# Patient Record
Sex: Male | Born: 1935 | Race: White | Hispanic: No | Marital: Married | Smoking: Former smoker
Health system: Southern US, Community
[De-identification: ages and names within clinical notes are randomized; demographics above are authoritative.]

## PROBLEM LIST (undated history)

## (undated) DIAGNOSIS — G4730 Sleep apnea, unspecified: Secondary | ICD-10-CM

## (undated) DIAGNOSIS — I2510 Atherosclerotic heart disease of native coronary artery without angina pectoris: Secondary | ICD-10-CM

## (undated) DIAGNOSIS — H9190 Unspecified hearing loss, unspecified ear: Secondary | ICD-10-CM

## (undated) DIAGNOSIS — T8859XA Other complications of anesthesia, initial encounter: Secondary | ICD-10-CM

## (undated) DIAGNOSIS — M8448XA Pathological fracture, other site, initial encounter for fracture: Secondary | ICD-10-CM

## (undated) DIAGNOSIS — C9000 Multiple myeloma not having achieved remission: Secondary | ICD-10-CM

## (undated) DIAGNOSIS — I6529 Occlusion and stenosis of unspecified carotid artery: Secondary | ICD-10-CM

## (undated) DIAGNOSIS — T4145XA Adverse effect of unspecified anesthetic, initial encounter: Secondary | ICD-10-CM

## (undated) DIAGNOSIS — F341 Dysthymic disorder: Secondary | ICD-10-CM

## (undated) DIAGNOSIS — I10 Essential (primary) hypertension: Secondary | ICD-10-CM

## (undated) DIAGNOSIS — E119 Type 2 diabetes mellitus without complications: Secondary | ICD-10-CM

## (undated) DIAGNOSIS — J189 Pneumonia, unspecified organism: Secondary | ICD-10-CM

## (undated) DIAGNOSIS — N200 Calculus of kidney: Secondary | ICD-10-CM

## (undated) DIAGNOSIS — L719 Rosacea, unspecified: Secondary | ICD-10-CM

## (undated) DIAGNOSIS — M109 Gout, unspecified: Secondary | ICD-10-CM

## (undated) HISTORY — PX: APPENDECTOMY: SHX54

## (undated) HISTORY — PX: BACK SURGERY: SHX140

## (undated) HISTORY — DX: Multiple myeloma not having achieved remission: C90.00

## (undated) HISTORY — DX: Pathological fracture, other site, initial encounter for fracture: M84.48XA

## (undated) HISTORY — PX: COLONOSCOPY: SHX174

## (undated) HISTORY — PX: CATARACT EXTRACTION: SUR2

## (undated) HISTORY — PX: CARDIAC CATHETERIZATION: SHX172

## (undated) HISTORY — PX: COLONOSCOPY W/ BIOPSIES AND POLYPECTOMY: SHX1376

## (undated) HISTORY — DX: Dysthymic disorder: F34.1

## (undated) HISTORY — DX: Type 2 diabetes mellitus without complications: E11.9

## (undated) HISTORY — PX: TONSILLECTOMY: SUR1361

## (undated) HISTORY — DX: Atherosclerotic heart disease of native coronary artery without angina pectoris: I25.10

## (undated) HISTORY — DX: Occlusion and stenosis of unspecified carotid artery: I65.29

## (undated) HISTORY — DX: Rosacea, unspecified: L71.9

---

## 1961-01-28 HISTORY — PX: HERNIA REPAIR: SHX51

## 1990-01-28 HISTORY — PX: ELBOW SURGERY: SHX618

## 1997-10-13 ENCOUNTER — Encounter: Payer: Self-pay | Admitting: Emergency Medicine

## 1997-10-13 ENCOUNTER — Emergency Department (HOSPITAL_COMMUNITY): Admission: EM | Admit: 1997-10-13 | Discharge: 1997-10-13 | Payer: Self-pay | Admitting: Emergency Medicine

## 1997-10-14 ENCOUNTER — Ambulatory Visit (HOSPITAL_COMMUNITY): Admission: RE | Admit: 1997-10-14 | Discharge: 1997-10-14 | Payer: Self-pay | Admitting: Emergency Medicine

## 2001-01-28 HISTORY — PX: ROTATOR CUFF REPAIR: SHX139

## 2001-07-27 ENCOUNTER — Ambulatory Visit (HOSPITAL_COMMUNITY): Admission: RE | Admit: 2001-07-27 | Discharge: 2001-07-27 | Payer: Self-pay | Admitting: Orthopedic Surgery

## 2001-07-27 ENCOUNTER — Encounter: Payer: Self-pay | Admitting: Orthopedic Surgery

## 2001-09-23 ENCOUNTER — Encounter: Payer: Self-pay | Admitting: Orthopedic Surgery

## 2001-09-30 ENCOUNTER — Observation Stay (HOSPITAL_COMMUNITY): Admission: RE | Admit: 2001-09-30 | Discharge: 2001-10-01 | Payer: Self-pay | Admitting: Orthopedic Surgery

## 2001-12-16 ENCOUNTER — Ambulatory Visit (HOSPITAL_COMMUNITY): Admission: RE | Admit: 2001-12-16 | Discharge: 2001-12-16 | Payer: Self-pay | Admitting: Gastroenterology

## 2001-12-16 ENCOUNTER — Encounter (INDEPENDENT_AMBULATORY_CARE_PROVIDER_SITE_OTHER): Payer: Self-pay | Admitting: Specialist

## 2002-01-28 HISTORY — PX: CAROTID ARTERY - SUBCLAVIAN ARTERY BYPASS GRAFT: SUR178

## 2002-08-10 ENCOUNTER — Encounter: Payer: Self-pay | Admitting: Vascular Surgery

## 2002-08-12 ENCOUNTER — Inpatient Hospital Stay (HOSPITAL_COMMUNITY): Admission: RE | Admit: 2002-08-12 | Discharge: 2002-08-13 | Payer: Self-pay | Admitting: Vascular Surgery

## 2002-08-12 ENCOUNTER — Encounter: Payer: Self-pay | Admitting: Vascular Surgery

## 2002-08-12 ENCOUNTER — Encounter (INDEPENDENT_AMBULATORY_CARE_PROVIDER_SITE_OTHER): Payer: Self-pay | Admitting: *Deleted

## 2002-09-27 ENCOUNTER — Encounter: Payer: Self-pay | Admitting: Physical Medicine and Rehabilitation

## 2002-09-27 ENCOUNTER — Encounter
Admission: RE | Admit: 2002-09-27 | Discharge: 2002-09-27 | Payer: Self-pay | Admitting: Physical Medicine and Rehabilitation

## 2003-07-19 ENCOUNTER — Ambulatory Visit (HOSPITAL_COMMUNITY): Admission: RE | Admit: 2003-07-19 | Discharge: 2003-07-19 | Payer: Self-pay | Admitting: Specialist

## 2004-01-29 HISTORY — PX: JOINT REPLACEMENT: SHX530

## 2005-05-29 ENCOUNTER — Encounter: Payer: Self-pay | Admitting: Emergency Medicine

## 2005-07-03 ENCOUNTER — Inpatient Hospital Stay (HOSPITAL_COMMUNITY): Admission: RE | Admit: 2005-07-03 | Discharge: 2005-07-08 | Payer: Self-pay | Admitting: Orthopedic Surgery

## 2005-07-04 ENCOUNTER — Ambulatory Visit: Payer: Self-pay | Admitting: Physical Medicine & Rehabilitation

## 2006-09-03 ENCOUNTER — Ambulatory Visit: Payer: Self-pay | Admitting: Internal Medicine

## 2007-09-18 ENCOUNTER — Encounter: Admission: RE | Admit: 2007-09-18 | Discharge: 2007-09-18 | Payer: Self-pay | Admitting: Orthopedic Surgery

## 2007-10-29 ENCOUNTER — Inpatient Hospital Stay (HOSPITAL_COMMUNITY): Admission: RE | Admit: 2007-10-29 | Discharge: 2007-10-31 | Payer: Self-pay | Admitting: Orthopedic Surgery

## 2008-10-06 ENCOUNTER — Encounter: Admission: RE | Admit: 2008-10-06 | Discharge: 2008-10-06 | Payer: Self-pay | Admitting: Orthopedic Surgery

## 2009-07-18 ENCOUNTER — Emergency Department (HOSPITAL_COMMUNITY): Admission: EM | Admit: 2009-07-18 | Discharge: 2009-07-18 | Payer: Self-pay | Admitting: Emergency Medicine

## 2009-07-28 ENCOUNTER — Inpatient Hospital Stay (HOSPITAL_COMMUNITY): Admission: AD | Admit: 2009-07-28 | Discharge: 2009-08-03 | Payer: Self-pay | Admitting: Orthopedic Surgery

## 2009-09-09 ENCOUNTER — Emergency Department (HOSPITAL_COMMUNITY)
Admission: EM | Admit: 2009-09-09 | Discharge: 2009-09-10 | Payer: Self-pay | Source: Home / Self Care | Admitting: Emergency Medicine

## 2009-09-11 ENCOUNTER — Encounter: Admission: RE | Admit: 2009-09-11 | Discharge: 2009-09-11 | Payer: Self-pay | Admitting: Orthopedic Surgery

## 2009-09-14 ENCOUNTER — Encounter: Admission: RE | Admit: 2009-09-14 | Discharge: 2009-09-14 | Payer: Self-pay | Admitting: Orthopedic Surgery

## 2009-09-26 ENCOUNTER — Ambulatory Visit: Payer: Self-pay | Admitting: Oncology

## 2009-10-13 LAB — CBC WITH DIFFERENTIAL/PLATELET
LYMPH%: 25.4 % (ref 14.0–49.0)
MCHC: 34.2 g/dL (ref 32.0–36.0)
MCV: 95.1 fL (ref 79.3–98.0)
MONO%: 7.7 % (ref 0.0–14.0)
Platelets: 170 10*3/uL (ref 140–400)
RDW: 15.2 % — ABNORMAL HIGH (ref 11.0–14.6)

## 2009-10-17 LAB — SPEP & IFE WITH QIG
Albumin ELP: 54.3 % — ABNORMAL LOW (ref 55.8–66.1)
Alpha-1-Globulin: 7 % — ABNORMAL HIGH (ref 2.9–4.9)
Alpha-2-Globulin: 14.5 % — ABNORMAL HIGH (ref 7.1–11.8)
Beta 2: 2.8 % — ABNORMAL LOW (ref 3.2–6.5)
Beta Globulin: 5.8 % (ref 4.7–7.2)
Gamma Globulin: 15.6 % (ref 11.1–18.8)
IgA: 47 mg/dL — ABNORMAL LOW (ref 68–378)
IgG (Immunoglobin G), Serum: 1290 mg/dL (ref 694–1618)
IgM, Serum: 29 mg/dL — ABNORMAL LOW (ref 60–263)
M-Spike, %: 0.65 g/dL
Total Protein, Serum Electrophoresis: 6.2 g/dL (ref 6.0–8.3)

## 2009-10-17 LAB — COMPREHENSIVE METABOLIC PANEL
ALT: 13 U/L (ref 0–53)
AST: 15 U/L (ref 0–37)
Albumin: 3.7 g/dL (ref 3.5–5.2)
Alkaline Phosphatase: 80 U/L (ref 39–117)
BUN: 21 mg/dL (ref 6–23)
CO2: 26 mEq/L (ref 19–32)
Calcium: 9.9 mg/dL (ref 8.4–10.5)
Chloride: 104 mEq/L (ref 96–112)
Creatinine, Ser: 0.83 mg/dL (ref 0.40–1.50)
Glucose, Bld: 131 mg/dL — ABNORMAL HIGH (ref 70–99)
Potassium: 3.9 mEq/L (ref 3.5–5.3)
Sodium: 141 mEq/L (ref 135–145)
Total Bilirubin: 0.5 mg/dL (ref 0.3–1.2)
Total Protein: 6.2 g/dL (ref 6.0–8.3)

## 2009-10-17 LAB — KAPPA/LAMBDA LIGHT CHAINS
Kappa free light chain: 0.81 mg/dL (ref 0.33–1.94)
Kappa:Lambda Ratio: 1.21 (ref 0.26–1.65)

## 2009-10-17 LAB — LACTATE DEHYDROGENASE: LDH: 104 U/L (ref 94–250)

## 2009-10-27 ENCOUNTER — Ambulatory Visit: Payer: Self-pay | Admitting: Oncology

## 2009-10-27 ENCOUNTER — Encounter: Payer: Self-pay | Admitting: Internal Medicine

## 2010-01-24 ENCOUNTER — Ambulatory Visit: Payer: Self-pay | Admitting: Oncology

## 2010-01-28 HISTORY — PX: CORONARY ARTERY BYPASS GRAFT: SHX141

## 2010-01-30 ENCOUNTER — Ambulatory Visit: Payer: Self-pay | Admitting: Oncology

## 2010-01-30 ENCOUNTER — Encounter
Admission: RE | Admit: 2010-01-30 | Discharge: 2010-01-30 | Payer: Self-pay | Source: Home / Self Care | Attending: Oncology | Admitting: Oncology

## 2010-01-30 LAB — CBC WITH DIFFERENTIAL/PLATELET
Basophils Absolute: 0.1 10*3/uL (ref 0.0–0.1)
EOS%: 1.9 % (ref 0.0–7.0)
Eosinophils Absolute: 0.1 10*3/uL (ref 0.0–0.5)
HCT: 43.8 % (ref 38.4–49.9)
LYMPH%: 21.4 % (ref 14.0–49.0)
MCH: 32.2 pg (ref 27.2–33.4)
MCV: 93.8 fL (ref 79.3–98.0)
MONO%: 8 % (ref 0.0–14.0)
Platelets: 189 10*3/uL (ref 140–400)
lymph#: 1.6 10*3/uL (ref 0.9–3.3)

## 2010-01-30 LAB — COMPREHENSIVE METABOLIC PANEL
AST: 25 U/L (ref 0–37)
Albumin: 3.5 g/dL (ref 3.5–5.2)
Alkaline Phosphatase: 72 U/L (ref 39–117)
BUN: 14 mg/dL (ref 6–23)
Creatinine, Ser: 0.91 mg/dL (ref 0.40–1.50)
Potassium: 4.1 mEq/L (ref 3.5–5.3)
Total Bilirubin: 0.7 mg/dL (ref 0.3–1.2)
Total Protein: 6.8 g/dL (ref 6.0–8.3)

## 2010-02-01 LAB — SPEP & IFE WITH QIG
Albumin ELP: 54.9 % — ABNORMAL LOW (ref 55.8–66.1)
Alpha-1-Globulin: 3.8 % (ref 2.9–4.9)
Alpha-2-Globulin: 12 % — ABNORMAL HIGH (ref 7.1–11.8)
Beta 2: 3.7 % (ref 3.2–6.5)
Beta Globulin: 5.4 % (ref 4.7–7.2)
Gamma Globulin: 20.2 % — ABNORMAL HIGH (ref 11.1–18.8)
IgA: 55 mg/dL — ABNORMAL LOW (ref 68–378)
IgG (Immunoglobin G), Serum: 1620 mg/dL — ABNORMAL HIGH (ref 694–1618)
IgM, Serum: 28 mg/dL — ABNORMAL LOW (ref 60–263)
M-Spike, %: 0.84 g/dL
Total Protein, Serum Electrophoresis: 7.1 g/dL (ref 6.0–8.3)

## 2010-02-01 LAB — KAPPA/LAMBDA LIGHT CHAINS
Kappa free light chain: 1.16 mg/dL (ref 0.33–1.94)
Lambda Free Lght Chn: <0.47 mg/dL — ABNORMAL LOW (ref 0.57–2.63)

## 2010-02-01 LAB — URIC ACID: Uric Acid, Serum: 5.2 mg/dL (ref 4.0–7.8)

## 2010-02-01 LAB — LACTATE DEHYDROGENASE: LDH: 149 U/L (ref 94–250)

## 2010-02-13 ENCOUNTER — Ambulatory Visit (HOSPITAL_COMMUNITY)
Admission: RE | Admit: 2010-02-13 | Discharge: 2010-02-13 | Payer: Self-pay | Source: Home / Self Care | Attending: Oncology | Admitting: Oncology

## 2010-02-14 LAB — POCT I-STAT, CHEM 8
BUN: 24 mg/dL — ABNORMAL HIGH (ref 6–23)
Calcium, Ion: 1.04 mmol/L — ABNORMAL LOW (ref 1.12–1.32)
Chloride: 104 mEq/L (ref 96–112)
Creatinine, Ser: 0.9 mg/dL (ref 0.4–1.5)
Glucose, Bld: 116 mg/dL — ABNORMAL HIGH (ref 70–99)
HCT: 47 % (ref 39.0–52.0)
Hemoglobin: 16 g/dL (ref 13.0–17.0)
Potassium: 4.3 mEq/L (ref 3.5–5.1)
Sodium: 137 mEq/L (ref 135–145)
TCO2: 30 mmol/L (ref 0–100)

## 2010-02-14 LAB — CBC
HCT: 45.1 % (ref 39.0–52.0)
Hemoglobin: 15.7 g/dL (ref 13.0–17.0)
MCH: 31.7 pg (ref 26.0–34.0)
MCHC: 34.8 g/dL (ref 30.0–36.0)
MCV: 91.1 fL (ref 78.0–100.0)
Platelets: 176 10*3/uL (ref 150–400)
RBC: 4.95 MIL/uL (ref 4.22–5.81)
RDW: 14 % (ref 11.5–15.5)
WBC: 5.7 10*3/uL (ref 4.0–10.5)

## 2010-02-14 LAB — PROTIME-INR
INR: 0.93 (ref 0.00–1.49)
Prothrombin Time: 12.7 seconds (ref 11.6–15.2)

## 2010-02-14 LAB — APTT: aPTT: 25 seconds (ref 24–37)

## 2010-02-22 ENCOUNTER — Other Ambulatory Visit: Payer: Self-pay | Admitting: Oncology

## 2010-02-22 DIAGNOSIS — D472 Monoclonal gammopathy: Secondary | ICD-10-CM

## 2010-02-27 NOTE — Letter (Signed)
Summary: [facility]  [facility]   Imported By: Lennie Odor 11/20/2009 15:39:16  _____________________________________________________________________  External Attachment:    Type:   Image     Comment:   External Document

## 2010-03-16 ENCOUNTER — Ambulatory Visit
Admission: RE | Admit: 2010-03-16 | Discharge: 2010-03-16 | Disposition: A | Discharge status: Home / Self Care | Payer: BC Managed Care – PPO | Source: Ambulatory Visit | Attending: Family Medicine | Admitting: Family Medicine

## 2010-03-16 ENCOUNTER — Other Ambulatory Visit: Payer: Self-pay | Admitting: Family Medicine

## 2010-03-16 DIAGNOSIS — R2230 Localized swelling, mass and lump, unspecified upper limb: Secondary | ICD-10-CM

## 2010-04-05 ENCOUNTER — Inpatient Hospital Stay (HOSPITAL_COMMUNITY)
Admission: RE | Admit: 2010-04-05 | Discharge: 2010-04-13 | DRG: 234 | Disposition: A | Discharge status: Home / Self Care | Payer: Medicare Other | Source: Ambulatory Visit | Attending: Surgery | Admitting: Surgery

## 2010-04-05 DIAGNOSIS — E039 Hypothyroidism, unspecified: Secondary | ICD-10-CM | POA: Diagnosis present

## 2010-04-05 DIAGNOSIS — I200 Unstable angina: Secondary | ICD-10-CM | POA: Diagnosis present

## 2010-04-05 DIAGNOSIS — M1990 Unspecified osteoarthritis, unspecified site: Secondary | ICD-10-CM | POA: Diagnosis present

## 2010-04-05 DIAGNOSIS — F3289 Other specified depressive episodes: Secondary | ICD-10-CM | POA: Diagnosis present

## 2010-04-05 DIAGNOSIS — Z79899 Other long term (current) drug therapy: Secondary | ICD-10-CM

## 2010-04-05 DIAGNOSIS — J9819 Other pulmonary collapse: Secondary | ICD-10-CM | POA: Diagnosis not present

## 2010-04-05 DIAGNOSIS — Z96659 Presence of unspecified artificial knee joint: Secondary | ICD-10-CM

## 2010-04-05 DIAGNOSIS — I2510 Atherosclerotic heart disease of native coronary artery without angina pectoris: Secondary | ICD-10-CM

## 2010-04-05 DIAGNOSIS — Z8249 Family history of ischemic heart disease and other diseases of the circulatory system: Secondary | ICD-10-CM

## 2010-04-05 DIAGNOSIS — I10 Essential (primary) hypertension: Secondary | ICD-10-CM | POA: Diagnosis present

## 2010-04-05 DIAGNOSIS — D62 Acute posthemorrhagic anemia: Secondary | ICD-10-CM | POA: Diagnosis not present

## 2010-04-05 DIAGNOSIS — I708 Atherosclerosis of other arteries: Secondary | ICD-10-CM | POA: Diagnosis present

## 2010-04-05 DIAGNOSIS — E8779 Other fluid overload: Secondary | ICD-10-CM | POA: Diagnosis not present

## 2010-04-05 DIAGNOSIS — F329 Major depressive disorder, single episode, unspecified: Secondary | ICD-10-CM | POA: Diagnosis present

## 2010-04-05 DIAGNOSIS — G4733 Obstructive sleep apnea (adult) (pediatric): Secondary | ICD-10-CM | POA: Diagnosis present

## 2010-04-05 DIAGNOSIS — Z7982 Long term (current) use of aspirin: Secondary | ICD-10-CM

## 2010-04-05 LAB — POCT ACTIVATED CLOTTING TIME: Activated Clotting Time: 199 seconds

## 2010-04-06 ENCOUNTER — Inpatient Hospital Stay (HOSPITAL_COMMUNITY): Payer: Medicare Other

## 2010-04-06 DIAGNOSIS — Z01810 Encounter for preprocedural cardiovascular examination: Secondary | ICD-10-CM

## 2010-04-06 DIAGNOSIS — I2510 Atherosclerotic heart disease of native coronary artery without angina pectoris: Secondary | ICD-10-CM

## 2010-04-06 LAB — BLOOD GAS, ARTERIAL
Bicarbonate: 27.2 mEq/L — ABNORMAL HIGH (ref 20.0–24.0)
FIO2: 0.21 %
Patient temperature: 98.6
pCO2 arterial: 42.6 mmHg (ref 35.0–45.0)
pO2, Arterial: 71.9 mmHg — ABNORMAL LOW (ref 80.0–100.0)

## 2010-04-06 LAB — COMPREHENSIVE METABOLIC PANEL
ALT: 19 U/L (ref 0–53)
AST: 23 U/L (ref 0–37)
Albumin: 3.9 g/dL (ref 3.5–5.2)
Alkaline Phosphatase: 64 U/L (ref 39–117)
BUN: 17 mg/dL (ref 6–23)
CO2: 28 mEq/L (ref 19–32)
Calcium: 9.3 mg/dL (ref 8.4–10.5)
Chloride: 99 mEq/L (ref 96–112)
Creatinine, Ser: 0.89 mg/dL (ref 0.4–1.5)
GFR calc Af Amer: >60 mL/min (ref >60)
GFR calc non Af Amer: >60 mL/min (ref >60)
Glucose, Bld: 130 mg/dL — ABNORMAL HIGH (ref 70–99)
Potassium: 4 mEq/L (ref 3.5–5.1)
Sodium: 134 mEq/L — ABNORMAL LOW (ref 135–145)
Total Bilirubin: 0.8 mg/dL (ref 0.3–1.2)
Total Protein: 7.5 g/dL (ref 6.0–8.3)

## 2010-04-06 LAB — ABO/RH: ABO/RH(D): A POS

## 2010-04-06 LAB — SURGICAL PCR SCREEN
MRSA, PCR: NEGATIVE
Staphylococcus aureus: NEGATIVE

## 2010-04-06 LAB — BASIC METABOLIC PANEL
BUN: 15 mg/dL (ref 6–23)
CO2: 30 mEq/L (ref 19–32)
Calcium: 8.9 mg/dL (ref 8.4–10.5)
Creatinine, Ser: 1 mg/dL (ref 0.4–1.5)
GFR calc Af Amer: >60 mL/min (ref >60)
GFR calc non Af Amer: >60 mL/min (ref >60)
Sodium: 137 mEq/L (ref 135–145)

## 2010-04-06 LAB — URINALYSIS, MICROSCOPIC ONLY
Ketones, ur: NEGATIVE mg/dL
Leukocytes, UA: NEGATIVE
Nitrite: NEGATIVE
Protein, ur: NEGATIVE mg/dL
Urobilinogen, UA: 0.2 mg/dL (ref 0.0–1.0)
pH: 7.5 (ref 5.0–8.0)

## 2010-04-06 LAB — TYPE AND SCREEN: ABO/RH(D): A POS

## 2010-04-06 LAB — CBC: Platelets: 144 10*3/uL — ABNORMAL LOW (ref 150–400)

## 2010-04-06 LAB — PROTIME-INR
INR: 0.97 (ref 0.00–1.49)
Prothrombin Time: 13.1 seconds (ref 11.6–15.2)

## 2010-04-06 LAB — HEMOGLOBIN A1C: Hgb A1c MFr Bld: 6 % — ABNORMAL HIGH (ref <5.7)

## 2010-04-06 LAB — APTT: aPTT: 26 seconds (ref 24–37)

## 2010-04-07 LAB — CBC
Hemoglobin: 15.5 g/dL (ref 13.0–17.0)
MCH: 32 pg (ref 26.0–34.0)
MCHC: 35.3 g/dL (ref 30.0–36.0)

## 2010-04-07 LAB — LIPID PANEL
Cholesterol: 111 mg/dL (ref 0–200)
Total CHOL/HDL Ratio: 3.4 RATIO
VLDL: 31 mg/dL (ref 0–40)

## 2010-04-07 LAB — HEPARIN LEVEL (UNFRACTIONATED)
Heparin Unfractionated: <0.1 IU/mL — ABNORMAL LOW (ref 0.30–0.70)
Heparin Unfractionated: 0.24 IU/mL — ABNORMAL LOW (ref 0.30–0.70)
Heparin Unfractionated: 1.2 IU/mL — ABNORMAL HIGH (ref 0.30–0.70)

## 2010-04-08 LAB — CBC
HCT: 45.6 % (ref 39.0–52.0)
Hemoglobin: 15.7 g/dL (ref 13.0–17.0)
MCH: 30.8 pg (ref 26.0–34.0)
MCHC: 34.4 g/dL (ref 30.0–36.0)
MCV: 89.6 fL (ref 78.0–100.0)
Platelets: 151 10*3/uL (ref 150–400)
RBC: 5.09 MIL/uL (ref 4.22–5.81)
RDW: 14.1 % (ref 11.5–15.5)
WBC: 8.3 10*3/uL (ref 4.0–10.5)

## 2010-04-08 LAB — HEPARIN LEVEL (UNFRACTIONATED)
Heparin Unfractionated: 1.08 IU/mL — ABNORMAL HIGH (ref 0.30–0.70)
Heparin Unfractionated: 1.15 IU/mL — ABNORMAL HIGH (ref 0.30–0.70)

## 2010-04-09 ENCOUNTER — Inpatient Hospital Stay (HOSPITAL_COMMUNITY): Payer: Medicare Other

## 2010-04-09 DIAGNOSIS — I2510 Atherosclerotic heart disease of native coronary artery without angina pectoris: Secondary | ICD-10-CM

## 2010-04-09 LAB — POCT I-STAT GLUCOSE: Operator id: 286331

## 2010-04-09 LAB — POCT I-STAT 4, (NA,K, GLUC, HGB,HCT)
Glucose, Bld: 107 mg/dL — ABNORMAL HIGH (ref 70–99)
Glucose, Bld: 132 mg/dL — ABNORMAL HIGH (ref 70–99)
Glucose, Bld: 133 mg/dL — ABNORMAL HIGH (ref 70–99)
HCT: 42 % (ref 39.0–52.0)
HCT: 31 % — ABNORMAL LOW (ref 39.0–52.0)
HCT: 34 % — ABNORMAL LOW (ref 39.0–52.0)
HCT: 35 % — ABNORMAL LOW (ref 39.0–52.0)
Hemoglobin: 14.3 g/dL (ref 13.0–17.0)
Hemoglobin: 10.5 g/dL — ABNORMAL LOW (ref 13.0–17.0)
Hemoglobin: 11.6 g/dL — ABNORMAL LOW (ref 13.0–17.0)
Hemoglobin: 13.3 g/dL (ref 13.0–17.0)
Potassium: 4.6 mEq/L (ref 3.5–5.1)
Potassium: 4 mEq/L (ref 3.5–5.1)
Potassium: 4.9 mEq/L (ref 3.5–5.1)
Potassium: 4.3 mEq/L (ref 3.5–5.1)
Potassium: 3.9 mEq/L (ref 3.5–5.1)
Sodium: 138 mEq/L (ref 135–145)
Sodium: 138 mEq/L (ref 135–145)
Sodium: 136 mEq/L (ref 135–145)
Sodium: 136 mEq/L (ref 135–145)
Sodium: 136 mEq/L (ref 135–145)
Sodium: 135 mEq/L (ref 135–145)
Sodium: 137 mEq/L (ref 135–145)

## 2010-04-09 LAB — BASIC METABOLIC PANEL
BUN: 20 mg/dL (ref 6–23)
Creatinine, Ser: 1.2 mg/dL (ref 0.4–1.5)
GFR calc non Af Amer: 59 mL/min — ABNORMAL LOW (ref >60)
Glucose, Bld: 119 mg/dL — ABNORMAL HIGH (ref 70–99)

## 2010-04-09 LAB — POCT I-STAT 3, ART BLOOD GAS (G3+)
Acid-base deficit: 1 mmol/L (ref 0.0–2.0)
Acid-base deficit: 1 mmol/L (ref 0.0–2.0)
Acid-base deficit: 3 mmol/L — ABNORMAL HIGH (ref 0.0–2.0)
Bicarbonate: 27.9 mEq/L — ABNORMAL HIGH (ref 20.0–24.0)
Bicarbonate: 23.6 mEq/L (ref 20.0–24.0)
Bicarbonate: 23 mEq/L (ref 20.0–24.0)
O2 Saturation: 100 %
O2 Saturation: 100 %
O2 Saturation: 100 %
Patient temperature: 35.2
TCO2: 23 mmol/L (ref 0–100)
TCO2: 25 mmol/L (ref 0–100)
TCO2: 24 mmol/L (ref 0–100)
pCO2 arterial: 35.8 mmHg (ref 35.0–45.0)
pCO2 arterial: 31.2 mmHg — ABNORMAL LOW (ref 35.0–45.0)
pO2, Arterial: 293 mmHg — ABNORMAL HIGH (ref 80.0–100.0)
pO2, Arterial: 66 mmHg — ABNORMAL LOW (ref 80.0–100.0)
pO2, Arterial: 82 mmHg (ref 80.0–100.0)

## 2010-04-09 LAB — CBC
HCT: 37.8 % — ABNORMAL LOW (ref 39.0–52.0)
Hemoglobin: 13.2 g/dL (ref 13.0–17.0)
MCH: 31.4 pg (ref 26.0–34.0)
MCH: 31.6 pg (ref 26.0–34.0)
MCHC: 35.3 g/dL (ref 30.0–36.0)
Platelets: 115 10*3/uL — ABNORMAL LOW (ref 150–400)
Platelets: 120 10*3/uL — ABNORMAL LOW (ref 150–400)
Platelets: 164 10*3/uL (ref 150–400)
RBC: 4.2 MIL/uL — ABNORMAL LOW (ref 4.22–5.81)
RBC: 4.26 MIL/uL (ref 4.22–5.81)
RDW: 14.1 % (ref 11.5–15.5)
WBC: 12.7 10*3/uL — ABNORMAL HIGH (ref 4.0–10.5)
WBC: 11.2 10*3/uL — ABNORMAL HIGH (ref 4.0–10.5)

## 2010-04-09 LAB — POCT I-STAT, CHEM 8
Calcium, Ion: 1.14 mmol/L (ref 1.12–1.32)
Glucose, Bld: 144 mg/dL — ABNORMAL HIGH (ref 70–99)
HCT: 40 % (ref 39.0–52.0)
Hemoglobin: 13.6 g/dL (ref 13.0–17.0)

## 2010-04-09 LAB — HEPARIN LEVEL (UNFRACTIONATED): Heparin Unfractionated: 0.58 IU/mL (ref 0.30–0.70)

## 2010-04-09 LAB — PROTIME-INR
INR: 1.19 (ref 0.00–1.49)
Prothrombin Time: 15.3 seconds — ABNORMAL HIGH (ref 11.6–15.2)

## 2010-04-09 LAB — CREATININE, SERUM
Creatinine, Ser: 0.87 mg/dL (ref 0.4–1.5)
GFR calc non Af Amer: >60 mL/min (ref >60)

## 2010-04-09 LAB — MAGNESIUM: Magnesium: 3 mg/dL — ABNORMAL HIGH (ref 1.5–2.5)

## 2010-04-09 LAB — GLUCOSE, CAPILLARY: Glucose-Capillary: 144 mg/dL — ABNORMAL HIGH (ref 70–99)

## 2010-04-09 LAB — APTT: aPTT: 30 seconds (ref 24–37)

## 2010-04-09 LAB — PLATELET COUNT: Platelets: 141 10*3/uL — ABNORMAL LOW (ref 150–400)

## 2010-04-09 LAB — SURGICAL PCR SCREEN: Staphylococcus aureus: NEGATIVE

## 2010-04-09 LAB — HEMOGLOBIN AND HEMATOCRIT, BLOOD: Hemoglobin: 11.9 g/dL — ABNORMAL LOW (ref 13.0–17.0)

## 2010-04-10 ENCOUNTER — Inpatient Hospital Stay (HOSPITAL_COMMUNITY): Payer: Medicare Other

## 2010-04-10 LAB — MAGNESIUM: Magnesium: 2.3 mg/dL (ref 1.5–2.5)

## 2010-04-10 LAB — CBC
MCH: 31.4 pg (ref 26.0–34.0)
MCH: 30.4 pg (ref 26.0–34.0)
MCHC: 34.9 g/dL (ref 30.0–36.0)
MCHC: 33.6 g/dL (ref 30.0–36.0)
Platelets: 125 10*3/uL — ABNORMAL LOW (ref 150–400)
Platelets: 110 10*3/uL — ABNORMAL LOW (ref 150–400)
RBC: 4.11 MIL/uL — ABNORMAL LOW (ref 4.22–5.81)
RDW: 14.3 % (ref 11.5–15.5)

## 2010-04-10 LAB — BASIC METABOLIC PANEL
CO2: 24 mEq/L (ref 19–32)
CO2: 26 mEq/L (ref 19–32)
Calcium: 7.5 mg/dL — ABNORMAL LOW (ref 8.4–10.5)
Chloride: 102 mEq/L (ref 96–112)
Creatinine, Ser: 0.87 mg/dL (ref 0.4–1.5)
GFR calc Af Amer: >60 mL/min (ref >60)
GFR calc Af Amer: >60 mL/min (ref >60)
GFR calc non Af Amer: >60 mL/min (ref >60)
Sodium: 135 mEq/L (ref 135–145)

## 2010-04-10 LAB — GLUCOSE, CAPILLARY
Glucose-Capillary: 103 mg/dL — ABNORMAL HIGH (ref 70–99)
Glucose-Capillary: 144 mg/dL — ABNORMAL HIGH (ref 70–99)
Glucose-Capillary: 146 mg/dL — ABNORMAL HIGH (ref 70–99)
Glucose-Capillary: 150 mg/dL — ABNORMAL HIGH (ref 70–99)
Glucose-Capillary: 172 mg/dL — ABNORMAL HIGH (ref 70–99)
Glucose-Capillary: 179 mg/dL — ABNORMAL HIGH (ref 70–99)
Glucose-Capillary: 246 mg/dL — ABNORMAL HIGH (ref 70–99)
Glucose-Capillary: 92 mg/dL (ref 70–99)

## 2010-04-11 ENCOUNTER — Inpatient Hospital Stay (HOSPITAL_COMMUNITY): Payer: Medicare Other

## 2010-04-11 LAB — BASIC METABOLIC PANEL
BUN: 17 mg/dL (ref 6–23)
GFR calc Af Amer: >60 mL/min (ref >60)
GFR calc non Af Amer: >60 mL/min (ref >60)
Potassium: 4.5 mEq/L (ref 3.5–5.1)
Sodium: 137 mEq/L (ref 135–145)

## 2010-04-11 LAB — GLUCOSE, CAPILLARY
Glucose-Capillary: 102 mg/dL — ABNORMAL HIGH (ref 70–99)
Glucose-Capillary: 117 mg/dL — ABNORMAL HIGH (ref 70–99)
Glucose-Capillary: 127 mg/dL — ABNORMAL HIGH (ref 70–99)
Glucose-Capillary: 141 mg/dL — ABNORMAL HIGH (ref 70–99)
Glucose-Capillary: 148 mg/dL — ABNORMAL HIGH (ref 70–99)

## 2010-04-11 LAB — CBC
Platelets: 100 10*3/uL — ABNORMAL LOW (ref 150–400)
RDW: 14.3 % (ref 11.5–15.5)
WBC: 12.1 10*3/uL — ABNORMAL HIGH (ref 4.0–10.5)

## 2010-04-12 ENCOUNTER — Inpatient Hospital Stay (HOSPITAL_COMMUNITY): Payer: Medicare Other

## 2010-04-12 LAB — CBC
Hemoglobin: 11.6 g/dL — ABNORMAL LOW (ref 13.0–17.0)
MCH: 30.4 pg (ref 26.0–34.0)
MCHC: 33.9 g/dL (ref 30.0–36.0)

## 2010-04-12 LAB — GLUCOSE, CAPILLARY
Glucose-Capillary: 127 mg/dL — ABNORMAL HIGH (ref 70–99)
Glucose-Capillary: 149 mg/dL — ABNORMAL HIGH (ref 70–99)
Glucose-Capillary: 169 mg/dL — ABNORMAL HIGH (ref 70–99)

## 2010-04-12 LAB — BASIC METABOLIC PANEL
CO2: 29 mEq/L (ref 19–32)
Calcium: 8.7 mg/dL (ref 8.4–10.5)
Glucose, Bld: 117 mg/dL — ABNORMAL HIGH (ref 70–99)
Sodium: 135 mEq/L (ref 135–145)

## 2010-04-13 LAB — COMPREHENSIVE METABOLIC PANEL
ALT: 42 U/L (ref 0–53)
BUN: 15 mg/dL (ref 6–23)
CO2: 31 mEq/L (ref 19–32)
Calcium: 10.3 mg/dL (ref 8.4–10.5)
Creatinine, Ser: 0.96 mg/dL (ref 0.4–1.5)
GFR calc non Af Amer: >60 mL/min (ref >60)
Glucose, Bld: 134 mg/dL — ABNORMAL HIGH (ref 70–99)
Sodium: 140 mEq/L (ref 135–145)
Total Protein: 7.1 g/dL (ref 6.0–8.3)

## 2010-04-13 LAB — GLUCOSE, CAPILLARY
Glucose-Capillary: 94 mg/dL (ref 70–99)
Glucose-Capillary: 114 mg/dL — ABNORMAL HIGH (ref 70–99)

## 2010-04-13 LAB — CBC
HCT: 49.7 % (ref 39.0–52.0)
Hemoglobin: 17.1 g/dL — ABNORMAL HIGH (ref 13.0–17.0)
MCH: 33 pg (ref 26.0–34.0)
MCHC: 34.5 g/dL (ref 30.0–36.0)
RDW: 15 % (ref 11.5–15.5)

## 2010-04-13 LAB — DIFFERENTIAL
Eosinophils Absolute: 0 10*3/uL (ref 0.0–0.7)
Lymphocytes Relative: 12 % (ref 12–46)
Lymphs Abs: 1.2 10*3/uL (ref 0.7–4.0)
Monocytes Relative: 9 % (ref 3–12)
Neutro Abs: 8.1 10*3/uL — ABNORMAL HIGH (ref 1.7–7.7)
Neutrophils Relative %: 79 % — ABNORMAL HIGH (ref 43–77)

## 2010-04-13 LAB — URINALYSIS, ROUTINE W REFLEX MICROSCOPIC
Bilirubin Urine: NEGATIVE
Glucose, UA: NEGATIVE mg/dL
Ketones, ur: NEGATIVE mg/dL
Protein, ur: NEGATIVE mg/dL

## 2010-04-15 LAB — URINALYSIS, ROUTINE W REFLEX MICROSCOPIC
Bilirubin Urine: NEGATIVE
Nitrite: NEGATIVE
Nitrite: NEGATIVE
Protein, ur: 30 mg/dL — AB
Specific Gravity, Urine: 1.009 (ref 1.005–1.030)
Urobilinogen, UA: 0.2 mg/dL (ref 0.0–1.0)
Urobilinogen, UA: 1 mg/dL (ref 0.0–1.0)
pH: 7.5 (ref 5.0–8.0)

## 2010-04-15 LAB — URINE CULTURE
Colony Count: NO GROWTH
Culture: NO GROWTH

## 2010-04-15 LAB — UIFE/LIGHT CHAINS/TP QN, 24-HR UR
Albumin, U: DETECTED
Beta, Urine: DETECTED — AB
Free Lambda Lt Chains,Ur: 0.1 mg/dL (ref 0.08–1.01)
Total Protein, Urine-Ur/day: 71 mg/d (ref 10–140)
Total Protein, Urine: 7.3 mg/dL

## 2010-04-15 LAB — PROTEIN ELECTROPH W RFLX QUANT IMMUNOGLOBULINS
Beta 2: 3.4 % (ref 3.2–6.5)
Beta Globulin: 6.8 % (ref 4.7–7.2)
Gamma Globulin: 17.8 % (ref 11.1–18.8)
M-Spike, %: 0.84 g/dL

## 2010-04-15 LAB — IMMUNOFIXATION ADD-ON

## 2010-04-15 LAB — COMPREHENSIVE METABOLIC PANEL
ALT: 25 U/L (ref 0–53)
AST: 25 U/L (ref 0–37)
Albumin: 3.9 g/dL (ref 3.5–5.2)
Calcium: 10 mg/dL (ref 8.4–10.5)
Chloride: 98 mEq/L (ref 96–112)
Creatinine, Ser: 0.88 mg/dL (ref 0.4–1.5)
GFR calc Af Amer: >60 mL/min (ref >60)
Sodium: 136 mEq/L (ref 135–145)
Total Bilirubin: 1.5 mg/dL — ABNORMAL HIGH (ref 0.3–1.2)

## 2010-04-15 LAB — CBC
Hemoglobin: 18 g/dL — ABNORMAL HIGH (ref 13.0–17.0)
MCH: 33.3 pg (ref 26.0–34.0)
Platelets: 186 10*3/uL (ref 150–400)
RBC: 5.41 MIL/uL (ref 4.22–5.81)

## 2010-04-15 LAB — IMMUNOFIXATION ELECTROPHORESIS: Total Protein ELP: 7.6 g/dL (ref 6.0–8.3)

## 2010-04-15 LAB — APTT: aPTT: 26 seconds (ref 24–37)

## 2010-04-15 LAB — IGG, IGA, IGM
IgA: 50 mg/dL — ABNORMAL LOW (ref 68–378)
IgM, Serum: 33 mg/dL — ABNORMAL LOW (ref 60–263)

## 2010-04-15 LAB — URINE MICROSCOPIC-ADD ON

## 2010-04-18 NOTE — Op Note (Signed)
NAME:  [name] [name], [name] [name] NO.:  [id number]  MEDICAL RECORD NO.:  [id number]           PATIENT TYPE:  I  LOCATION:  2314                         FACILITY:  [facility]  PHYSICIAN:  Evelene Croon, M.D.     DATE OF BIRTH:  [birth year]  DATE OF PROCEDURE:  04/09/2010 DATE OF DISCHARGE:                              OPERATIVE REPORT   PREOPERATIVE DIAGNOSIS:  Severe three-vessel coronary artery disease with unstable angina.  POSTOPERATIVE DIAGNOSIS:  Severe three-vessel coronary artery disease with unstable angina.  OPERATIVE PROCEDURE:  Median sternotomy, extracorporeal circulation, coronary artery bypass graft surgery x5 using a left internal mammary artery graft to the left anterior descending coronary, with a saphenous vein graft to the diagonal branch of the LAD, a sequential saphenous vein graft to the first and third obtuse marginal branches of the left circumflex coronary artery, and a saphenous vein graft to the posterior descending branch of the right coronary.  Endoscopic vein harvesting from the right leg.  ATTENDING SURGEON:  Evelene Croon, MD  ASSISTANT:  Rowe Clack, PA-C  ANESTHESIA:  General endotracheal.  CLINICAL HISTORY:  This patient is a 77 year old morbidly obese gentleman with history of hypertension, hypothyroidism, obstructive sleep apnea, and DJD as well as family history of heart disease who presents with a 6-week history of substernal chest pain, shortness of breath, and fatigue.  This was occurring initially with exertion, but has progressed and is now occurring at rest.  Catheterization showed severe multivessel coronary artery disease.  There was a ostial and proximal 80-90% LAD stenosis.  There was 80-90% mid left circumflex stenosis.  The large first marginal had 40-50% ostial stenosis.  The right coronary artery appeared to be the culprit vessel with 90% proximal and 99% mid stenosis.  Ejection fraction was 45-50%.  The patient did  have some chest pain while in hospital post catheterization and was started on heparin and nitroglycerin.  After review of the catheterization examination, the patient was felt that coronary artery bypass graft surgery was the best treatment to prevent further ischemia and infarction.  I discussed the operative procedure with the patient and his wife.  We discussed alternatives, benefits, and risks including, but not limited to bleeding, blood transfusion, infection, stroke, myocardial infarction, graft failure, and death.  He understood and agreed to proceed.  OPERATIVE PROCEDURE:  The patient was taken to the operating room and placed on table in the supine position.  After induction of general endotracheal anesthesia, a Foley catheter was placed in bladder using sterile technique.  Then the chest, abdomen and both lower extremities were prepped and draped in usual sterile manner.  Chest was entered through a median sternotomy incision and the pericardium opened midline. Examination of the heart showed good ventricular contractility.  The ascending aorta had no palpable plaques in it.  Then, the left internal mammary artery was harvested from the chest wall as a pedicle graft.  This was a medium caliber vessel with excellent blood flow through it.  At the same time, a segment of greater saphenous vein was harvested from the right leg using endoscopic vein harvest technique.  This vein was of medium size and good quality.  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  With retraction of the left side of the sternum using the sternal retractor, the posterior table of the manubrium began to crack.  I was able to relax attention adequately to prevent further cracking and still harvest the mammary artery.  Then, the patient was heparinized.  When an adequate ACT was obtained, the distal ascending aorta was cannulated using a 20-French aortic cannula for arterial inflow.  Venous outflow  was achieved using a two- stage venous cannula for the right atrial appendage.  An antegrade cardioplegia and vent cannula was inserted in aortic root.  The patient was placed on cardiopulmonary bypass and distal coronaries identified.  The LAD was a large graftable vessel with no significant distal disease in it.  The diagonal branch was a medium size graftable vessel with no distal disease in it.  The left circumflex gave off a large first marginal that was visible proximally before it became intramyocardial.  The second marginal was smaller.  The third marginal was about the same size as a second marginal and they appeared to communicate well on catheterization.  I chose a third marginal for grafting since it was a wall further away from the first marginal and the graft would sit much better.  The right coronary artery was diffusely diseased.  This extended down to the takeoff of the posterior descending branch.  The posterior descending itself appeared free of disease.  There was also a large posterolateral branch that had noticed disease in it.  Then, the aorta was crossclamped and 800 mL of cold blood antegrade cardioplegia was administered in the aortic root with quick arrest of the heart.  Systemic hypothermia to 32 degrees centigrade and topical hypothermic iced saline was used.  A temperature probe was placed in the septum and insulating pad in the pericardium.  The first distal anastomosis was performed to the posterior descending coronary artery.  The internal diameter was 1.75-mm.  The conduit used was the segment of greater saphenous vein and the anastomosis performed in the end-to-side manner using continuous 7-0 Prolene suture.  Flow was noted through the graft and was excellent.  A second distal anastomosis was performed to the first obtuse marginal branch.  The internal diameter was about 2-mm.  The conduit used was second segment of greater saphenous vein.  The  anastomosis performed in the end-to-side manner using continuous 7-0 Prolene suture.  Flow was noted through the graft and was excellent.  The third distal anastomosis was performed to the third marginal branch. The internal diameter of this vessel was about 1.6-mm.  The conduit used was the same segment of greater saphenous vein.  The anastomosis performed in the sequential end-to-side manner using continuous 7-0 Prolene suture.  Flow was noted through the graft and was excellent. Then, another dose of cardioplegia was given down the vein grafts and the aortic root.  The fourth distal anastomosis was performed to the diagonal branch.  The internal diameter was 1.6-mm.  The conduit used was a segment of greater saphenous vein.  The anastomosis performed in the end-to-side manner using continuous 7-0 Prolene suture.  Flow was noted through the graft and was excellent.  The fifth distal anastomosis was then performed to the mid LAD.  The internal diameter was about 2-mm.  The conduit used was the left internal mammary artery graft and this was brought through an opening in the left pericardium anterior to the  phrenic nerve.  It was anastomosed to the LAD in the end-to-side manner using continuous 8-0 Prolene suture.  The pedicle was sutured, the epicardium was 6-0 Prolene sutures.  The patient was then rewarmed with 37 degrees centigrade. With crossclamp in place, the 3 proximal vein graft anastomoses were performed to the mid ascending aorta in a end-to-side manner using continuous 6-0 Prolene suture.  Then, the clamp was removed from the mammary pedicle.  There is rapid warming of the ventricular septum and return of spontaneous ventricular fibrillation.  Crossclamp was removed with time of 100 minutes.  There was spontaneous return of sinus rhythm. The proximal and distal anastomoses appeared hemostatic while the grafts satisfactory.  Graft markers were placed around the  proximal anastomoses.  Two temporary right ventricular and right atrial pacing wires placed above the skin.  When the patient rewarmed to 37 degrees centigrade, he was weaned from cardiopulmonary bypass on no inotropic agents.  Total bypass time was 122 minutes.  Cardiac function appeared excellent with cardiac output of 6 L per minute.  Promit was given and the venous and aortic cannula was removed without difficulty.  Hemostasis was achieved.  Three chest tubes were placed, two in the post pericardium on the left pleural space, one in the anterior mediastinum.  The sternum was then reapproximated with double #6 stainless steel wires.  Fascia was closed with continuous #1 Vicryl suture.  Subcutaneous tissue was closed with continuous 2-0 Vicryl and the skin with a 3-0 Vicryl subcuticular closure.  The lower extremity vein harvest site was closed in layers in a similar manner. The sponge, needle, and instrument counts were correct according to the scrub nurse.  Dry sterile dressing were applied over the incisions around the chest tubes which were Pleur-Evac suction.  The patient remained hemodynamically stable and was transferred to the SICU in guarded, but stable condition.     Evelene Croon, M.D.     BB/MEDQ  D:  04/09/2010  T:  04/10/2010  Job:  [id number]  cc:   Cristy Hilts. Jacinto Halim, MD  Electronically Signed by Evelene Croon M.D. on 04/18/2010 09:57:37 AM

## 2010-04-18 NOTE — Consult Note (Signed)
NAME:  [name], [name] NO.:  [id number]  MEDICAL RECORD NO.:  [id number]           PATIENT TYPE:  I  LOCATION:  3707                         FACILITY:  [facility]  PHYSICIAN:  Evelene Croon, M.D.     DATE OF BIRTH:  1932-11-03  DATE OF CONSULTATION:  04/05/2010 DATE OF DISCHARGE:                                CONSULTATION   REFERRING PHYSICIAN:  Vonna Kotyk R. Jacinto Halim, MD  REASON FOR CONSULTATION:  Severe multivessel coronary disease with unstable angina.  CLINICAL HISTORY:  Assessed by Dr. Jacinto Halim to evaluate [name] [name] for coronary bypass graft surgeries.  He is a 77 year old gentleman with a history of hypertension, sleep apnea, hypothyroidism, and multiple prior orthopedic problems who presented with a 6-week history of substernal chest discomfort usually occurring with exertion.  His symptoms lasted from 5-15 minutes and occurring in the substernal region with radiation to the left side.  His pain was exacerbated by exertional activity and always relieved with rest.  His symptoms became more severe and he developed some chest pain at night which prompted him to seek medical attention.  His saw his primary physician Dr. Aida Puffer and was referred to Dr. Jacinto Halim for workup.  Cardiac catheterization was performed today and this showed about 30-40% ostial left main narrowing.  There was 80% ostial LAD stenosis noted by IVUS.  The LAD had about 80% proximal and 80-90% midvessel stenosis after the diagonal branch.  The diagonal branch was a moderate-sized vessel with no significant disease within it.  The LAD did have some distal narrowing of about 40 and 70% segmental stenosis present.  Left circumflex gave off a large first marginal that had 40-50% ostial stenosis.  Between this first marginal and the second marginal was 80-90% stenosis.  The second and third margins were communicating vessels with the second being slightly larger than the third.  The right coronary artery  was a large dominant vessel with 90% proximal and 99% midvessel stenosis with TIMI 2 flow.  Left ventricular ejection fraction was 45-50% with inferior and inferoseptal hypokinesis.  There was no gradient across the aortic valve and no mitral regurgitation.  REVIEW OF SYSTEMS:  GENERAL:  He denies any fever or chills.  He has had some weight loss since last summer due to a compression fracture in his spine and not eating as much.  His wife says he has gained about 12 pounds back and had lost about 40 pounds.  He does report significant fatigue over the past couple of months.  EYES:  Negative.  ENT: Negative.  ENDOCRINE:  He denies diabetes.  He does have hypothyroidism and is on replacement therapy.  CARDIOVASCULAR:  As above.  He denies PND or orthopnea.  He has had no peripheral edema or palpitations. RESPIRATORY:  Denies cough or sputum production.  He does have a history of sleep apnea and uses CPAP at night.  GI:  He denies nausea or vomiting.  Denies melena and bright red blood per rectum.  GU:  He denies dysuria and hematuria.  MUSCULOSKELETAL:  He does have a history of degenerative joint disease as noted  in his past medical history and was wearing a back brace for several months due to a lower spine compression fracture.  VASCULAR:  He denies claudication and phlebitis. NEUROLOGICAL:  He denies any focal weakness or numbness.  Denies dizziness and syncope.  Never had TIA or stroke.  ALLERGIES:  Cardizem which causes severe rash with blisters all over. He is also allergic to most NARCOTIC PAIN MEDICINES causing nausea and constipation.  PAST MEDICAL HISTORY:  Significant for hypertension and hypothyroidism. He has a history of depression.  He is status post appendectomy in 1951. Status post lower back surgery in 1984.  He is status post a left carotid endarterectomy by Dr. Waverly Ferrari in 2004.  He had an aborted cervical spine surgery in 2005 for herniated disk  with radiculopathy due to inability to intubate him.  He is status post left total knee arthroplasty in 2007 and status post decompressive lumbar laminectomy in 2009 for severe spinal stenosis at L2-3.  He had a right rotator cuff repair in 2003.  FAMILY HISTORY:  His father died at age 48 and had bypass surgery at age 74.  Mother died at age 29 with colon cancer.  SOCIAL HISTORY:  He is retired and lives with his wife of 50+ years.  He has 1 daughter.  He quit smoking over 30 years ago and denies alcohol use.  PHYSICAL EXAMINATION:  VITAL SIGNS:  Blood pressure is 130/82, pulse is 65 and regular, respiratory rate is 18 and unlabored.  He is a morbidly obese white male in no distress. HEENT:  Normocephalic and atraumatic.  Pupils are equal and reactive to light and accommodation.  Extraocular muscles are intact.  Oropharynx is clear. NECK:  There is a large panniculus of his neck.  Carotid pulses are normal bilaterally.  There is a left cervical bruit.  There is an old left carotid endarterectomy scar on the left.  There is no adenopathy or thyromegaly. CARDIAC:  Regular rate and rhythm with normal S1 and S2.  There is no murmur, rub, or gallop. LUNGS:  Clear. ABDOMEN:  Shows active bowel sounds. ABDOMEN:  Soft and obese with panniculus present.  There are no palpable masses or organomegaly. EXTREMITIES:  Shows no peripheral edema.  Pedal pulses are palpable bilaterally. SKIN:  Warm and dry.  There is an incision over the left knee from knee replacement.  IMPRESSION:  [name] [name] has severe multivessel coronary disease with unstable anginal symptoms that occurred at rest at home.  I agree that coronary artery bypass graft surgery is the best treatment for further ischemia and infarction and improve his quality of life.  He is still quite active.  I discussed the operative procedure of coronary bypass surgery with he and his wife including alternatives, benefits, and  risks including but not limited to bleeding, blood transfusion, infection, stroke, myocardial infarction, graft failure, and death.  He understands and would like to proceed with surgery.  Our operative schedule is full tomorrow and therefore I will schedule it for Monday, April 09, 2010. If he develops any chest pain symptoms at rest while in the hospital, then we may need to consider doing it as emergency over the weekend.     Evelene Croon, M.D.     BB/MEDQ  D:  04/05/2010  T:  04/06/2010  Job:  [id number]  cc:   Cristy Hilts. Jacinto Halim, MD Aida Puffer  Electronically Signed by Evelene Croon M.D. on 04/18/2010 09:57:34 AM

## 2010-04-23 NOTE — Procedures (Signed)
NAME:  [name] [name], [name] [name] NO.:  [id number]  MEDICAL RECORD NO.:  [id number]           PATIENT TYPE:  I  LOCATION:  3707                         FACILITY:  [facility]  PHYSICIAN:  Cristy Hilts. Jacinto Halim, MD       DATE OF BIRTH:  May 14, 1932  DATE OF PROCEDURE:  04/05/2010 DATE OF DISCHARGE:                           CARDIAC CATHETERIZATION   PROCEDURE PERFORMED: 1. Left ventriculography. 2. Selected right and left coronary arteriography. 3. Intravascular ultrasound interrogation of the LAD and the left main     coronary artery.  INDICATIONS:  [name] [name] is a pleasant 77 year old gentleman with known peripheral arterial disease and carotid artery disease status post left carotid endarterectomy in the remote past and history of hypertension, hyperlipidemia, morbid obesity, presents with chest discomfort.  He had undergone outpatient stress testing.  He has been using significant amount of nitroglycerin.  He underwent outpatient stress testing which revealed inferior wall scar with moderate-to-severe peri-infarct ischemia.  He is now brought to the cardiac catheterization lab to evaluate his coronary anatomy.  Because of questionable left main stenoses, intravascular ultrasound interrogation was performed.  HEMODYNAMIC DATA:  The left ventricular pressure was 122/10 with end- diastolic pressure of 19 mmHg.  Aortic pressure was 122/60 with a mean of 85 mmHg.  There was no pressure gradient across the aortic valve.  ANGIOGRAPHIC DATA: 1. Left ventricle.  Left ventricular systolic function was in the     lower limit of normal to mildly reduced with ejection fraction of     45% to 50% with inferior and inferoseptal hypokinesis. 2. Right coronary artery.  Right coronary artery was a large-caliber     vessel and dominant vessel.  It has got a mid segment tandem 90%     and a 99% stenoses.  Mid to distal segment of the right coronary     artery shows a 30% to 40% stenosis.  Large  vessel. 3. Left main coronary artery.  Left main coronary artery is a large-     caliber vessel.  Ostium of the left main appeared to have high-     grade stenosis; however, by IVUS it was only 30% to 40% stenosed     with circumferential plaque and mild-to-moderate eccentric     calcification. 4. LAD.  LAD is a large-caliber vessel.  Diffusely diseased.  Ostial     proximal LAD appeared to have 60% stenosis, but by IVUS it is at     least 80% stenosed with a measured lumen area of 2.6 sq. mm in a 4-     mm vessel.  The mid segment of the LAD showed again a tight     angiographically 90% stenosis and tandem 80% to 90% stenosis by     IVUS, this was confirmed.  This measured at the reference lumen 3     mm.  The mid-to-distal LAD has a 40% eccentric stenosis followed by     a distal LAD of 70% stenoses. 5. Circumflex coronary artery.  Circumflex coronary artery is a large-     caliber vessel giving origin to a large-size  obtuse marginal-1.     After the origin of obtuse marginal-1, the mid segment of the     circumflex shows an 80% to 90% diffuse stenosis.  Obtuse marginal-2     is small-to-moderate caliber vessel.  IMPRESSION:  Intracoronary vascular ultrasound of the left anterior descending has ostial disease and also mid tandem stenoses.  He will need at least about a 40 to 45 mm of stent if feasible.  Also, he will need revascularization to his right coronary artery and also circumflex coronary artery, either could be treated medically or by stenting. However, because of diffuse disease in his left anterior descending, he probably would benefit from coronary artery bypass grafting.  I will consult TCTS for the same.  I performed this procedure in the inpatient setting given the fact that he has got accelerated angina pectoris and continued chest pain and has been using sublingual nitroglycerin frequently 2-3 times per day.  TECHNIQUE OF THE PROCEDURE:  Under sterile precautions, I  initially used a 6-French radial access.  Because of inability to traverse through the radial artery, because of significant tortuosity in spite of use of Glidewire, I switched over to right femoral arterial access.  Right femoral arterial access was obtained and a 6-French sheath was introduced in a Seldinger technique.  The 6-French TIG 4 catheter was advanced into the ascending aorta.  Left main coronary was selectively engaged and angiography was performed.  Intracoronary nitroglycerin was also administered and angiography was performed.  Catheter then exchanged to a Judkins right four diagnostic catheter which was advanced into the left ventricle.  Left ventriculography was performed both in LAO and RAO projection.  Catheter pulled into the ascending aorta. Right coronary was selectively engaged and angiography was performed and then catheter was pulled out of the body.  TECHNIQUE:  IVUS interrogation using heparin for anticoagulation maintaining ACT around 200, I advanced an Intuition Hydrophilic guidewire was advanced into the LAD.  Galaxy IVUS catheter was advanced into the LAD and careful IVUS interrogation was performed both to the LAD and left main coronary artery.  The guidewires were withdrawn for angiography.  Guide catheter disengaged and pulled out of the body.  Right femoral arteriography was performed through the arterial access sheath.  Because of very close access to inframammary artery, we decided to hold manual pressure.  The patient tolerated the procedure well.  No evidence of complications.     Cristy Hilts. Jacinto Halim, MD     JRG/MEDQ  D:  04/05/2010  T:  04/05/2010  Job:  [id number]  cc:   [name] [name]  Electronically Signed by Yates Decamp MD on 04/23/2010 09:54:04 AM

## 2010-04-26 NOTE — Discharge Summary (Signed)
NAME:  [name] [name], HOECKER NO.:  [id number]  MEDICAL RECORD NO.:  [id number]           PATIENT TYPE:  I  LOCATION:  2007                         FACILITY:  [facility]  PHYSICIAN:  Evelene Croon, M.D.     DATE OF BIRTH:  1932/12/17  DATE OF ADMISSION:  04/05/2010 DATE OF DISCHARGE:                              DISCHARGE SUMMARY   FINAL DIAGNOSIS:  Severe three-vessel coronary artery disease with unstable angina.  IN-HOSPITAL DIAGNOSES: 1. Volume overload postoperatively. 2. Acute blood loss anemia postoperatively. 3. Hemoglobin A1c of 6.0. 4. Left subclavian artery stenosis.  SECONDARY DIAGNOSES: 1. Hypertension. 2. Hypothyroidism. 3. History of depression. 4. Status post appendectomy. 5. Status post lower back surgery. 6. Status post left carotid endarterectomy. 7. An aborted cervical spine surgery in 2005 for herniated disk with     radiculopathy due to inability to intubate the patient. 8. Status post left total knee arthroplasty. 9. Status post decompression lumbar laminectomy in 2009 for severe     spinal stenosis L2-L3. 10.Status post rotator cuff repair.  IN-HOSPITAL OPERATIONS AND PROCEDURES: 1. Cardiac catheterization done by Dr. Jacinto Halim April 05, 2010. 2. Coronary artery bypass grafting x5 using a left internal mammary     artery graft to left anterior descending coronary artery, saphenous     vein graft to diagonal branch of the LAD, sequential saphenous vein     graft to first and third obtuse marginal branch of left circumflex     coronary artery, saphenous vein graft to posterior descending     branch of the right coronary artery.  Endoscopic vein harvesting     from the right leg.  This was done by Dr. Laneta Simmers on April 09, 2010.  HISTORY AND PHYSICAL AND HOSPITAL COURSE:  The patient is a 77 year old gentleman with history of hypertension, sleep apnea, hypothyroidism, multiple prior orthopedic problems who has a 6-week history of substernal chest  discomfort usually occurring with exertion.  Symptoms usually relieved with rest.  Symptoms became more severe and he started to develop chest pain at night.  He was seen by his primary care physician, Dr. Aida Puffer, who referred the patient to Dr. Jacinto Halim.  Dr. Jacinto Halim scheduled the patient for cardiac catheterization.  This was scheduled for April 05, 2010.  For further details of the patient's past medical history and physical exam, please see dictated H and P.  The patient underwent cardiac catheterization on April 05, 2010, which showed severe three-vessel coronary artery disease.  Following cardiac catheterization, Dr. Laneta Simmers was consulted.  Dr. Laneta Simmers saw and evaluated the patient.  He discussed with the patient undergoing coronary artery bypass grafting.  He discussed risks and benefits.  The patient was scheduled for surgery for April 09, 2010.  It was discussed with the patient if he developed any chest pain at rest prior to undergoing surgery that he would perform his coronary artery bypass graft sooner. The patient acknowledges understanding, agreed to proceed. Preoperatively, the patient did have bilateral carotid duplex ultrasound done showing no evidence of significant ICA stenosis.  The patient remained stable preoperatively.  The patient was taken to the  operating room on April 09, 2010, where he underwent coronary artery bypass grafting x5 using a left internal mammary artery graft to left anterior descending coronary, saphenous vein graft to diagonal branch of the LAD, sequential saphenous vein graft to the first and third obtuse marginal branches of the left circumflex coronary artery, saphenous vein graft to posterior descending branch of the right coronary.  Endoscopic vein harvesting from the right leg was done.  The patient tolerated this procedure well and transferred to the intensive care unit in stable condition.  Postoperatively, the patient was noted to be  hemodynamically stable.  He was extubated evening of surgery.  Postextubation, the patient was noted to be alert and oriented x4, neuro intact.  Postoperatively, the patient was in normal sinus rhythm.  Blood pressure was stable.  All drips were able to be weaned and discontinued.  His beta blocker was initially held postop day #1.  By postop day #2, his blood pressure systolic was in the 160s and he was maintaining sinus in the 70s.  He was restarted on Lopressor as well as an ACE inhibitor at this time.  Vitals were followed closely. Followup chest x-ray postop day #1 was stable.  The patient had minimal drainage from chest tubes and chest tubes were discontinued in routine fashion.  The patient was encouraged to use his incentive spirometer.  A followup chest x-ray postop day #2 remained stable.  The patient was able to be weaned off oxygen with O2 saturations maintaining greater than 90% on room air.  The patient did have some mild volume overload postoperatively and was started on Lasix.  He was up ambulating well in the SICU with assistance.  He was felt to be stable and ready for transfer to 2000 postop day #2.  The patient did have some mild acute blood loss anemia and this was followed closely and it remained stable. On telemetry floor, the patient continued to progress well.  He has remained afebrile in normal sinus rhythm.  Blood pressure better controlled on ACE inhibitor and beta blocker.  He has remained off oxygen with O2 saturations maintaining greater than 90%.  Daily weights continued to be followed and his volume overload was improving.  We will plan to continue diuretics at discharge.  The patient's hemoglobin/hematocrit remained stable.  He was up ambulating well with cardiac rehab progressing well.  He was tolerating diet well.  No nausea or vomiting noted.  All incisions are clean, dry, and intact and healing well.  Postoperatively, the patient's blood sugars were  followed.  He has no history of diabetes.  His hemoglobin A1c preoperatively was 6.0. The patient is currently on 20 units of Lantus insulin.  We will plan not to discharge the patient home on any Lantus but we will have patient follow up with his primary care physician for further management.  On postop day #3, April 12, 2010, the patient noted to be progressing well.  All vital signs stable.  The patient's most recent lab work shows white blood cell count 11.3, hemoglobin of 11.6, hematocrit of 34.2, platelet count of 47.  Sodium of 135, potassium 4.2, chloride of 102, bicarbonate 29, BUN 21, creatinine 0.96, glucose of 117.  Chest x-ray on April 12, 2010, shows persistent bilateral pleural effusions, noted to be small with the left greater than the right, also some mild atelectasis.  The patient is tentatively ready for discharge to home in the next 24 to 48 hours pending he remained stable.  FOLLOWUP APPOINTMENTS:  A followup appointment has been arranged with Dr. Laneta Simmers for May 01, 2010, at 1:15 p.m.  The patient will need to obtain PMI, chest x-ray 30 minutes prior to this appointment.  He needs to follow up with Dr. Jacinto Halim in 2 weeks.  He will need to contact his office to make these arrangements.  The patient also need to follow up with Dr. Aida Puffer in 2-4 weeks for diabetes evaluation.  ACTIVITY:  The patient instructed no driving until released to do so, no heavy lifting over 10 pounds.  He is told to ambulate 3 to 4 times per day, progress as tolerated, and to continue his breathing exercises.  INCISIONAL CARE:  The patient is told to shower, washing his incisions using soap and water.  He is contact the office if he develops any drainage or opening from any of his incision sites.  DIET:  The patient educated on diet to be low-fat, low-salt.  DISCHARGE MEDICATIONS: 1. Tylenol 325 mg 2 tablets q.6 h. p.r.n. pain. 2. Lasix 40 mg daily x7 days. 3. Lisinopril 5 mg  daily. 4. Lopressor 25 mg b.i.d. 5. Potassium chloride 20 mEq a day x7 days. 6. Crestor 20 mg at night. 7. Allegra 180 mg daily p.r.n. 8. Allopurinol 300 mg daily. 9. Aspirin enteric coated 325 mg daily. 10.Calcium carbonate/vitamin D daily. 11.Cutivate 0.05% lotion apply daily p.r.n. 12.Cymbalta 30 mg daily. 13.Levothyroxine 100 mcg daily. 14.Lovaza 1 g 2 capsules twice daily with meals. 15.Multivitamin daily. 16.Restasis Ophthalmic 1 drop b.i.d. both eyes. 17.Flomax 0.4 mg at night. 18.Testosterone 1 injection IV monthly. 19.Vitamin D2 50,000 units weekly.     Sol Blazing, PA   ______________________________ Evelene Croon, M.D.    KMD/MEDQ  D:  04/12/2010  T:  04/13/2010  Job:  [id number]  cc:   Cristy Hilts. Jacinto Halim, MD Aida Puffer  Electronically Signed by Cameron Proud PA on 04/25/2010 09:35:03 AM Electronically Signed by Evelene Croon M.D. on 04/26/2010 03:22:37 PM

## 2010-04-30 ENCOUNTER — Other Ambulatory Visit: Payer: Self-pay | Admitting: Surgery

## 2010-04-30 DIAGNOSIS — I2510 Atherosclerotic heart disease of native coronary artery without angina pectoris: Secondary | ICD-10-CM

## 2010-05-01 ENCOUNTER — Encounter (INDEPENDENT_AMBULATORY_CARE_PROVIDER_SITE_OTHER): Payer: Self-pay | Admitting: Surgery

## 2010-05-01 ENCOUNTER — Ambulatory Visit
Admission: RE | Admit: 2010-05-01 | Discharge: 2010-05-01 | Disposition: A | Discharge status: Home / Self Care | Payer: Medicare Other | Source: Ambulatory Visit | Attending: Surgery | Admitting: Surgery

## 2010-05-01 DIAGNOSIS — I2510 Atherosclerotic heart disease of native coronary artery without angina pectoris: Secondary | ICD-10-CM

## 2010-05-02 NOTE — Assessment & Plan Note (Signed)
OFFICE VISIT  [name] [name], [name] [name] DOB:  Jun 03, 1932                                        May 01, 2010 CHART #:  [id number]  The patient returned to my office today for followup, status post coronary artery bypass graft surgery x5 on April 09, 2010.  He has been feeling fairly well and walking daily without chest pain or shortness of breath.  He has been watching his diet and is losing some weight.  His only complaint is of some persistent numbness and weakness in his left hand particularly involving the ulnar aspect and the fourth and fifth fingers.  I suspect this is probably secondary to stretching of his brachial plexus.  PHYSICAL EXAMINATION:  Vital Signs:  His blood pressure is 142/75, pulse 78 and regular, respiratory rate is 16 and unlabored and oxygen saturation on room air is 94%.  General:  He looks well.  Cardiac: Regular rate and rhythm with normal heart sounds.  Lungs:  Clear.  Chest incision is healing well and the sternum is stable.  Extremities:  His leg incision is healing well and there is no peripheral edema.  Followup chest x-ray shows small loculated left pleural effusion.  It has improved compared to his previous x-ray.  His previous right pleural effusion has resolved.  His medications are: 1. Tylenol p.r.n. for pain. 2. Lisinopril 5 mg daily. 3. Lopressor 25 mg b.i.d. 4. Crestor 20 mg nightly. 5. Allegra 180 mg daily. 6. Allopurinol 300 mg daily. 7. Aspirin 325 mg daily. 8. Calcium plus vitamin D daily. 9. Cymbalta 30 mg daily. 10.Levothyroxine 100 mcg daily. 11.Lovaza 2 capsules b.i.d. 12.Multivitamin daily. 13.Restasis ophthalmic solution 1 drop b.i.d. both eyes. 14.Testosterone injections monthly. 15.Vitamin D2 50,000 units weekly.  IMPRESSION:  Overall, the patient is making a good recovery following his surgery.  He does appear to have some weakness and numbness in his left hand related to stretching of his brachial  plexus.  This will usually resolve completely that may take up to 6 months.  I reassured him and advised him to continue using his left hand as he normally would and exercise at using a squeeze ball.  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  He will continue to follow up with Dr. Jacinto Halim and will contact me if develops any problems with his incisions.  Evelene Croon, M.D. Electronically Signed  BB/MEDQ  D:  05/01/2010  T:  05/02/2010  Job:  [id number]  cc:   Cristy Hilts. Jacinto Halim, MD Aida Puffer

## 2010-05-07 ENCOUNTER — Other Ambulatory Visit: Payer: Self-pay | Admitting: Cardiology

## 2010-05-07 ENCOUNTER — Encounter (HOSPITAL_COMMUNITY): Payer: Medicare Other | Attending: Cardiology

## 2010-05-07 ENCOUNTER — Telehealth: Payer: Self-pay

## 2010-05-07 DIAGNOSIS — E039 Hypothyroidism, unspecified: Secondary | ICD-10-CM | POA: Insufficient documentation

## 2010-05-07 DIAGNOSIS — F329 Major depressive disorder, single episode, unspecified: Secondary | ICD-10-CM | POA: Insufficient documentation

## 2010-05-07 DIAGNOSIS — I10 Essential (primary) hypertension: Secondary | ICD-10-CM | POA: Insufficient documentation

## 2010-05-07 DIAGNOSIS — I2510 Atherosclerotic heart disease of native coronary artery without angina pectoris: Secondary | ICD-10-CM | POA: Insufficient documentation

## 2010-05-07 DIAGNOSIS — F3289 Other specified depressive episodes: Secondary | ICD-10-CM | POA: Insufficient documentation

## 2010-05-07 DIAGNOSIS — I708 Atherosclerosis of other arteries: Secondary | ICD-10-CM | POA: Insufficient documentation

## 2010-05-07 DIAGNOSIS — G4733 Obstructive sleep apnea (adult) (pediatric): Secondary | ICD-10-CM | POA: Insufficient documentation

## 2010-05-07 DIAGNOSIS — Z96659 Presence of unspecified artificial knee joint: Secondary | ICD-10-CM | POA: Insufficient documentation

## 2010-05-07 DIAGNOSIS — I200 Unstable angina: Secondary | ICD-10-CM | POA: Insufficient documentation

## 2010-05-07 DIAGNOSIS — M1990 Unspecified osteoarthritis, unspecified site: Secondary | ICD-10-CM | POA: Insufficient documentation

## 2010-05-07 DIAGNOSIS — Z7982 Long term (current) use of aspirin: Secondary | ICD-10-CM | POA: Insufficient documentation

## 2010-05-07 DIAGNOSIS — Z5189 Encounter for other specified aftercare: Secondary | ICD-10-CM | POA: Insufficient documentation

## 2010-05-07 DIAGNOSIS — Z79899 Other long term (current) drug therapy: Secondary | ICD-10-CM | POA: Insufficient documentation

## 2010-05-07 DIAGNOSIS — Z951 Presence of aortocoronary bypass graft: Secondary | ICD-10-CM | POA: Insufficient documentation

## 2010-05-07 DIAGNOSIS — Z8249 Family history of ischemic heart disease and other diseases of the circulatory system: Secondary | ICD-10-CM | POA: Insufficient documentation

## 2010-05-07 NOTE — Telephone Encounter (Signed)
LOv,12 lead faxed to [facility] @ [phone] 05/07/10/KM

## 2010-05-08 LAB — GLUCOSE, CAPILLARY: Glucose-Capillary: 108 mg/dL — ABNORMAL HIGH (ref 70–99)

## 2010-05-09 ENCOUNTER — Encounter (HOSPITAL_COMMUNITY): Payer: Medicare Other

## 2010-05-11 ENCOUNTER — Encounter (HOSPITAL_COMMUNITY): Payer: Medicare Other

## 2010-05-14 ENCOUNTER — Encounter (HOSPITAL_COMMUNITY): Payer: Medicare Other

## 2010-05-16 ENCOUNTER — Encounter (HOSPITAL_COMMUNITY): Payer: Medicare Other

## 2010-05-18 ENCOUNTER — Encounter (HOSPITAL_COMMUNITY): Payer: Medicare Other

## 2010-05-21 ENCOUNTER — Encounter (HOSPITAL_COMMUNITY): Payer: Medicare Other

## 2010-05-23 ENCOUNTER — Encounter (HOSPITAL_COMMUNITY): Payer: Medicare Other

## 2010-05-25 ENCOUNTER — Encounter (HOSPITAL_COMMUNITY): Payer: Medicare Other

## 2010-05-28 ENCOUNTER — Encounter (HOSPITAL_COMMUNITY): Payer: Medicare Other

## 2010-05-30 ENCOUNTER — Encounter (HOSPITAL_COMMUNITY): Payer: BC Managed Care – PPO

## 2010-06-01 ENCOUNTER — Encounter (HOSPITAL_COMMUNITY): Payer: BC Managed Care – PPO

## 2010-06-04 ENCOUNTER — Encounter (HOSPITAL_COMMUNITY): Payer: BC Managed Care – PPO

## 2010-06-06 ENCOUNTER — Other Ambulatory Visit: Payer: Self-pay | Admitting: Oncology

## 2010-06-06 ENCOUNTER — Encounter (HOSPITAL_COMMUNITY): Payer: BC Managed Care – PPO

## 2010-06-06 ENCOUNTER — Ambulatory Visit
Admission: RE | Admit: 2010-06-06 | Discharge: 2010-06-06 | Disposition: A | Discharge status: Home / Self Care | Payer: Medicare Other | Source: Ambulatory Visit | Attending: Oncology | Admitting: Oncology

## 2010-06-06 ENCOUNTER — Encounter (HOSPITAL_BASED_OUTPATIENT_CLINIC_OR_DEPARTMENT_OTHER): Payer: Medicare Other | Admitting: Oncology

## 2010-06-06 DIAGNOSIS — D472 Monoclonal gammopathy: Secondary | ICD-10-CM

## 2010-06-06 LAB — CBC WITH DIFFERENTIAL/PLATELET
BASO%: 0.6 % (ref 0.0–2.0)
Basophils Absolute: 0 10*3/uL (ref 0.0–0.1)
EOS%: 4.4 % (ref 0.0–7.0)
HGB: 12.5 g/dL — ABNORMAL LOW (ref 13.0–17.1)
MCH: 31.2 pg (ref 27.2–33.4)
MCHC: 34 g/dL (ref 32.0–36.0)
RDW: 15 % — ABNORMAL HIGH (ref 11.0–14.6)
lymph#: 1.6 10*3/uL (ref 0.9–3.3)

## 2010-06-06 MED ORDER — GADOBENATE DIMEGLUMINE 529 MG/ML IV SOLN
20.0000 mL | Freq: Once | INTRAVENOUS | Status: AC | PRN
  Administered 2010-06-06: 20 mL via INTRAVENOUS

## 2010-06-08 ENCOUNTER — Encounter (HOSPITAL_COMMUNITY): Payer: BC Managed Care – PPO

## 2010-06-08 LAB — COMPREHENSIVE METABOLIC PANEL
AST: 19 U/L (ref 0–37)
Albumin: 4 g/dL (ref 3.5–5.2)
Alkaline Phosphatase: 68 U/L (ref 39–117)
Calcium: 9.4 mg/dL (ref 8.4–10.5)
Chloride: 103 mEq/L (ref 96–112)
Glucose, Bld: 114 mg/dL — ABNORMAL HIGH (ref 70–99)
Potassium: 4.2 mEq/L (ref 3.5–5.3)
Sodium: 140 mEq/L (ref 135–145)
Total Protein: 6.9 g/dL (ref 6.0–8.3)

## 2010-06-08 LAB — PROTEIN ELECTROPHORESIS, SERUM
Beta 2: 3.8 % (ref 3.2–6.5)
Gamma Globulin: 21.2 % — ABNORMAL HIGH (ref 11.1–18.8)

## 2010-06-08 LAB — KAPPA/LAMBDA LIGHT CHAINS: Kappa free light chain: 1.46 mg/dL (ref 0.33–1.94)

## 2010-06-11 ENCOUNTER — Encounter (HOSPITAL_COMMUNITY): Payer: BC Managed Care – PPO

## 2010-06-12 NOTE — Op Note (Signed)
NAME:  FOUNTAIN, DERUSHA NO.:  [id number]   MEDICAL RECORD NO.:  [id number]          PATIENT TYPE:  INP   LOCATION:  0001                         FACILITY:  [facility]   PHYSICIAN:  Georges Lynch. Gioffre, M.D.DATE OF BIRTH:  Jun 11, 1932   DATE OF PROCEDURE:  10/29/2007  DATE OF DISCHARGE:                               OPERATIVE REPORT   PREOPERATIVE DIAGNOSES:  1. Severe spinal stenosis at L2-3.  2. At L3-4.  3. At L4-5.   POSTOPERATIVE DIAGNOSES:  1. Severe spinal stenosis at L2-3.  2. At L3-4.  3. At L4-5.   OPERATION PERFORMED:  1. Complete decompressive lumbar laminectomy at L2-L3.  2. At L3-L4.  3. At L4-L5.  So, he had a complete decompressive lumbar laminectomy      at three levels for stenosis.   SURGEON:  Dr. Darrelyn Hillock.   ASSISTANT:  Dr. Jene Every.   DESCRIPTION OF PROCEDURE:  Under general anesthesia, a routine  orthopedic prep and drape was carried out of the lower back.  He had 2  gm of IV Ancef.  At this time, two needles were placed in the back for  localization purposes and an x-ray was taken to verify the position.  An  incision then was made over the L2-3, L3-4 and L4-5 spaces.  Bleeders  identified and cauterized.  I then stripped the muscle from the lamina  and spinous processes bilaterally.  Another x-ray was taken with two  Kocher clamps in place.  Following that, a third x-ray was taken as  well.  Once we established the appropriate spaces, we then completed the  excision of the spinous processes of L2, L3-L4.  We brought the  microscope in and then went down and did laminectomies at L2-3, L3-4 and  L4-5.  We gently removed the ligamentum flavum from the dura in order to  prevent any dural leaks.  We did foraminotomies bilaterally at all three  levels.  When the procedure was completed, we then utilized a hockey-  stick to go proximally and distally and we were completely wide open  now.  There was no further constrictions.  We also went  out the foramina  at those levels as well.  Hemostasis was maintained with 10 mL of  FloSeal, then I utilized some Gelfoam in the lateral gutters well away  from the dura.  Following that, the wound then was closed in layers in  the usual fashion.  I did leave a small proximal and small distal deep  part of the wound open for drainage purposes, so there  was no compression on the dural sac.  The subcu was closed with 0  Vicryl, skin with metal staples and a sterile Neosporin dressing was  applied.  The patient went to the recovery room in satisfactory  condition.  Estimated blood loss was about 400 mL.  He will be admitted  to the ICU because of his severe sleep apnea.           ______________________________  Georges Lynch. Darrelyn Hillock, M.D.     RAG/MEDQ  D:  10/29/2007  T:  10/29/2007  Job:  [id number]   cc:   Ollen Gross, M.D.  Fax: [phone]   Aida Puffer, MD  [facility]  Fax #[phone]

## 2010-06-12 NOTE — Assessment & Plan Note (Signed)
[facility]                             PULMONARY OFFICE NOTE   NAME:Arbuthnot, MAJED PELLEGRIN                       MRN:          [id number]  DATE:09/03/2006                            DOB:          September 11, 1932    PROBLEM:  A 77 year old man who comes to establish with me at this  practice.  He had been diagnosed with obstructive sleep apnea and  prescribed a CPAP in 1998.  Old records are not available.  He is  beginning to have problems with his old machine.  Auto titration was  done in June of 2008 to best control between 11 and 14 CWP for an AAHI  around 9 CWP.  His wife says he snores minimally through his mask at  current pressure which they think may be 7 CWP.  Home care company is  [employer].  He notices some nasal congestion.  CPAP is used every night  including trips.  Bedtime around 11 p.m., estimated 15-30 sleep latency,  waking once during the night before finally up at 7 a.m.   MEDICATIONS:  1. Bisoprolol 5 mg.  2. Lovaza 1000 mg.  3. HCTZ 12.5 mg  4. Multivitamins.  5. Calcium.  6. Loratadine 10 mg p.r.n.  7. Aspirin 81 mg.  8. Tylenol p.r.n.   DRUG INTOLERANCES:  CARDIZEM with skin rash.   REVIEW OF SYSTEMS:  Daytime naps, dozing off in the evenings.  Some  dyspnea from sleep.  Weight has been about stable.  No chest pain or  palpitation.   PAST MEDICAL HISTORY:  1. Appendectomy.  2. Hernia repair.  3. Low back surgery.  4. Right elbow.  5. Knee arthroscopy.  6. Right shoulder.  7. Left carotid artery.  8. Hospitalizations for pneumonia.  9. Repeated kidney stones.  10.Fecal impaction.  11.Prior colonoscopies.  12.Hypertension.   SOCIAL HISTORY:  Quit smoking 22 years ago.  He is married.   FAMILY HISTORY:  Daughter with sleep apnea.  Father had heart disease.  Mother had cancer.   OBJECTIVE:  Weight 272 pounds, blood pressure 122/66, pulse 62, room air  saturation 95%.  NECK:  Very thick neck.  He is alert.  HEENT:  Nasal  airways clear, but more narrow on the left.  Palate  spacing 3/4.  No stridor.  CHEST:  Clear.  HEART:  Heart sounds regular without murmur or gallop.  EXTREMITIES:  Without tremor, cyanosis, or clubbing.   IMPRESSION:  Obstructive sleep apnea based on remote diagnostic testing.  Current CPAP at 10 but with retitration suggesting higher pressure  around 11 CWP may work better.  We discussed options, CPAP technology,  sleep hygiene, and management issues.   PLAN:  1. Christoper Allegra is going to increase his CPAP to 11 CWP.  2. Weight loss.  3. Schedule return in one year, earlier p.r.n.     Clinton D. Maple Hudson, MD, Tonny Bollman, FACP  Electronically Signed    CDY/MedQ  DD: 09/03/2006  DT: 09/04/2006  Job #: [id number]   cc:   Aida Puffer

## 2010-06-12 NOTE — H&P (Signed)
NAME:  [name] [name], [name] [name] NO.:  [id number]   MEDICAL RECORD NO.:  [id number]          PATIENT TYPE:  INP   LOCATION:                               [facility]ACILITY:  [facility]   PHYSICIAN:  Georges Lynch. Gioffre, M.D.DATE OF BIRTH:  [birth year]   DATE OF ADMISSION:  10/29/2007  DATE OF DISCHARGE:                              HISTORY & PHYSICAL   PREADMISSION HISTORY AND PHYSICAL:   DATE OF ADMISSION:  October 29, 2007   CHIEF COMPLAINT:  Lower back, bilateral buttocks and thigh pain.   HISTORY OF PRESENT ILLNESS:  The patient is a 77 year old gentleman  patient of Dr. Jeannetta Ellis with a history of lower back, bilateral  buttocks and thigh pain.  The patient had a workup including a CT  myelogram.  She has also had previous surgery in 1984 by Dr. Vear Clock.  The patient's evaluation with CT myelogram shows that he has  multifactorial spinal stenosis at L2-3, L3-4, L4-5.  Dr. Darrelyn Hillock  reviewed the findings with the patient is and physical exam, and the  patient has elected to proceed with a central decompression at L2-3, L3-  4, L4-5 by Dr. Darrelyn Hillock at [facility] on October 1.   CURRENT MEDICATIONS:  1. Bisoprolol 5 mg a day.  2. Hydrochlorothiazide 12.5 mg once a day.  3. Dilaudid 2 mg 1 or 2 tablets every 4-6 hours p.r.n. pain.  4. Allopurinol 300 mg once a day.  The following medications have been put on hold due to the upcoming  surgery: multivitamins, calcium plus D, Allegra, aspirin.   ALLERGIES:  CARDIZEM causing severe rash and blisters.   The patient is an extremely difficult intubation.  He had to have his  last cervical spine surgery cancelled due to inability to intubate him.   Implants:  He has bilateral hearing aids.   MEDICAL HISTORY:  Includes:  1. hypertension.  2. Sleep apnea.  He currently uses a CPAP machine.  3. Difficulty hearing and vision with bilateral hearing aids.  4. History of vertigo.  5. Central obesity.  6. History of cervical chronic  neck pain for which he had to cancel      surgery due to inability to intubate him.  7. History of gout.  8. History of kidney stones.   REVIEW OF SYSTEMS:  Is negative for any neurologic, strokes, seizures,  convulsions.  PULMONARY:  The patient is required to sleep with sleep  apnea machine, otherwise his last bout of pneumonia was in the early  70s.  He denied any shortness of breath or productive type coughs.  CARDIOVASCULAR:  Unremarkable for any chest pains, irregular heart  rhythms, shortness of breath.  He has had a left carotid repair done in  2004 done by Dr. Durwin Nora.  GU:  Positive for kidney stones, the last being  20 years ago.  ENDOCRINE:  Unremarkable.  HEMATOLOGIC:  Unremarkable.   PAST SURGICAL HISTORY:  1. Appendectomy in 1951.  2. Hernia repair in 1963.  3. Lower back surgery by Dr. Vear Clock in 1984.  4. Right elbow surgery in 1992.  5.  Knee arthroscopy in 1998 by Dr. Silvano Rusk.  6. Right shoulder repair in 2003 by Dr. Silvano Rusk.  7. Left carotid artery repair in 2004 by Dr. Durwin Nora.  8. Complete left knee replacement in 2006 by Dr. Silvano Rusk.  The patient's only complication with anesthesia is when he was planned  to have his cervical spine surgery by Dr. Otelia Sergeant,  they were unable to  intubate him and he had to have this procedure cancelled.   PRIMARY CARE PHYSICIAN:  Dr. Aida Puffer.   VASCULAR SURGEON:  Dr, Durwin Nora.   PULMONOLOGIST:  Rennis Chris. Maple Hudson, MD, FCCP, FACP.   OPHTHALMOLOGIST:  Dr. Vella Redhead.   UROLOGIST:  [facility], M.D.   FAMILY MEDICAL HISTORY:  Father is deceased at the age of 6 with a  history of coronary artery disease.  Mother is deceased with colon  cancer at the age of 30.  One brother alive, healthy.  One brother at 33  with dementia.  Two sisters healthy.   SOCIAL HISTORY:  The patient is married.  He is a retired Psychologist, occupational.  He  has smoked in the past, but quit 25 years ago.  He denies any alcohol or  drug use.  Lives with his wife in  a two-story house.  He does have a  living will and power-of-attorney.   PHYSICAL EXAM:  VITALS:  Height is5 feet 7 inches.  Weight is 262, blood  pressure is 148/70, pulse 72 and regular, respirations are 12 and  nonlabored.  The patient appears to be in no extreme distress.  He is  slightly centrally obese.  He does ambulate with a very easy balanced  gait.  HEENT: Head was normocephalic.  Pupils equal, round and reactive.  He  does have bilateral hearing aids in place.  NECK:  He had a very large pendulous chin.  He had no palpable  lymphadenopathy.  He did have limited range of motion of his cervical  spine with some soreness.  CHEST:  Lung sounds were clear and equal bilaterally.  No wheezes,  rales, rhonchi.  HEART:  Regular rate and rhythm.  ABDOMEN:  Round, obese, positive bowel sounds.  EXTREMITIES:  Upper extremities had excellent range of motion of the  shoulders, elbows and wrists.  LOWER EXTREMITIES:  He had full range of motion of both hips, knees and  ankles today.  He had a well-healed left knee anterior incision.  Peripheral vascular carotid pulses were 2+, radial pulses 2+.  He had 1+  posterior tibial pulses.  He had no lower extremity edema, venostasis or  pigmentation changes.  NEURO:  The patient is grossly intact to light touch sensation.  He had  good motor strength of the lower extremities.  He had negative straight  leg raise right and left.  Hyperextension did cause lower back and  buttocks pain.  He was able to bend forward about 70 to 80  degrees with  minimal soreness.   IMPRESSION:  1. Spinal stenosis at L2-3, L3-4, L4-5.  2. Hypertension.  3. Sleep apnea with CPAP machine.  4. Central obesity.  5. Difficult intubation.  6. History of gout.  7. History of kidney stones.  8. Hearing impaired with bilateral hearing aids.   PLAN:  The patient will undergo all routine laboratories and tests prior  to having a central decompression at L2-3, L3-4,  L4-5 by Dr. Darrelyn Hillock at  [facility] on October 29, 2007.  The patient has been evaluated  by primary care physician  and was given medical clearance to proceed  with this surgical procedure.      Jamelle Rushing, P.A.    ______________________________  Georges Lynch Darrelyn Hillock, M.D.    RWK/MEDQ  D:  10/22/2007  T:  10/22/2007  Job:  [id number]   cc:   Windy Fast A. Darrelyn Hillock, M.D.  Fax: [phone]

## 2010-06-13 ENCOUNTER — Encounter (HOSPITAL_BASED_OUTPATIENT_CLINIC_OR_DEPARTMENT_OTHER): Payer: Medicare Other | Admitting: Oncology

## 2010-06-13 ENCOUNTER — Encounter (HOSPITAL_COMMUNITY): Payer: BC Managed Care – PPO

## 2010-06-13 DIAGNOSIS — D472 Monoclonal gammopathy: Secondary | ICD-10-CM

## 2010-06-15 ENCOUNTER — Encounter (HOSPITAL_COMMUNITY): Payer: BC Managed Care – PPO

## 2010-06-15 NOTE — H&P (Signed)
NAME:  [name] [name], [name] [name]                          ACCOUNT NO.:  [id number]   MEDICAL RECORD NO.:  [id number]                   PATIENT TYPE:  AMB   LOCATION:  DAY                                  FACILITY:  [facility]   PHYSICIAN:  Gus Rankin. Aluisio, M.D.              DATE OF BIRTH:  08-10-1932   DATE OF ADMISSION:  09/30/2001  DATE OF DISCHARGE:                                HISTORY & PHYSICAL   CHIEF COMPLAINT:  Right shoulder pain.   HISTORY OF PRESENT ILLNESS:  The patient is a 77 year old male who was seen  in consultation by Dr. Ollen Gross.  He comes in for a check of his right  shoulder.  The patient was found to have right shoulder pain with  significant signs of impingement.  He did undergo subacromial injection and  also was sent for an MRI.  His MRI did prove to be positive for partial  tears of the rotator cuff and some significant amount of subacromial  spurring.  It is felt he would benefit from undergoing surgical intervention  for the right shoulder.  Risks and benefits of the procedure have been  discussed with the patient at length and he has elected to proceed with  surgery.   ALLERGIES:  CARDIZEM caused the patient to break out in blisters and caused  itching.   CURRENT MEDICATIONS:  1. Naproxen 500 mg daily.  2. Norvasc 10 mg daily.  3. Hydrochlorothiazide 10 mg daily.  4. Extra-strength Tylenol p.r.n.  5. Vitamin E 400 international units daily.   PAST MEDICAL HISTORY:  History of pneumonia in 1980s, hypertension, left  carotid arterial disease with stenosis between 40 and 70%, sleep apnea,  history of renal calculi x5, arthritis.   PAST SURGICAL HISTORY:  Hernia repair November 1963, appendectomy in high  school, herniated disk surgery in 1984 per Dr. Vear Clock, elbow surgery per  Dr. Teressa Senter in 1992, arthroscopic knee surgery by Dr. Lequita Halt in October  1998, colon polypectomy in 1995.   SOCIAL HISTORY:  He is married.  A retired Psychologist, occupational.  Quit smoking  approximately 22 years ago.  Very seldom intake of alcohol.  One child.  His  wife, [name], will assist him after surgery.  Has a two-story home with 14  steps.   FAMILY HISTORY:  Father with a history of heart disease and bypass surgery  at age 90.  Father also has hypertension.  Mother with history of colon  cancer.  Both parents are deceased.  Mother was in her 27s.   REVIEW OF SYMPTOMS:  GENERAL:  No fevers, chills, night sweats.  NEUROLOGIC:  No seizures, syncope, paralysis.  RESPIRATORY:  No shortness of breath,  productive cough, or hemoptysis.  He does have sleep apnea and uses a CPAP  machine.  CARDIOVASCULAR:  No chest pain, angina, or orthopnea.  Does have  hypertension and left carotid disease.  GASTROINTESTINAL:  No  nausea,  vomiting, diarrhea, constipation.  No bloody mucus in the stool.  GENITOURINARY:  No dysuria, hematuria, some occasional urgency.  MUSCULOSKELETAL:  The right shoulder found in history of present illness.   PHYSICAL EXAMINATION:  VITAL SIGNS:  Pulse 78, respirations 16, blood  pressure 138/76.  GENERAL:  The patient is a 77 year old white male well-nourished, well-  developed, slightly overweight.  Large frame, barrel chested.  Alert,  oriented, cooperative.  Accompanied by his wife today during the  examination.  HEENT:  Normocephalic, atraumatic.  Pupils round and reactive.  Oropharynx  is clear.  EOMs are intact.  NECK:  Supple.  He has a slight bruit on the left carotid artery.  CHEST:  Clear to auscultation anterior/posterior chest walls.  No rhonchi,  rales, or wheezing.  Distant breath sounds are noted.  HEART:  Regular rate and rhythm.  No murmurs appreciated.  S1, S2 noted.  ABDOMEN:  Large, round, protuberant abdomen.  Bowel sounds are present.  RECTAL:  Not done.  Not pertinent to present illness.  BREASTS:  Not done.  Not pertinent to present illness.  GENITALIA:  Not done.  Not pertinent to present illness.  EXTREMITIES:  Right upper  extremity:  Flexion of the right shoulder is  approximately 140 degrees with abduction of 100 degrees.  He has external  rotation to 30 degrees.  He has markedly positive impingement signs noted on  forward elevation and crossover adduction.  He is also tender over the [facility]  joint in the biceps groove.  He does have good motor strength.   IMPRESSION:  1. Right shoulder pain with impingement, AC arthrosis, and labral tear.  2. Sleep apnea.  3. Hypertension.  4. Left carotid arterial disease (stenosis between 40 and 70%).  5. History of pneumonia.  6. History of renal calculi x5.  7. Arthritis.   PLAN:  The patient will be admitted to [facility] to undergo a  right shoulder arthroscopy with subacromial decompression, distal clavicle  resection, possible biceps tendolysis, and labral debridement.  The  patient's medical physician is Dr. Amador Cunas.  He will be notified of the  room number and admission and will be consulted if needed for any medical  assistance with this patient throughout the hospital course.  Surgery is to  be performed by Dr. Ollen Gross.     Alexzandrew L. Julien Girt, P.A.              Gus Rankin Aluisio, M.D.    ALP/MEDQ  D:  09/23/2001  T:  09/24/2001  Job:  [id number]   cc:   Gordy Savers, M.D. [facility]

## 2010-06-15 NOTE — H&P (Signed)
NAME:  [name] [name], [name] [name] NO.:  [id number]   MEDICAL RECORD NO.:  [id number]          PATIENT TYPE:  INP   LOCATION:  1506                         FACILITY:  [facility]   PHYSICIAN:  Ollen Gross, M.D.    DATE OF BIRTH:  1932-12-18   DATE OF ADMISSION:  07/03/2005  DATE OF DISCHARGE:                                HISTORY & PHYSICAL   DATE OF OFFICE VISIT/HISTORY & PHYSICAL:  Jun 07, 2005.   CHIEF COMPLAINT:  Left knee pain.   HISTORY OF PRESENT ILLNESS:  The patient is a 77 year old male who has been  seen by Dr. Lequita Halt for ongoing left knee pain. He has been seen for several  years now for known arthritis. He has been getting around pretty well with  the exception of his left knee. The left knee unfortunately  has gotten  progressively worse over time. It definitely started to interfere with what  he can and cannot do. He has had injections in the past which have only  provided temporary relief. X-rays show bone-on-bone medial and also in  patellofemoral compartments with end-stage arthritis. It is felt he has  reached a point where he could benefit from undergoing surgery.  The risks  and benefits have been discussed and the patient has elected to proceed with  surgery.   ALLERGIES:  CARDIZEM.   CURRENT MEDICATIONS:  Vytorin, sotalol/hydrochlorothiazide, Diovan, Allegra,  Tylenol. We stopped his aspirin.   PAST MEDICAL HISTORY:  1.  Past history of pneumonia.  2.  Sleep apnea, which he uses a CPAP.  3.  Hypertension.  4.  Coronary arterial disease.  5.  History of cardiomegaly.  6.  Carotid arterial disease.  7.  Renal calculi.   PAST SURGICAL HISTORY:  1.  Attempted disk surgery, but aborted secondary to difficult intubation.  2.  Appendectomy.  3.  Right hernia repair.  4.  Back surgery.  5.  Elbow surgery.  6.  Arthroscopic knee surgery.  7.  Right shoulder surgery.  8.  Left carotid arterial surgery.   SOCIAL HISTORY:  The patient is married,  retired, nonsmoker, no alcohol, one  child. Wife will be assisting with care after surgery.   FAMILY HISTORY:  Father with history of heart disease, deceased, at age 13  with bypass when he was age 15.  Mother is deceased, age 54, with history of  colon cancer.   REVIEW OF SYSTEMS:  GENERAL: No fever, chills, nightsweats. NEUROLOGIC: No  seizure, syncope, or paralysis. RESPIRATORY:  History of sleep apnea. Uses  CPAP. No shortness of breath, productive cough, or hemoptysis.  CARDIOVASCULAR: No chest pain, angina, or orthopnea. GI: No nausea,  vomiting, diarrhea, or constipation. GU: No dysuria, hematuria, or  discharge. MUSCULOSKELETAL: Left knee.   PHYSICAL EXAMINATION:  VITAL SIGNS: Pulse 64, respirations 12, blood  pressure 132/62.  GENERAL: A 77 year old white male, well-nourished, well-developed,  overweight, short frame, in no acute distress. He is alert, oriented, and  cooperative, very pleasant at the time of my exam.  HEENT:  Normocephalic and atraumatic. Pupils are round and reactive.  Oropharynx  is clear. EOMs are intact.  NECK: Short cervical neck. The neck is supple.  CHEST: Clear to auscultation in the anterior and posterior chest wall. No  rales, rhonchi, or wheezes.  HEART: Regular rate and rhythm.  No murmurs. S1 and S2 noted.  ABDOMEN: Soft, round, protuberant abdomen. Bowel sounds are present.  RECTAL/BREASTS/GENITALIA: Not done; not pertinent to the present illness.  EXTREMITIES: Left knee range of motion 10 to 110 degrees, marked crepitus  noted, varus deformity, malalignment, tender more medial than lateral.   IMPRESSION:  1.  Osteoarthritis, left knee.  2.  History of difficult tracheal intubation.  3.  Sleep apnea, uses CPAP.  4.  History of pneumonia.  5.  Hypertension.  6.  Coronary artery disease.  7.  History of cardiomegaly.  8.  History of carotid arterial disease.  9.  History of renal calculi.  10. History of cervical herniated disk.   PLAN:   The patient is admitted to [facility] to undergo a left  total knee arthroplasty. Surgery will be performed by Dr. Ollen Gross.      Alexzandrew L. Julien Girt, P.A.      Ollen Gross, M.D.  Electronically Signed    ALP/MEDQ  D:  07/03/2005  T:  07/03/2005  Job:  [id number]   cc:   Aida Puffer, M.D.  [facility]   Ollen Gross, M.D.  Fax: [phone]

## 2010-06-15 NOTE — Discharge Summary (Signed)
NAME:  [name] [name] NO.:  [id number]   MEDICAL RECORD NO.:  [id number]          PATIENT TYPE:  INP   LOCATION:  1620                         FACILITY:  [facility]   PHYSICIAN:  [name] [name]. Gioffre, M.D.DATE OF BIRTH:  25-Jan-1933   DATE OF ADMISSION:  10/29/2007  DATE OF DISCHARGE:  10/31/2007                               DISCHARGE SUMMARY   ADMISSION DIAGNOSES:  1. Spinal stenosis at L2-3, L3-4, L4-5.  2. Hypertension.  3. Sleep apnea with CPAP machine.  4. Central obesity.  5. Difficulty with previous intubation.  6. History of gout.  7. History kidney stones.  8. Hearing impaired with bilateral hearing aids.   DISCHARGE DIAGNOSES:  1. Status post decompressive lumbar laminectomy at L2-3, L3-4, L4-5      for spinal stenosis.  2. History of hypertension.  3. Sleep apnea with CPAP machine.  4. Central obesity.  5. History of previous difficulty intubation.  6. History of gout.  7. History of kidney stones.  8. Hearing impaired with bilateral hearing aids.   HISTORY OF PRESENT ILLNESS:  The patient is a 77 year old gentleman,  patient of Dr. Jeannetta [name] with a history of lower back, bilateral  buttocks and thigh pain.  The patient worked up with a CT myelogram, had  previous surgery in 1984 by Dr. Vear [name].  A CT myelogram shows he has  multifactorial spinal stenosis at L2-3, L3-4, L4-5.  The patient and Dr.  Darrelyn [name] have discussed the issues.  The patient has elected to proceed  with central decompressive lumbar laminectomy at L2-3, L3-4 and L4-5.   MEDICATIONS ON ADMISSION:  1. Bisoprolol5 mg a day.  2. Hydrochlorothiazide 12.5 mg a day.  3. Dilaudid 1 or 2 tablets [name] 4-6 hours for pain.  4. Allopurinol 300 mg a day.   ALLERGIES:  CARDIZEM.   SURGICAL PROCEDURES:  On October 29, 2007, the patient was taken to the  OR by Dr. Worthy [name] assisted by Dr. Jene [name] under general  anesthesia.  The patient underwent a complete decompressive lumbar  laminectomy at L2-L3 and L3-L4 and L4-L5 for her spinal stenosis.  There  were no complications.  Estimated blood loss was 400 mL.  The patient  was transferred to the recovery room and then to the orthopedic floor in  good condition.   CONSULTANTS:  Following routine consults requested:  Physical therapy,  case management, pharmacy.   HOSPITAL COURSE:  On November 07, 2007, the patient was admitted to  [facility] under the care of Dr. Worthy [name].  The patient was  taken to the OR where the patient had a complete decompressive lumbar  laminectomy at L2-3, L3-4, L4-5 for spinal stenosis.  The patient  tolerated the procedure well, was transferred to recovery room and then  to orthopedic floor in good condition on IV pain medicines and  antibiotics.  The patient then spent 2 days postoperative care on the  orthopedic floor in which the patient was able to get off IV medicines  well.  The patient's wound remained benign for any signs of infection.  His vital  signs remained stable.  He remained afebrile.  The patient  remained neurologically intact.  He had decreased discomfort.  The  patient was able to participate with physical therapy and worked well.  He was comfortable and medically stable upon date of discharge and he  was discharged to home in good condition with routine followup.  The  patient ambulated approximately 400 feet on his last hospital day.   LABS:  CBC on admission found WBCs 5.7, hemoglobin 15.9, hematocrit 46,  platelets 136.  His H and H on date of discharge was 13.7 and 39.7.  Routine chemistries on admission were within normal limits except for  glucose 131.  Urinalysis on admission was normal.  Chest x-ray on  admission found no acute cardiopulmonary abnormalities.   DISCHARGE INSTRUCTIONS:  1. Diet:  The patient is to resume his regular diet without any      restrictions.  2. Activity:  The patient is to ambulate with the use of a walker and       follow physical therapy instructions.  3. Wound care:  The patient is to change his dressing daily.  4. The patient needs a followup appoint with Dr. Darrelyn [name] 2 weeks from      date of surgery.  The patient is to call [phone] for that followup      appointment.  5. Home health physical therapy through [facility].   MEDICATIONS:  1. Percocet 10/650 one tablet [name] 4-6 hours for pain if needed.  2. Robaxin 500 mg 1 tablet [name] 6 hours for muscle spasms if needed.  3. Hydrochlorothiazide 12.5 mg once a day.  4. Zebeta 5 mg once a day.  5. Lovaza 1 g 3 times a day.  6. Aspirin 81 mg a day.  7. Fexofenadine 880 mg once a day.  8. Calcium 600 plus D 2 tablets once a day.  9. Multivitamins once a day.  10.Allopurinol 300 mg once a day.  11.Dilaudid 2 mg 1 or 2 tablets as needed.  12.Vicodin 5 mg 1 tablet as needed.  The patient's medication reconciliation form was filled out by Dr.  Darrelyn [name] with multiple narcotic without any changes.   The patient's condition upon discharge to home is improved.      [name] [name], P.A.    ______________________________  [name] [name] [name] [name], M.D.    RWK/MEDQ  D:  11/18/2007  T:  11/18/2007  Job:  [id number]   cc:   [name] [name], M.D.  Fax: [phone]

## 2010-06-15 NOTE — Discharge Summary (Signed)
NAME:  [name] [name], [name] [name] NO.:  [id number]   MEDICAL RECORD NO.:  [id number]          PATIENT TYPE:  INP   LOCATION:  1506                         FACILITY:  [facility]   PHYSICIAN:  Ollen Gross, M.D.    DATE OF BIRTH:  1932-08-24   DATE OF ADMISSION:  07/03/2005  DATE OF DISCHARGE:  07/08/2005                                 DISCHARGE SUMMARY   ADMISSION DIAGNOSES:  1. Osteoarthritis, left knee.  2. Previous history of difficult tracheal intubation.  3. Sleep apnea, uses continuous positive airway pressure.  4. History of pneumonia.  5. Hypertension.  6. Coronary arterial disease.  7. History of cardiomegaly.  8. History of carotid arterial disease.  9. History of renal calculi.  10.History of cervical herniated disk.   DISCHARGE DIAGNOSES:  1. Osteoarthritis left knee status post left total knee arthroplasty.  2. Mild blood loss anemia.  3. Postoperative hyponatremia, improved.  4. Previous history of difficult tracheal intubation.  5. Sleep apnea, uses continuous positive airway pressure.  6. History of pneumonia.  7. Hypertension.  8. Coronary arterial disease.  9. History of cardiomegaly.  10.History of carotid arterial disease.  11.History of renal calculi.  12.History of cervical herniated disk.   PROCEDURES:  On July 03, 2005, left total knee surgery by Dr. Lequita Halt and  assisted by Alexzandrew L. Perkins, P.A.-C. under spinal anesthesia.   CONSULTATIONS:  Rehab Services.   BRIEF HISTORY:  [name] [name] is a 77 year old male with end stage arthritis  of the left knee, bone-on-bone, failing operative management, now presents  for total knee.   LABORATORY DATA:  Preop CBC:  Hemoglobin 15.1, hematocrit 44.2, preop chem  panel all within normal limits with the exception of a slightly elevated  glucose of 119.  Serum CBC's were followed.  Hemoglobin drifted into 13.3  and last noted at 12.9 and 37.2.  PT/PTT preop 13.0 and 29, respectively  with INR of  1.0.  Serum pro times as follows:  PT/INR 24.4/2.2.  Chemistry  panel on admission, within normal limits again with the exception of  elevated glucose of 119.  Sodium did drop down to 132, came back up to 136,  back up to 137.  Preop UA negative.  Blood group type A positive.  EKG on  the chart dated May 21, 2005, sinus rhythm within normal limits, confirmed  by Dr. Clarene Duke, [facility].   HOSPITAL COURSE:  The patient was sent to the hospital, taken to the OR,  underwent above said procedure without complications.  Tolerated procedure  well.  Was transferred to the recovery room and then orthopedic floor,  started on Coumadin and analgesics.  The patient did have some fair amount  of pain on the night of surgery and on the morning of day #1, Hemovac drain  was pulled.  On day one, there was some question of whether we would go home  versus rehab.  Therefore, rehab consult was ordered postoperatively.  He was  seen by [facility] and felt that given his complicating medical issues,  he would possibly need inpatient rehab,  but they would follow along with the  progress.  By day #2, he was doing a little bit better.  Pain was a little  bit better controlled, and some low-grade temperature encouraged,  instinctive spirometer.  Hemoglobin was 13.3.  I did have a little bit of  decrease in his sodium down to 134.  Fluids were stopped and rechecked.  Got  up out of bed on day #1 and by day #2, he was getting around about 35 feet  with some minimal guarded assistance.  Dressings changed on day #2.  By day  #3, he was doing a little bit better and actually progressed well with his  therapy, during supervision, walking about 100 feet.  It was felt at that  time that he was doing so well that he would not require any type of  inpatient or skilled nursing.  Continued to receive therapy daily.  Worked  on his pain control and weaned from pain meds.  Continued to progress well.  By July 08, 2005, the patient was doing quite well, tolerating his  medications, and was discharged home.   DISCHARGE PLAN:  1. The patient was discharged home on July 08, 2005.  2. For discharge diagnoses, please see above.  3. Discharge medications:  Coumadin, Robaxin, Tylox.  4. Diet:  Resume previous home diet.  5. Activity:  Weightbearing as tolerated.  Total knee protocol.  6. Home Health PT, Home Health nursing.   FOLLOWUP:  Follow up in two weeks.   DISPOSITION:  Home.   CONDITION ON DISCHARGE:  Improved.      Alexzandrew L. Julien Girt, P.A.      Ollen Gross, M.D.  Electronically Signed   ALP/MEDQ  D:  08/28/2005  T:  08/29/2005  Job:  [id number]   cc:   Ollen Gross, M.D.  Fax: [phone]   Aida Puffer  Fax: [phone]

## 2010-06-15 NOTE — Op Note (Signed)
NAME:  [name] [name], [name] [name] NO.:  [id number]   MEDICAL RECORD NO.:  [id number]                   PATIENT TYPE:  INP   LOCATION:                                       FACILITY:  [facility]   PHYSICIAN:  Di Kindle. Edilia Bo, M.D.        DATE OF BIRTH:  26-Feb-1932   DATE OF PROCEDURE:  08/12/2002  DATE OF DISCHARGE:  08/13/2002                                 OPERATIVE REPORT   PREOPERATIVE DIAGNOSES:  Asymptomatic tight left carotid stenosis.   POSTOPERATIVE DIAGNOSES:  Asymptomatic tight left carotid stenosis.   OPERATION PERFORMED:  Left carotid endarterectomy with resection of  redundant common carotid artery and Dacron patch angioplasty.   SURGEON:  Di Kindle. Edilia Bo, M.D.   ASSISTANT:  Eber Hong, P.A.   ANESTHESIA:  General.   INDICATIONS FOR PROCEDURE:  The patient is a 77 year old gentleman who I  have been following with a moderate left carotid stenosis.  This progressed  to greater than 80%. He was asymptomatic.  However, given the severity of  the stenosis, left carotid endarterectomy was recommended in order to lower  his risk of future stroke.  The procedure and potential complications  including but not limited to bleeding, wound problems, stroke (perioperative  risk 1 to 2%), MI, nerve injury, and other unpredictable medical problems  were discussed with the patient preoperatively. All of his questions were  answered and he was agreeable to proceed.   DESCRIPTION OF PROCEDURE:  The patient was taken to the operating room after  an arterial line was placed by anesthesia.  He received a general  anesthetic.  The left neck and upper chest were prepped and draped in the  usual sterile fashion.  Of note, the patient was morbidly obese and had a  very thick neck.  An incision was made from the manubrium up posterior to  the ear and the dissection was carried down to the platysma.  Dissection was  carried down to the common  carotid artery which was quite deep but was  dissected free and controlled with Rumel tourniquet.  The facial vein was  divided between 2-0 silk ties. The dissection was continued proximally.  The  hypoglossal nerve was identified.  Of note the bifurcation was quite high.  In addition, the internal carotid artery came off at very acute angle and  then the internal carotid artery above the bifurcation was quite tortuous  and redundant.  I had to dissect very high above the digastric to get distal  control.  The hypoglossal nerve was kept carefully preserved.  I used a  Rumel tourniquet distally also.  The external carotid artery was controlled  with a blue vessel loop and I divided the superior thyroid artery.  The  patient was then heparinized.  Clamps were then placed on the internal, then  the common, then the external carotid artery.  A longitudinal arteriotomy  was made and dissection  carried up above the plaque into the internal  carotid artery.  I placed a long 12 shunt; however, it was very difficult to  visualize distally and as I knew that I was going to have to resect  redundant artery in order to prevent kinking of the artery.  I decided not  to use the shunt.  The patient had excellent backbleeding and I felt  comfortable with this.  Next, the endarterectomy plane was established  proximally and the plaque was sharply divided.  Eversion endarterectomy was  performed at the external carotid artery.  Distally, the plaque tapered  nicely.  There was a lot of redundant vessel and in order to resect the  redundant internal carotid artery as the bifurcation was very high and the  exposure was very difficult here, I thought this would be technically almost  impossible.  For that reason, I elected to resect the common carotid artery  below the external carotid artery and therefore take up some of this  redundancy. Tacking sutures were placed and then approximately 3 cm of the  common  carotid artery was resected and then the back wall sewn back end-to-  end with continuous 6-0 Prolene suture.  There was a nice lie at this point.  The vessel was irrigated with copious amounts of heparin and dextran and all  loose debris removed.  The Dacron patch was then sewn using continuous 6-0  Prolene suture.  Prior to completing the patch closure, the vessels were  back bled and flushed appropriately and anastomosis completed.  Flow was re-  established first to the external carotid artery and then to the internal  carotid artery.  At completion there was a good Doppler signal distal to the  patch and a good pulse.  Hemostasis was obtained in the wound.  I  elected  to place a drain as he had a very deep dissection.  The deep layer was  closed with running 2-0 Vicryl. The platysma was closed with running 2-0  Vicryl.  The subcutaneous tissue was then closed with 3-0 Vicryl and the  skin was then closed with 4-0 Vicryl.  Sterile dressing was applied.  The  patient awoke neurologically intact.  All sponge and needle counts were  correct.  A sterile dressing was applied, the patient tolerated the procedure well and  was transferred to the recovery room in satisfactory condition.  All needle  and sponge counts were correct.                                                  Di Kindle. Edilia Bo, M.D.    CSD/MEDQ  D:  08/12/2002  T:  08/12/2002  Job:  [id number]

## 2010-06-15 NOTE — Op Note (Signed)
NAME:  [name] [name], [name] [name] NO.:  [id number]   MEDICAL RECORD NO.:  [id number]          PATIENT TYPE:  INP   LOCATION:  0007                         FACILITY:  [facility]   PHYSICIAN:  Ollen Gross, M.D.    DATE OF BIRTH:  Jun 04, 1932   DATE OF PROCEDURE:  07/03/2005  DATE OF DISCHARGE:                                 OPERATIVE REPORT   PREOPERATIVE DIAGNOSIS:  Osteoarthritis, left knee.   POSTOPERATIVE DIAGNOSIS:  Osteoarthritis, left knee.   PROCEDURE:  Left total knee arthroplasty.   SURGEON:  Ollen Gross, M.D.   ASSISTANT:  Alexzandrew L. Julien Girt, P.A.   ANESTHESIA:  Spinal.   ESTIMATED BLOOD LOSS:  Minimal.   DRAINS:  Hemovac x 1.   TOURNIQUET TIME:  53 minutes at 300 mmHg.   COMPLICATIONS:  None.   CONDITION:  Stable to recovery.   BRIEF CLINICAL NOTE:  [name] [name] is a 77 year old male with severe end-  stage osteoarthritis left knee.  He is bone on bone.  Failed nonoperative  management and presents now for total knee arthroplasty.   PROCEDURE IN DETAIL:  After successful administration of spinal anesthetic,  tourniquet placed high on the left thigh and left lower extremity is prepped  and draped in usual sterile fashion.  Extremities wrapped in Esmarch, knee  flexed, tourniquet inflated 300 mmHg.  Standard midline incision made with  10 blade through subcutaneous tissue to the level of the extensor mechanism.  Fresh blade is used to make a medial parapatellar arthrotomy and soft tissue  of the proximal medial tibia subperiosteally elevated to the joint line with  the knife into the semimembranosus bursa with a Cobb elevator.  Soft tissue  of the proximal lateral tibia is also elevated with attention being paid to  avoid the patellar tendon on tibial tubercle.  Patella was everted, knee  flexed 90 degrees, ACL and PCL removed.  Drill was used to create a starting  hole on the distal femur.  Canal was thoroughly irrigated.  5 degrees left  valgus  alignment guide was placed referencing off the posterior condyles,  rotations marked the block pinned to remove 11 mm of the distal femur.  He  had slight flexion contractures so we took a little bit extra distal femur.  The size 5 is the most appropriate femoral component and the rotation is  marked off the epicondylar axis.  Size 5 cutting block is placed and the  anterior, posterior and chamfer cuts were made.   Tibia subluxed forward and the menisci removed.  Extramedullary tibial  alignment guide is placed referencing proximally at the medial aspect of  tibial tubercle and distally along the second metatarsal axis and tibial  crest.  Block is pinned to remove 10 mm off the non deficient lateral side.  Tibial resection is made with an oscillating saw.  Size 4 is most  appropriate tibial component and a proximal tibia prepared with modular  drill and keel punch for a size 4.  Femoral preparation is completed the  intercondylar cut.   Size 4 mobile bearing tibial trial with a  size 5.0 posterior stabilized  femoral trial and a 10 mm posterior stabilized rotating platform insert  trial placed.  With a 10 full extension is achieved with excellent varus and  valgus balance throughout full range of motion.  The patella was everted,  thickness measured 25 mm.  Freehand resection is taken to 13 mm, 41 template  is placed, lug holes were drilled, trial patella was placed and it tracks  normally.  Osteophytes were then removed from the posterior femur with the  trial placed.  All trials removed and the cut bone surfaces are prepared  with pulsatile lavage.  Cement mixed and once ready for implantation, the  size 4 mobile bearing tibial tray, size 5 posterior stabilized femur and 41  patella are cemented into place.  The patella was held the clamp.  Trial 10  mm inserts placed, knee held in full extension, all extruded cement removed.  Once cement fully hardened, the permanent 10 mm posterior  stabilized  rotating platform insert is placed into the tibial tray.  Wound was  copiously irrigated with saline solution and the extensor mechanism closed  over Hemovac drain.  Flexion against gravity to 135 degrees.  Tourniquet was  released with a total time of 53 minutes.  Subcu closed interrupted 2-0  Vicryl subcuticular running 4-0 Monocryl.  Incisions cleaned and dried and  Steri-Strips applied.  Drains hooked to suction and a bulky sterile dressing  is applied.  He was then placed a knee immobilizer, awakened, transferred to  recovery in stable condition.      Ollen Gross, M.D.  Electronically Signed     FA/MEDQ  D:  07/03/2005  T:  07/04/2005  Job:  [id number]

## 2010-06-15 NOTE — Op Note (Signed)
TNAMEALAND, CHESTNUTT                         ACCOUNT NO.:  [id number]   MEDICAL RECORD NO.:  [id number]                   PATIENT TYPE:  OBV   LOCATION:  0448                                 FACILITY:  [facility]   PHYSICIAN:  Gus Rankin. Aluisio, M.D.              DATE OF BIRTH:  1932/11/20   DATE OF PROCEDURE:  09/30/2001  DATE OF DISCHARGE:                                 OPERATIVE REPORT   PREOPERATIVE DIAGNOSIS:  Right shoulder impingement.  Anterior cruciate  arthroses.   POSTOPERATIVE DIAGNOSIS:  Right shoulder impingement.  Anterior cruciate  arthroses.  Rotator cuff tear.   PROCEDURE:  Right shoulder arthroscopy with labral debridement and open  subacromial decompression.  Distal clavicle resection and rotator cuff  repair.   SURGEON:  Gus Rankin. Aluisio, M.D.   ASSISTANT:  Alexzandrew L. Julien Girt, P.A.   ANESTHESIA:  General.   ESTIMATED BLOOD LOSS:  Minimal.   DRAINS:  None.   COMPLICATIONS:  None.   DISPOSITION:  Stable to recovery.   BRIEF CLINICAL NOTE:  The patient is a 77 year old male who had severe right  shoulder pain, refractory to nonoperative management including exercise and  injection.  MRI showed partial thickness tears and significant impingement  morphology.  He presents now for the above-mentioned procedures, secondary  to failure of nonoperative management.   PROCEDURE IN DETAIL:  After the successful administration of general  anesthetic, the patient is placed sitting upright in the beachchair  position.  The right upper extremity and shoulder girdle are isolated from  his trunk with plastic drapes; prepped and draped in a usual sterile  fashion.  Standard arthroscopic landmarks are marked.  His body habitus was  such that it was very difficult to palpate the standard landmarks, but we  were able to do so.  A small incision was made posterior and then camera and  cannula passed through the joint.  Once __________ initiated.  Glenoid  humeral  surfaces looked fine.  Did have complete tear of his biceps, which  already had retracted out of the joint.  I was unable to locate the  intraarticular biceps.  There was a stump present off the superior glenoid.  We localized anterior portal and made a small incision, and placed the  cannula.  The ArthroCare was then used to debride the stump back to the  labrum.  He had a lot of fraying of the superior labrum, but no gross  detachment.  This was debrided back to stable base with the ArthroCare.  The  intraarticular sub scapula looks normal.  The undersurface of the  supraspinatus does show a high-grade partial tear at the insertion on the  greater tuberosity.  The rest of the cuff looks fine.  At this time the  glenohumeral joint is excavated and the subacromial space entered.  There  was a tremendous amount of difficulty with visualization, and given this we  decided to convert to an open procedure since we new he was going to need  the rotator cuff repair anyway.  Incision is made along the skin lines from  posterior to anterior at the mid acromial level.  Skin was cut a #10 blade,  through a very thick layer of subcutaneous tissue to the level of deltoid  fascia, and then subcutaneous flaps are elevated.  The clavicular fascia was  split in mid substance longitudinally.   Then carried across the anterior acromion and a small split is made in the  deltoid of about 2 cm.  The entire anterior soft tissue sleeve is  subperiosteally elevated.  Posteriorly just the tissue over the distal  clavicle is elevated.  The distal 1 cm of the clavicle was then removed, and  the spur excised with this fragment.  He had a large hook through his  acromion, and Cobb elevator was used to protect the underlying structures.  Then the acromioplasty performed with an oscillating saw, back to create a  flat acromial undersurface.  Very thickened layer of hypertrophic bursa is  then removed off the rotator  cuff.  There was no obvious gross tear, but  there was small split in the supraspinatus longitudinally.  I then palpated  the insertion and there is definitely a gap present just adjacent to the  biceps groove; it was about 1 cm in size, where the supraspinatus should  attach.  At that point for the bursa and did notice that there was a tear  present.  I then created a trough, placed a Mitek anchor to the trough and  advanced the free edge of the tendon into the trough by utilizing the  sutures.  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  The small split is also repaired  with the Ethibond.  The wound is then copiously irrigated and deltoid  reattached to acromion through drill holes with #1 Ethibond.  The fascia  over the distal clavicle was embrocated and then the deltoid split; also  closed with the Ethibond.  The subcutaneous was closed in two layers and  interrupted 2-0 Vicryl.  Subcuticular in running 4-0 Monocryl.  The prior  arthroscopic ports are closed with interrupted 4-0 nylon.  A bulky sterile  dressing is applied.  He was placed into a shoulder immobilizer, awakened  and transferred to the recovery room in stable condition.                                               Gus Rankin Aluisio, M.D.    FVA/MEDQ  D:  09/30/2001  T:  09/30/2001  Job:  [id number]

## 2010-06-15 NOTE — Op Note (Signed)
NAME:  [name] [name], [name] [name]                          ACCOUNT NO.:  [id number]   MEDICAL RECORD NO.:  [id number]                   PATIENT TYPE:  OIB   LOCATION:  NA                                   FACILITY:  [facility]   PHYSICIAN:  Kerrin Champagne, M.D.                DATE OF BIRTH:  May 29, 1932   DATE OF PROCEDURE:  07/19/2003  DATE OF DISCHARGE:                                 OPERATIVE REPORT   PREOPERATIVE DIAGNOSIS:  Herniated nucleus pulposus, central C6-7, left C7  radiculopathy.   POSTOPERATIVE DIAGNOSIS:  Herniated nucleus pulposus, central C6-7, left C7  radiculopathy.   OPERATION PERFORMED:  The patient underwent attempt at intubation for  purposes of undergoing cervical spine surgery which was unsuccessful. He was  therefore awakened from anesthesia and returned to the recovery room and  discharged home uneventfully.   SURGEON:  Kerrin Champagne, M.D.   ANESTHESIA:  Sheldon Silvan, M.D.   ESTIMATED BLOOD LOSS:  0 mL.   DRAINS:  None.   INDICATIONS FOR PROCEDURE:  The patient is a 77 year old male with central  disk protrusion at C6-7, left-sided C7 weakness in the triceps, finger  extension as well as numbness in the C7 distribution.  He has undergone  attempts at conservative management including the use of steroid medicines,  anti-inflammatory agents without relief of pain.  He was found to have  problems with cord compression at the C6-7 level secondary to a combination  of spur and disk material protruding within the spinal canal.  He has  weakness in the left C7 distribution. He was brought to the operating room  to undergo an anterior diskectomy and fusion at the C6-7 level.   DESCRIPTION OF PROCEDURE:  This patient while undergoing induction of  general anesthesia was unable to be intubated either orotracheally or by the  use of endoscopic techniques.  With multiple attempts, soft tissues showed  swelling and the attempted anesthesia was then aborted.  The patient will  undergo attempts at further conservative measures and preoperative  evaluation for consideration of awake intubation, should the procedure be  rescheduled.                                               Kerrin Champagne, M.D.    Myra Rude  D:  07/19/2003  T:  07/20/2003  Job:  [id number]

## 2010-06-15 NOTE — H&P (Signed)
NAME:  [name] [name], [name] [name] NO.:  [id number]   MEDICAL RECORD NO.:  [id number]                   PATIENT TYPE:  INP   LOCATION:                                       FACILITY:  [facility]   PHYSICIAN:  Di Kindle. Edilia Bo, M.D.        DATE OF BIRTH:  Sep 10, 1932   DATE OF ADMISSION:  08/12/2002  DATE OF DISCHARGE:                                HISTORY & PHYSICAL   REASON FOR ADMISSION:  Left carotid stenosis.   HISTORY:  This is a pleasant 77 year old gentleman whom I had originally  seen in consultation with a moderate left carotid stenosis in June 2003.  He  was seen for a followup study in January, and at that time, the stenosis was  in the 60 to 79% range; he was asymptomatic, and a six-month followup visit  was arranged.  He came in on July 28, 2002, for a six-month followup visit;  at that time, he had been asymptomatic with no history of stroke, TIAs,  expressive or receptive aphasia, or amaurosis fugax.  Followup carotid  duplex scan on July 28, 2002, showed that the left carotid stenosis had now  progressed to clearly greater than 80%.  Peak systolic velocity was 421  cm/second, and end-diastolic velocity was 190 cm/second.  He had no  significant carotid stenosis on the right side.  Both vertebral arteries  were patent with normally directed flow.   PAST MEDICAL HISTORY:  1. Significant for hypertension.  2. In addition, he may have sleep apnea, but he is not sure.  3. He denies any history of diabetes, hypercholesterolemia, history of     previous myocardial infarction, or history of congestive heart failure.  4. Of note, he also does have obesity.   FAMILY HISTORY:  Significant for a mother who had colon cancer and a father  who had coronary artery disease.  He is unaware of any history of premature  cardiovascular disease, however.   SOCIAL HISTORY:  He is married.  He is retired.  He quit tobacco 18 years  ago.   ALLERGIES:  CARDIZEM,  which causes hives and blisters.   MEDICATIONS:  1. Norvasc 10 mg p.o. daily.  2. Hydrochlorothiazide 25 mg p.o. daily.  3. Naproxen 500 mg p.o. daily.  4. Vitamin E 400 IU one p.o. daily.  5. Enteric-coated aspirin 81 mg p.o. daily.   REVIEW OF SYSTEMS:  He has had no recent weight loss or weight gain; he  weighs 250 pounds.  He has had no recent chest pain or chest pressure.  He  does admit to dyspnea on exertion.  He has had no history of bronchitis,  asthma, or wheezing.  He has had no recent change in his bowel habits and  has no history of peptic ulcer disease.  He has had no dysuria or frequency.  He denies any history of claudication, rest pain, or nonhealing ulcers.  He  has had no history of DVT or phlebitis.  He has had no problems with  dizziness, blackouts, headaches, or seizures.  He has had no bleeding  problems or clotting disorders that he is aware of.  He does admit to  arthritis and joint pain.   PHYSICAL EXAMINATION:  VITAL SIGNS:  Blood pressure is 160/60 on the right  and 110/64 on the left, heart rate is 88.  NECK:  I do not detect any carotid bruits.  He does have a fairly thick  neck; he is moderately obese.  LUNGS:  Clear bilaterally to auscultation.  CARDIAC EXAM:  He has a regular rate and rhythm.  ABDOMEN:  Soft and nontender.  He has palpable femoral pulses.  EXTREMITIES:  Both feet are warm and well perfused without ischemic  ulcerations.  NEUROLOGIC EXAM:  Nonfocal.  He has no significant lower extremity swelling.   IMPRESSION:  This patient presents with a greater than 80% left carotid  stenosis.   PLAN:  I have recommended left carotid endarterectomy in order to lower his  risk of future stroke.  We have discussed the indications for left carotid  endarterectomy and the potential complications, including but not limited  to, wound healing problems, bleeding, stroke (perioperative risk 1 to 2%),  MI, nerve injury, or other unpredictable medical  problems.  All of his  questions were answered, and he was agreeable to proceed; his surgery has  been scheduled for August 12, 2002.  He knows to continue taking his aspirin  right up to surgery.                                               Di Kindle. Edilia Bo, M.D.    CSD/MEDQ  D:  07/28/2002  T:  07/28/2002  Job:  [id number]

## 2010-06-18 ENCOUNTER — Encounter (HOSPITAL_COMMUNITY): Payer: BC Managed Care – PPO

## 2010-06-20 ENCOUNTER — Encounter (HOSPITAL_COMMUNITY): Payer: BC Managed Care – PPO

## 2010-06-22 ENCOUNTER — Encounter (HOSPITAL_COMMUNITY): Payer: BC Managed Care – PPO

## 2010-06-25 ENCOUNTER — Encounter (HOSPITAL_COMMUNITY): Payer: BC Managed Care – PPO

## 2010-06-27 ENCOUNTER — Encounter (HOSPITAL_COMMUNITY): Payer: BC Managed Care – PPO

## 2010-06-29 ENCOUNTER — Encounter (HOSPITAL_COMMUNITY): Payer: BC Managed Care – PPO

## 2010-07-02 ENCOUNTER — Encounter (HOSPITAL_COMMUNITY): Payer: BC Managed Care – PPO

## 2010-07-04 ENCOUNTER — Encounter (HOSPITAL_COMMUNITY): Payer: BC Managed Care – PPO

## 2010-07-06 ENCOUNTER — Encounter (HOSPITAL_COMMUNITY): Payer: BC Managed Care – PPO

## 2010-07-09 ENCOUNTER — Encounter (HOSPITAL_COMMUNITY): Payer: BC Managed Care – PPO

## 2010-07-11 ENCOUNTER — Encounter (HOSPITAL_COMMUNITY): Payer: BC Managed Care – PPO

## 2010-07-13 ENCOUNTER — Encounter (HOSPITAL_COMMUNITY): Payer: BC Managed Care – PPO

## 2010-07-16 ENCOUNTER — Encounter (HOSPITAL_COMMUNITY): Payer: BC Managed Care – PPO

## 2010-07-18 ENCOUNTER — Encounter (HOSPITAL_COMMUNITY): Payer: BC Managed Care – PPO

## 2010-07-20 ENCOUNTER — Encounter (HOSPITAL_COMMUNITY): Payer: BC Managed Care – PPO

## 2010-07-23 ENCOUNTER — Encounter (HOSPITAL_COMMUNITY): Payer: BC Managed Care – PPO

## 2010-07-25 ENCOUNTER — Encounter (HOSPITAL_COMMUNITY): Payer: BC Managed Care – PPO

## 2010-07-27 ENCOUNTER — Encounter (HOSPITAL_COMMUNITY): Payer: BC Managed Care – PPO

## 2010-07-30 ENCOUNTER — Encounter (HOSPITAL_COMMUNITY): Payer: BC Managed Care – PPO

## 2010-08-01 ENCOUNTER — Encounter (HOSPITAL_COMMUNITY): Payer: BC Managed Care – PPO

## 2010-08-03 ENCOUNTER — Encounter (HOSPITAL_COMMUNITY): Payer: BC Managed Care – PPO

## 2010-08-06 ENCOUNTER — Encounter (HOSPITAL_COMMUNITY): Payer: BC Managed Care – PPO

## 2010-08-08 ENCOUNTER — Encounter (HOSPITAL_COMMUNITY): Payer: BC Managed Care – PPO

## 2010-08-10 ENCOUNTER — Encounter (HOSPITAL_COMMUNITY): Payer: BC Managed Care – PPO

## 2010-10-29 LAB — HEMOGLOBIN AND HEMATOCRIT, BLOOD
HCT: 42.2
HCT: 44
Hemoglobin: 13.7
Hemoglobin: 14.4

## 2010-10-29 LAB — COMPREHENSIVE METABOLIC PANEL
ALT: 24
AST: 20
CO2: 29
Calcium: 9.7
GFR calc Af Amer: >60
Sodium: 139
Total Protein: 6.8

## 2010-10-29 LAB — DIFFERENTIAL
Eosinophils Absolute: 0.1
Eosinophils Relative: 2
Lymphs Abs: 1.4
Monocytes Absolute: 0.5
Monocytes Relative: 10

## 2010-10-29 LAB — CBC
MCHC: 34.5
RBC: 4.98
RDW: 14.1

## 2010-10-29 LAB — URINALYSIS, ROUTINE W REFLEX MICROSCOPIC
Glucose, UA: NEGATIVE
Hgb urine dipstick: NEGATIVE
Specific Gravity, Urine: 1.014
Urobilinogen, UA: 0.2

## 2010-12-11 ENCOUNTER — Other Ambulatory Visit: Payer: Self-pay | Admitting: Oncology

## 2010-12-11 ENCOUNTER — Other Ambulatory Visit (HOSPITAL_BASED_OUTPATIENT_CLINIC_OR_DEPARTMENT_OTHER): Payer: Medicare Other | Admitting: Lab

## 2010-12-11 DIAGNOSIS — D472 Monoclonal gammopathy: Secondary | ICD-10-CM

## 2010-12-13 LAB — PROTEIN ELECTROPHORESIS, SERUM
Alpha-1-Globulin: 4.4 % (ref 2.9–4.9)
Beta 2: 3.8 % (ref 3.2–6.5)
Beta Globulin: 5.5 % (ref 4.7–7.2)
Gamma Globulin: 19.2 % — ABNORMAL HIGH (ref 11.1–18.8)

## 2011-01-07 ENCOUNTER — Telehealth: Payer: Self-pay | Admitting: *Deleted

## 2011-01-07 NOTE — Telephone Encounter (Signed)
patient's wife confirmed over the phone the new date and time lab 06-10-2011 at 10:00am 06-17-2011 at 12:00pm

## 2011-05-25 IMAGING — CR DG LUMBAR SPINE 2-3V
2 series · 2 of 2 positions shown · non-contrast
Comparison: Lumbar spine films of 07/18/2009

CLINICAL DATA: Fell 10 days ago with back pain and hip pain

LUMBAR SPINE - 2-3 VIEW

[t l-spine a.p.]
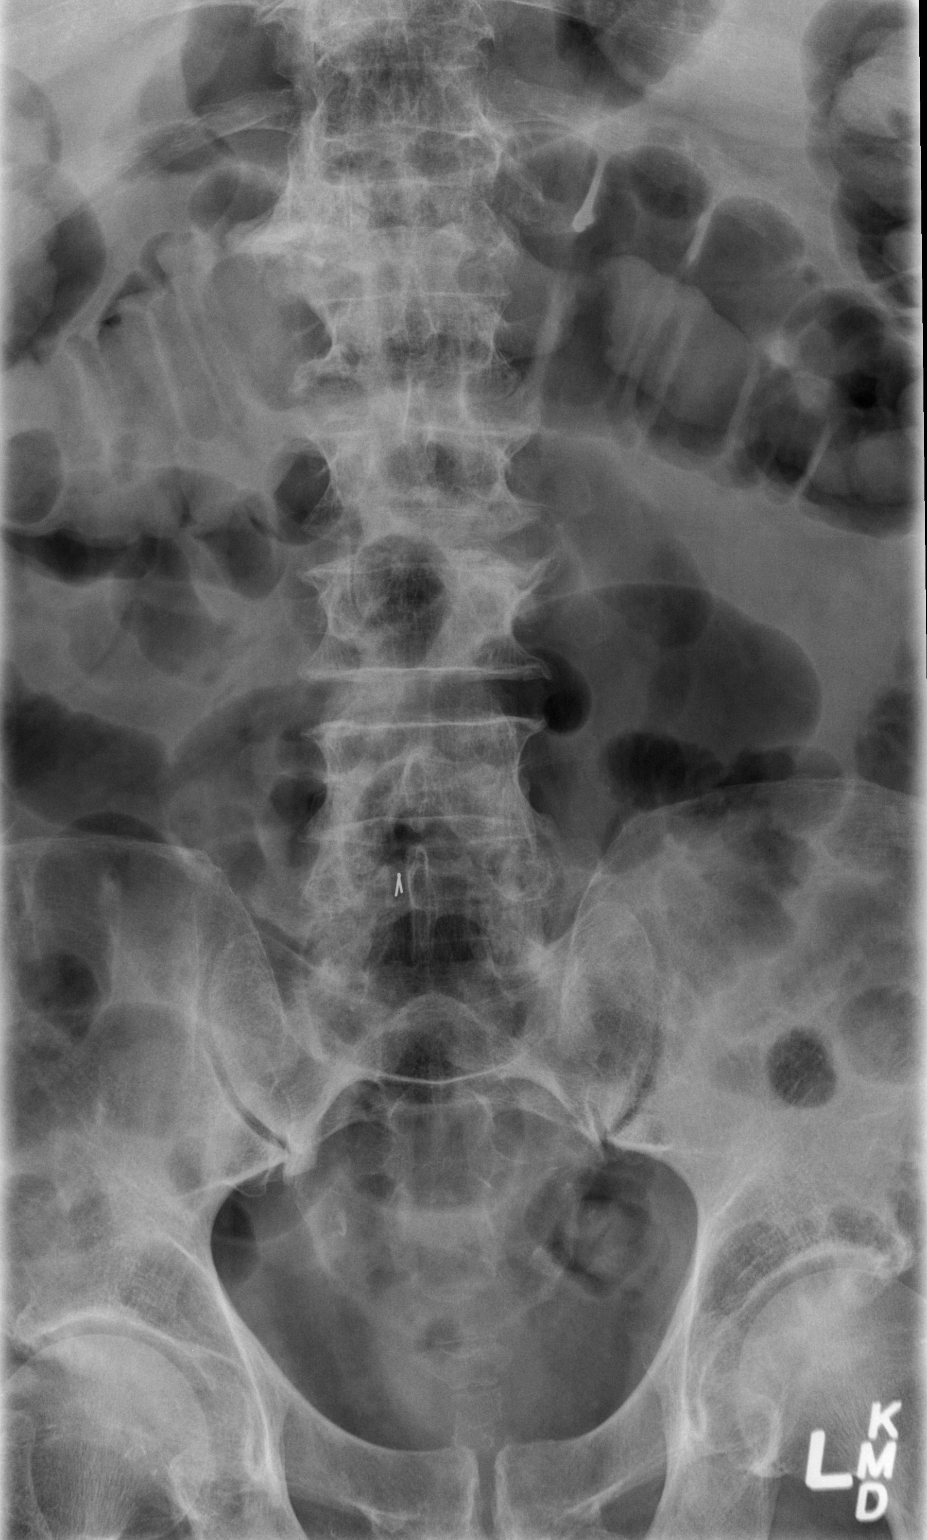

[t l-spine lat *]
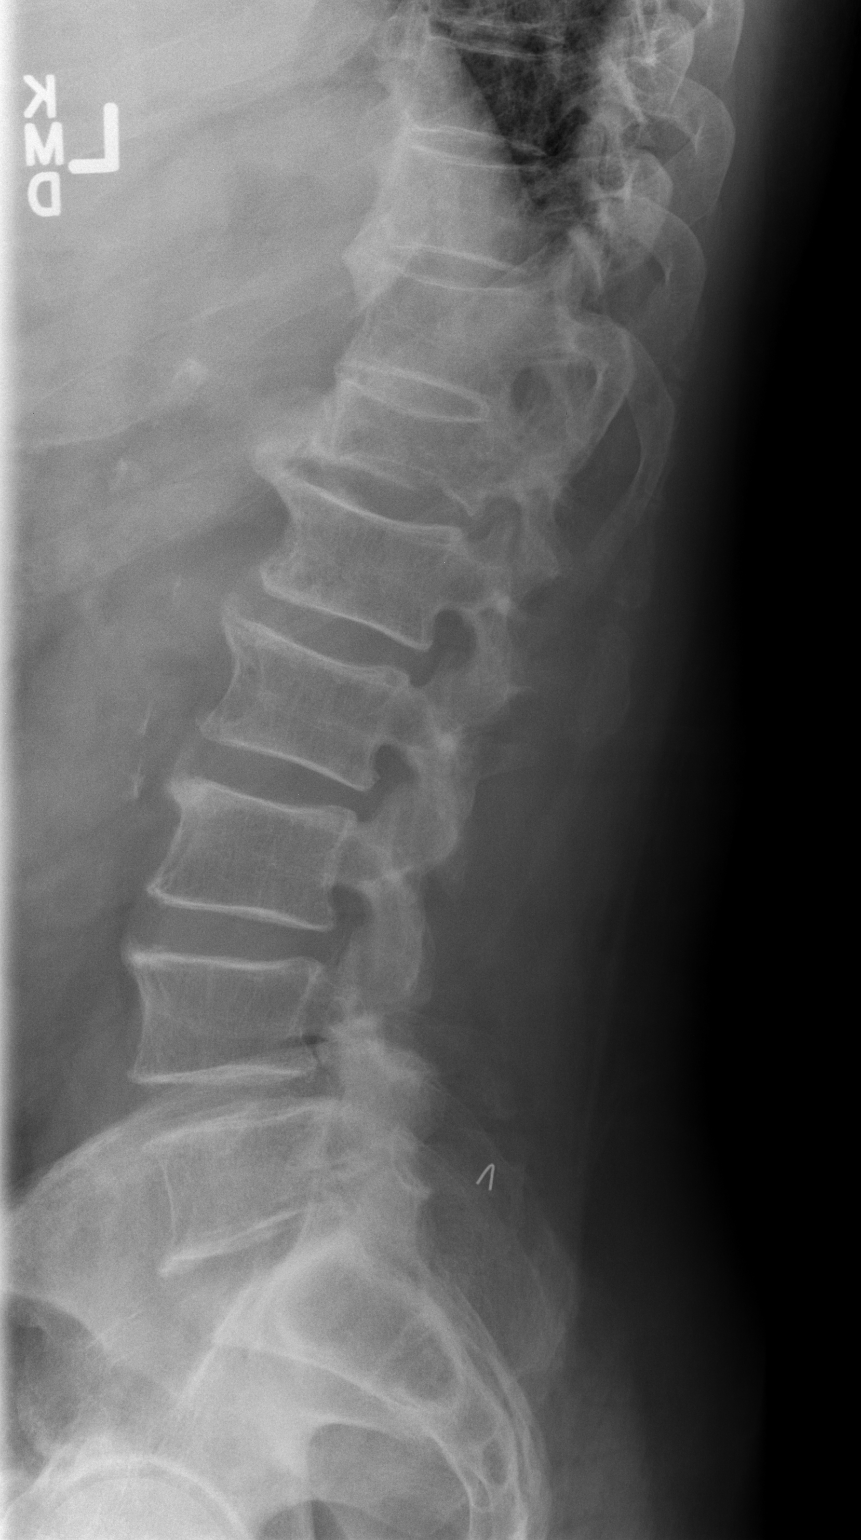

[2 of 2 positions shown; findings below may reference images not displayed]

FINDINGS: In the interval, there is now a compression deformity of
T12 vertebral body of approximately 20%.  No definite retropulsion
is seen.  The bones are diffusely osteopenic and there is anterior
osteophyte formation present.  Intervertebral disc spaces are
normal.
IMPRESSION: New approximately 20% compression deformity of T12.  No definite
retropulsion is seen.

## 2011-05-25 IMAGING — CR DG THORACIC SPINE 2V
2 series · 2 of 2 positions shown · non-contrast
Comparison: None.

CLINICAL DATA: Fell 10 days ago with pain

THORACIC SPINE - 2 VIEW

[t t-spine a.p.]
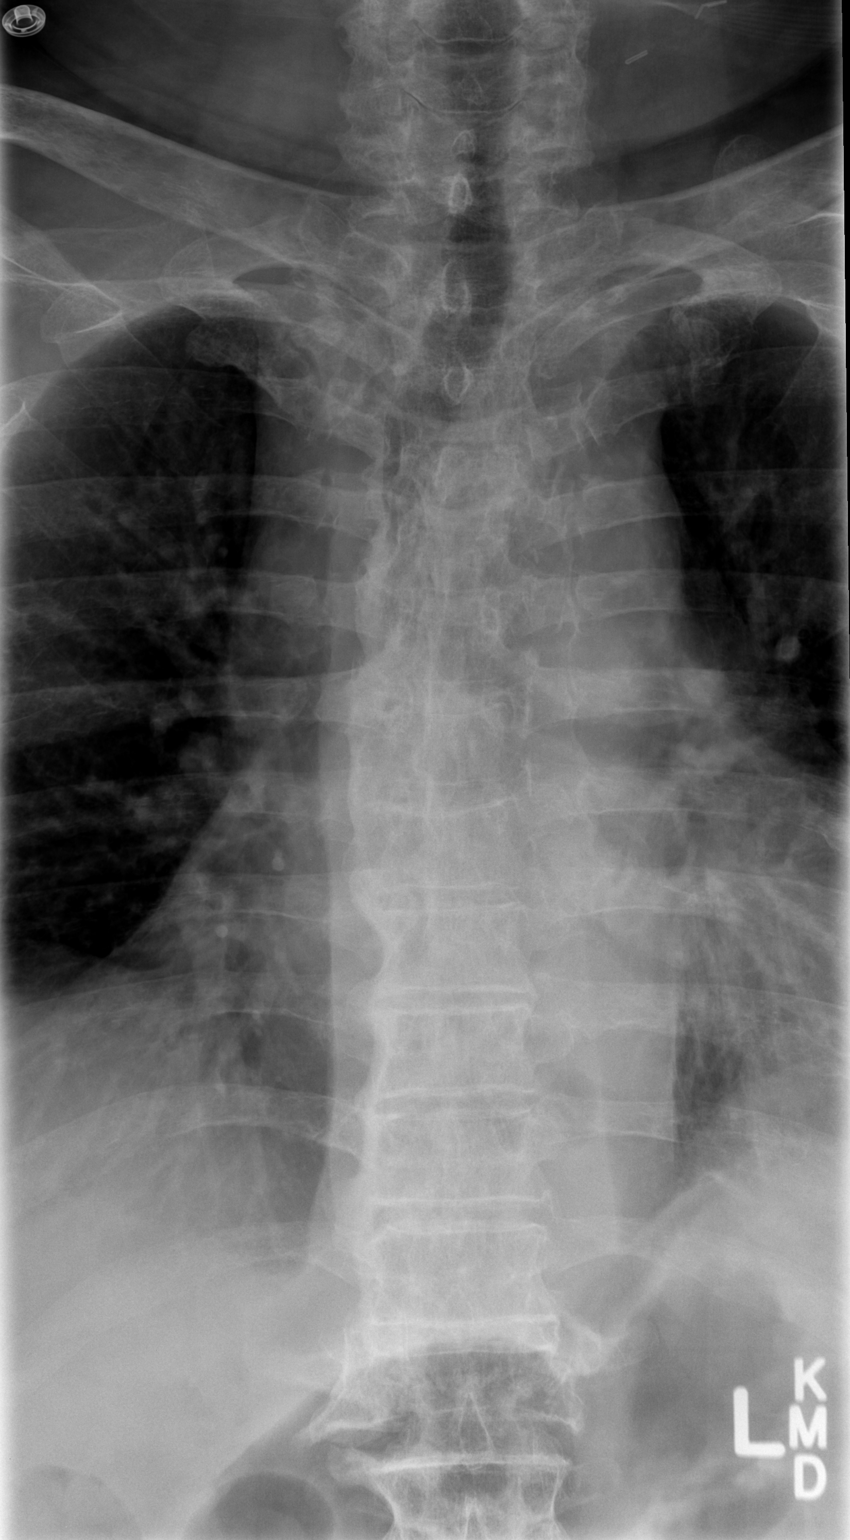

[t t-spine lat *]
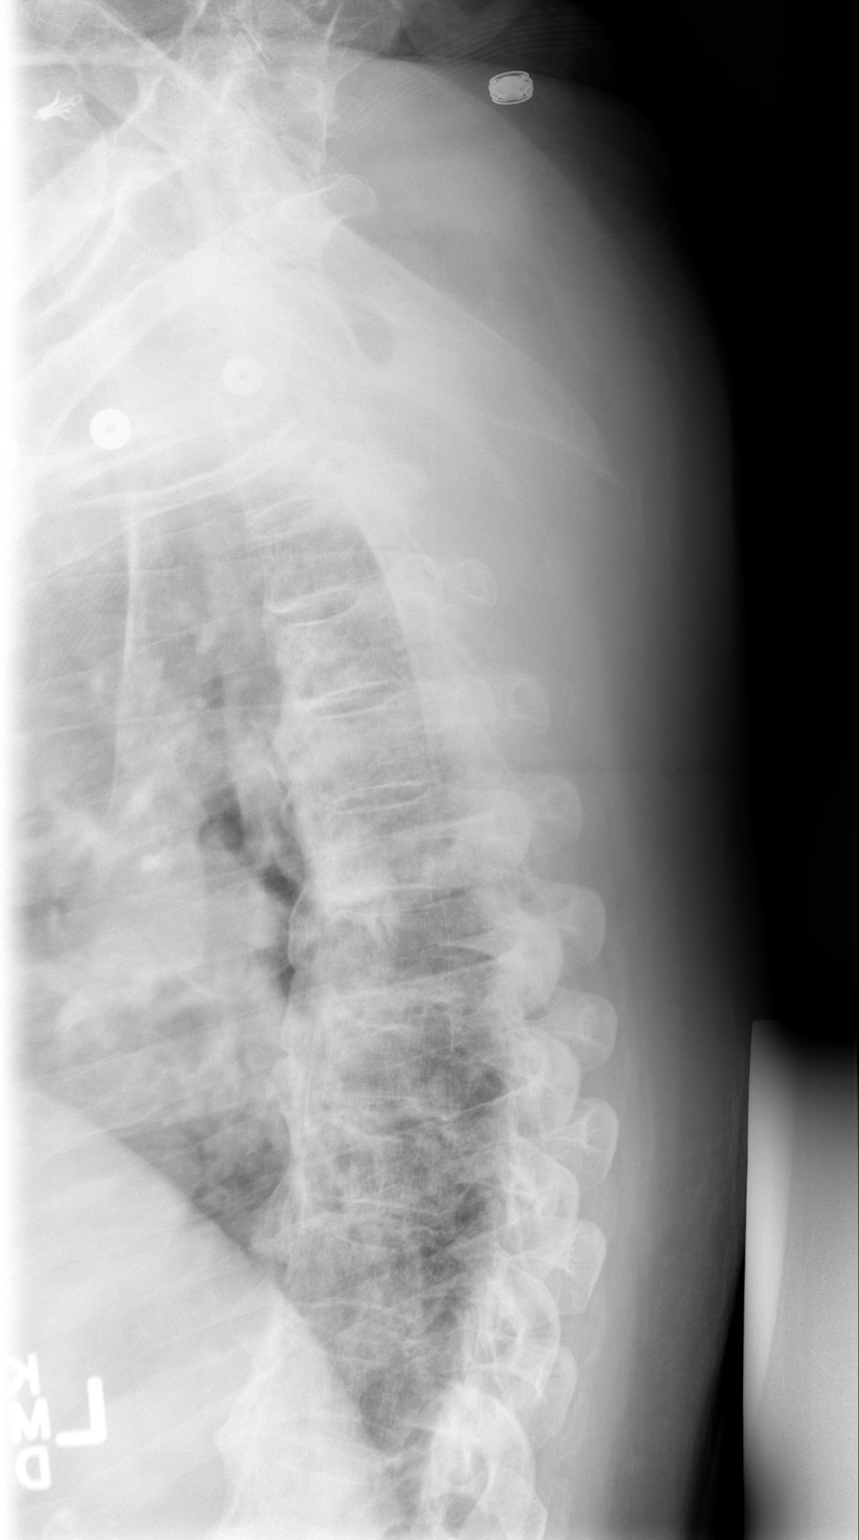

[2 of 2 positions shown; findings below may reference images not displayed]

FINDINGS: The thoracic vertebra are diffusely osteopenic.  There
are diffuse degenerative changes present.  No acute abnormality is
seen.  The compression deformity of T12 vertebral body is better
seen on lumbar spine films.
IMPRESSION: Compression deformity of T12 better seen on lumbar spine films.
Diffuse osteopenia and degenerative change.

## 2011-05-25 IMAGING — CR DG CHEST 2V
2 series · 2 of 2 positions shown · non-contrast
Comparison: Chest x-ray of 10/26/2007

CLINICAL DATA: Fell 10 days ago with pain

CHEST - 2 VIEW

[t chest supine]
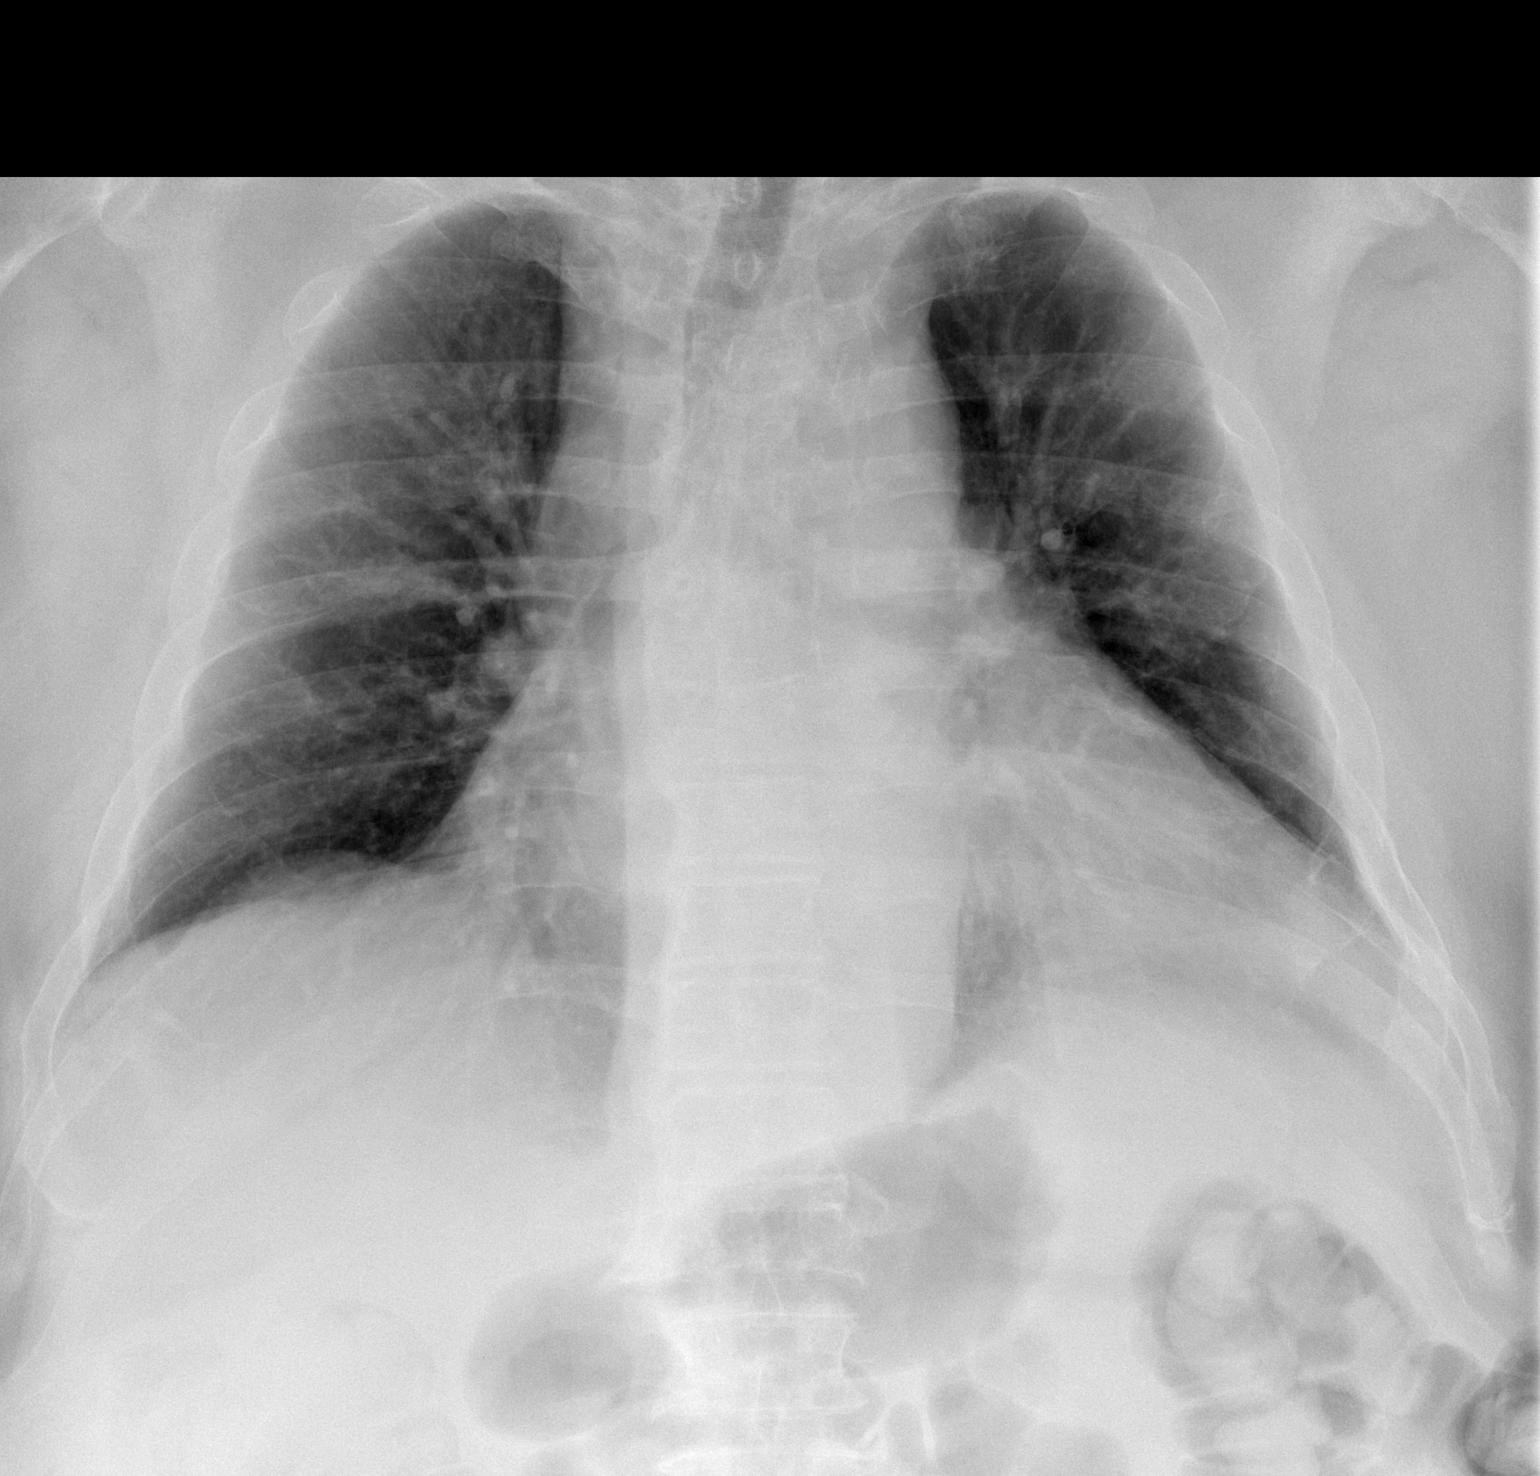

[w chest lat]
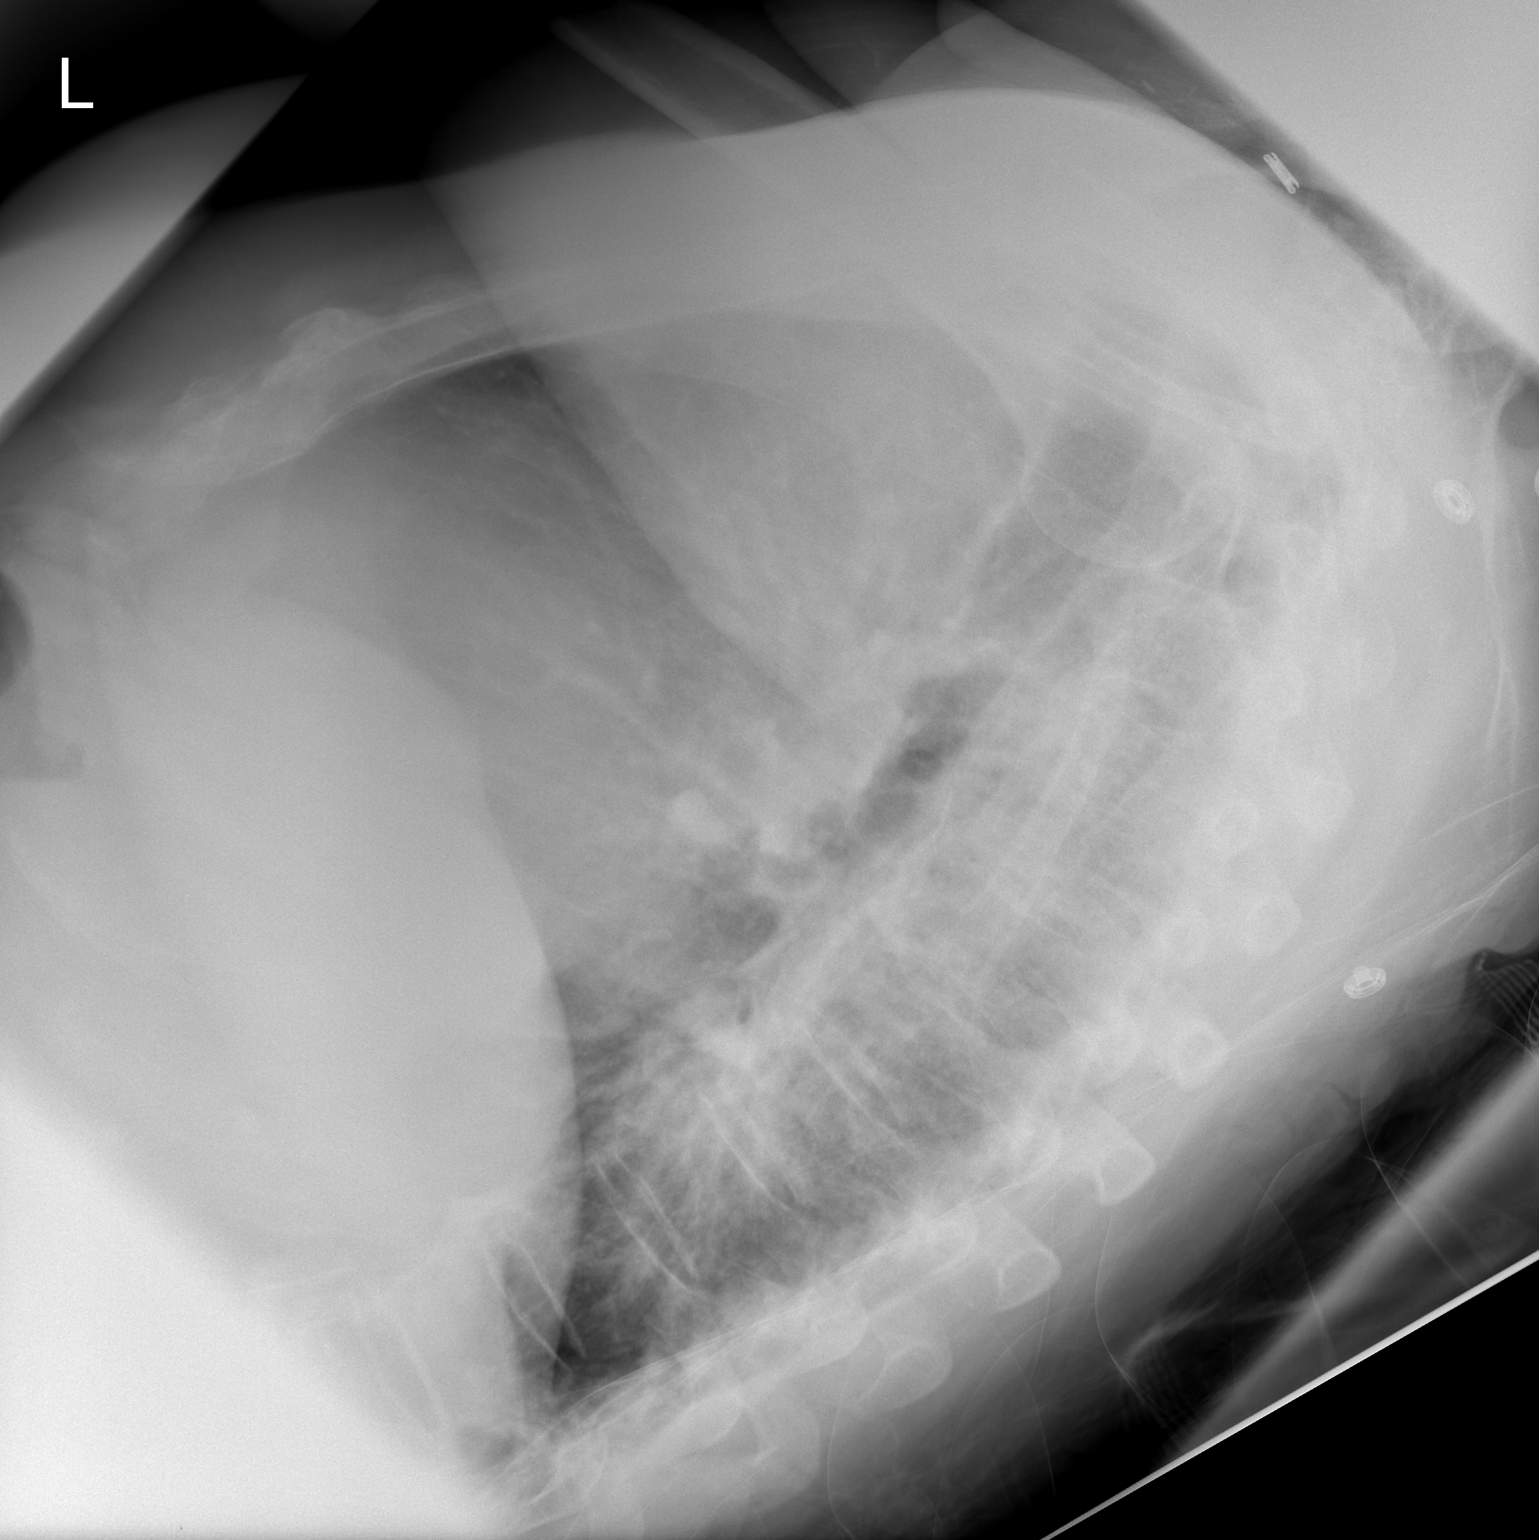

[2 of 2 positions shown; findings below may reference images not displayed]

FINDINGS: The lungs are clear.  Cardiomegaly is stable.  The bones
are diffusely osteopenic.
IMPRESSION: Stable cardiomegaly.  No active lung disease.

## 2011-06-10 ENCOUNTER — Other Ambulatory Visit (HOSPITAL_BASED_OUTPATIENT_CLINIC_OR_DEPARTMENT_OTHER): Payer: Medicare Other | Admitting: Lab

## 2011-06-10 DIAGNOSIS — D472 Monoclonal gammopathy: Secondary | ICD-10-CM

## 2011-06-10 LAB — CBC WITH DIFFERENTIAL/PLATELET
BASO%: 0.8 % (ref 0.0–2.0)
Basophils Absolute: 0.1 10*3/uL (ref 0.0–0.1)
Eosinophils Absolute: 0.2 10*3/uL (ref 0.0–0.5)
HCT: 46.4 % (ref 38.4–49.9)
HGB: 16 g/dL (ref 13.0–17.1)
LYMPH%: 28.2 % (ref 14.0–49.0)
MCHC: 34.5 g/dL (ref 32.0–36.0)
MONO#: 0.6 10*3/uL (ref 0.1–0.9)
NEUT%: 59 % (ref 39.0–75.0)
Platelets: 146 10*3/uL (ref 140–400)
WBC: 6.2 10*3/uL (ref 4.0–10.3)

## 2011-06-12 LAB — COMPREHENSIVE METABOLIC PANEL
Albumin: 4.1 g/dL (ref 3.5–5.2)
BUN: 23 mg/dL (ref 6–23)
CO2: 29 mEq/L (ref 19–32)
Calcium: 9.4 mg/dL (ref 8.4–10.5)
Chloride: 103 mEq/L (ref 96–112)
Creatinine, Ser: 1.08 mg/dL (ref 0.50–1.35)
Glucose, Bld: 122 mg/dL — ABNORMAL HIGH (ref 70–99)
Potassium: 4 mEq/L (ref 3.5–5.3)

## 2011-06-12 LAB — KAPPA/LAMBDA LIGHT CHAINS
Kappa free light chain: 1.92 mg/dL (ref 0.33–1.94)
Kappa:Lambda Ratio: 2.63 — ABNORMAL HIGH (ref 0.26–1.65)
Lambda Free Lght Chn: 0.73 mg/dL (ref 0.57–2.63)

## 2011-06-12 LAB — PROTEIN ELECTROPHORESIS, SERUM
Gamma Globulin: 18.8 % (ref 11.1–18.8)
M-Spike, %: 0.76 g/dL
Total Protein, Serum Electrophoresis: 6.8 g/dL (ref 6.0–8.3)

## 2011-06-17 ENCOUNTER — Telehealth: Payer: Self-pay | Admitting: *Deleted

## 2011-06-17 ENCOUNTER — Other Ambulatory Visit: Payer: Self-pay | Admitting: Oncology

## 2011-06-17 ENCOUNTER — Ambulatory Visit (HOSPITAL_BASED_OUTPATIENT_CLINIC_OR_DEPARTMENT_OTHER): Payer: Medicare Other | Admitting: Oncology

## 2011-06-17 VITALS — BP 169/77 | HR 66 | Temp 97.9°F | Ht 65.0 in | Wt 271.9 lb

## 2011-06-17 DIAGNOSIS — M549 Dorsalgia, unspecified: Secondary | ICD-10-CM

## 2011-06-17 DIAGNOSIS — D472 Monoclonal gammopathy: Secondary | ICD-10-CM

## 2011-06-17 NOTE — Progress Notes (Signed)
ID: [name] [name]   DOB: January 12, 1933  MR#: [id number]  VHQ#:[id number]  HISTORY OF PRESENT ILLNESS: [name] [name] fell in 06/2009, and was evaluated in the emergency room with plain films of the lumbar spine, which showed no acute abnormality.  A week later he presented with intractable pain to Dr. Darrelyn Hillock, and had to be admitted for further evaluation and for pain control.  Films on 07/02 showed a new compression deformity of T12.  There was no obvious retropulsion.  There was diffuse osteopenia, and a possible radiolucent lesion was noted in the left femur.  This was further evaluated on 07/05, and no radiolucent lesion was seen by CT scan.  The patient has had a fairly protracted course since that time.  He was at [facility] nursing home for a while.  He is now back home.  Uses a walker, and he gets around fairly well with that according to the wife.  Further evaluation is planned through Dr. Darrelyn Hillock and Dr. Venita Lick.  As part of his workup in the hospital in early July, the patient had an immunofixation which showed slightly low IgA at 50, slightly low IgM at 34, and a normal IgG at 1400 with a monoclonal IgG kappa paraprotein.  The M-spike at that time, on 07/02, was 0.84 g/dL.  Urine immunofixation showed the IgG kappa, but no Bence-Jones proteins.  Free kappa light chains were 2.16, free lambda light chains 0.10. His subsequent history is as detailed below.  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 The interval history is unremarkable. 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 Asked what his worse problem is he really doesn't have a worse problem currently although occasionally he still has some low back pain.  REVIEW OF SYSTEMS: He is having some sinus issues, feels his hearing is not as good as it used to be, his feet swell at times, not one more than another, and of course he short of breath with  walking up stairs or up a slope. He keeps a dry cough. This is not a major issue for him. He bruises easily due to the aspirin is taking and again he does have a history of gout and a history of significant arthritis including some compression fractures of which are old. A detailed review of systems though today was entirely stable.  PAST MEDICAL HISTORY: Hypertension, gout, history of nephrolithiasis, history of hypothyroidism, history of L3-L4 and L4-L5 decompression 10/2005 under Dr. Darrelyn Hillock, history of remote appendectomy, history of right herniorrhaphy, history of left arthroscopic knee surgery under Ollen Gross, history of right shoulder surgery, history of complete knee replacement 2006 under Homero Fellers Aluisio, left carotid endarterectomy under Cari Caraway in 2004.  FAMILY HISTORY The patient's father died from a myocardial infarction at the age of 40.  The patient's mother died from colon cancer at the age of 84.  The patient is 1 of 4 siblings.  There is no other history of cancer in the immediate family, and no history of myeloma or lymphatic tumors.  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  His wife of 51 years, [name],  used to be Teacher, English as a foreign language for the [employer].  Their only child, daughter [name] [name], , is working in the guidance office at [employer].  The patient has one grandchild.  He attends [employer].   ADVANCED DIRECTIVES:  in place  HEALTH MAINTENANCE: History  Substance Use Topics  . Smoking status: Not on file  . Smokeless tobacco: Not on file  . Alcohol Use: Not on file     Allergies not on file  Current Outpatient Prescriptions  Medication Sig Dispense Refill  . allopurinol (ZYLOPRIM) 300 MG tablet       . bisoprolol (ZEBETA) 10 MG tablet       . CRESTOR 20 MG tablet       . lisinopril-hydrochlorothiazide (PRINZIDE,ZESTORETIC) 10-12.5 MG per tablet       . LOVAZA 1 G capsule       . SYNTHROID 100  MCG tablet       . Tamsulosin HCl (FLOMAX) 0.4 MG CAPS       . Vitamin D, Ergocalciferol, (DRISDOL) 50000 UNITS CAPS         OBJECTIVE: Elderly white male in no acute distress Filed Vitals:   06/17/11 1205  BP: 169/77  Pulse: 66  Temp: 97.9 F (36.6 C)     Body mass index is 45.25 kg/(m^2).    ECOG FS: 2  Sclerae unicteric Oropharynx clear No peripheral adenopathy Lungs no rales or rhonchi Heart regular rate and rhythm Abd obese, nontender, positive bowel sounds MSK no focal spinal tenderness despite significant scoliosis Neuro: nonfocal   LAB RESULTS: Repeat SPEP, kappa/lambda light chains, and beta-2 microglobulin are entirely stable as compared to 6 months and 12 months ago.  Lab Results  Component Value Date   WBC 6.2 06/10/2011   NEUTROABS 3.7 06/10/2011   HGB 16.0 06/10/2011   HCT 46.4 06/10/2011   MCV 96.6 06/10/2011   PLT 146 06/10/2011      Chemistry      Component Value Date/Time   NA 140 06/10/2011 0952   K 4.0 06/10/2011 0952   CL 103 06/10/2011 0952   CO2 29 06/10/2011 0952   BUN 23 06/10/2011 0952   CREATININE 1.08 06/10/2011 0952      Component Value Date/Time   CALCIUM 9.4 06/10/2011 0952   ALKPHOS 52 06/10/2011 0952   AST 25 06/10/2011 0952   ALT 20 06/10/2011 0952   BILITOT 0.7 06/10/2011 0952       No results found for this basename: LABCA2    No components found with this basename: LABCA125    No results found for this basename: INR:1;PROTIME:1 in the last 168 hours  Urinalysis    Component Value Date/Time   COLORURINE YELLOW 04/06/2010 1832   APPEARANCEUR CLEAR 04/06/2010 1832   LABSPEC 1.009 04/06/2010 1832   PHURINE 7.5 04/06/2010 1832   GLUCOSEU NEGATIVE 04/06/2010 1832   HGBUR NEGATIVE 04/06/2010 1832   BILIRUBINUR NEGATIVE 04/06/2010 1832   KETONESUR NEGATIVE 04/06/2010 1832   PROTEINUR NEGATIVE 04/06/2010 1832   UROBILINOGEN 0.2 04/06/2010 1832   NITRITE NEGATIVE 04/06/2010 1832   LEUKOCYTESUR NEGATIVE 04/06/2010 1832    STUDIES: No results  found.  ASSESSMENT: 78 year old [place] man with a small M spike first noted July 2011 and stable since that time, consistent with M-GUS  PLAN: [name] is doing fine from my point of view. He will have lab work here in 6 months, then again in 12 months and see me one more time. If everything remains stable then, I will release him from followup here. He knows to call for any problems that may develop before then that he thinks may be related to his monoclonal gammopathy.   Seanpatrick Maisano C    06/17/2011

## 2011-06-17 NOTE — Telephone Encounter (Signed)
gave patient appointmnent for 6 month lab only and one year appointment with labs on e week before the md appontment printed out calendar and gave to the patient

## 2011-12-11 ENCOUNTER — Other Ambulatory Visit (HOSPITAL_BASED_OUTPATIENT_CLINIC_OR_DEPARTMENT_OTHER): Payer: Medicare Other | Admitting: Lab

## 2011-12-11 DIAGNOSIS — D472 Monoclonal gammopathy: Secondary | ICD-10-CM

## 2011-12-11 LAB — CBC WITH DIFFERENTIAL/PLATELET
Basophils Absolute: 0.1 10*3/uL (ref 0.0–0.1)
EOS%: 1.6 % (ref 0.0–7.0)
Eosinophils Absolute: 0.1 10*3/uL (ref 0.0–0.5)
HCT: 49.5 % (ref 38.4–49.9)
HGB: 17.1 g/dL (ref 13.0–17.1)
MCH: 33.6 pg — ABNORMAL HIGH (ref 27.2–33.4)
MONO#: 0.5 10*3/uL (ref 0.1–0.9)
NEUT#: 3.7 10*3/uL (ref 1.5–6.5)
NEUT%: 63.3 % (ref 39.0–75.0)
RDW: 14.8 % — ABNORMAL HIGH (ref 11.0–14.6)
lymph#: 1.5 10*3/uL (ref 0.9–3.3)

## 2011-12-11 LAB — COMPREHENSIVE METABOLIC PANEL (CC13)
ALT: 28 U/L (ref 0–55)
AST: 24 U/L (ref 5–34)
Albumin: 3.5 g/dL (ref 3.5–5.0)
Alkaline Phosphatase: 55 U/L (ref 40–150)
Potassium: 4.4 mEq/L (ref 3.5–5.1)
Sodium: 138 mEq/L (ref 136–145)
Total Protein: 6.8 g/dL (ref 6.4–8.3)

## 2011-12-13 LAB — PROTEIN ELECTROPHORESIS, SERUM
Albumin ELP: 58.5 % (ref 55.8–66.1)
Alpha-2-Globulin: 12.1 % — ABNORMAL HIGH (ref 7.1–11.8)
Beta Globulin: 6.1 % (ref 4.7–7.2)
Total Protein, Serum Electrophoresis: 7.1 g/dL (ref 6.0–8.3)

## 2011-12-13 LAB — KAPPA/LAMBDA LIGHT CHAINS
Kappa free light chain: 1.48 mg/dL (ref 0.33–1.94)
Kappa:Lambda Ratio: 2.11 — ABNORMAL HIGH (ref 0.26–1.65)
Lambda Free Lght Chn: 0.7 mg/dL (ref 0.57–2.63)

## 2012-02-03 IMAGING — CR DG CHEST 1V PORT
1 series · 1 of 1 positions shown · non-contrast
Comparison: 04/06/2010

CLINICAL DATA: Status post CABG

PORTABLE CHEST - 1 VIEW

[view not recorded]
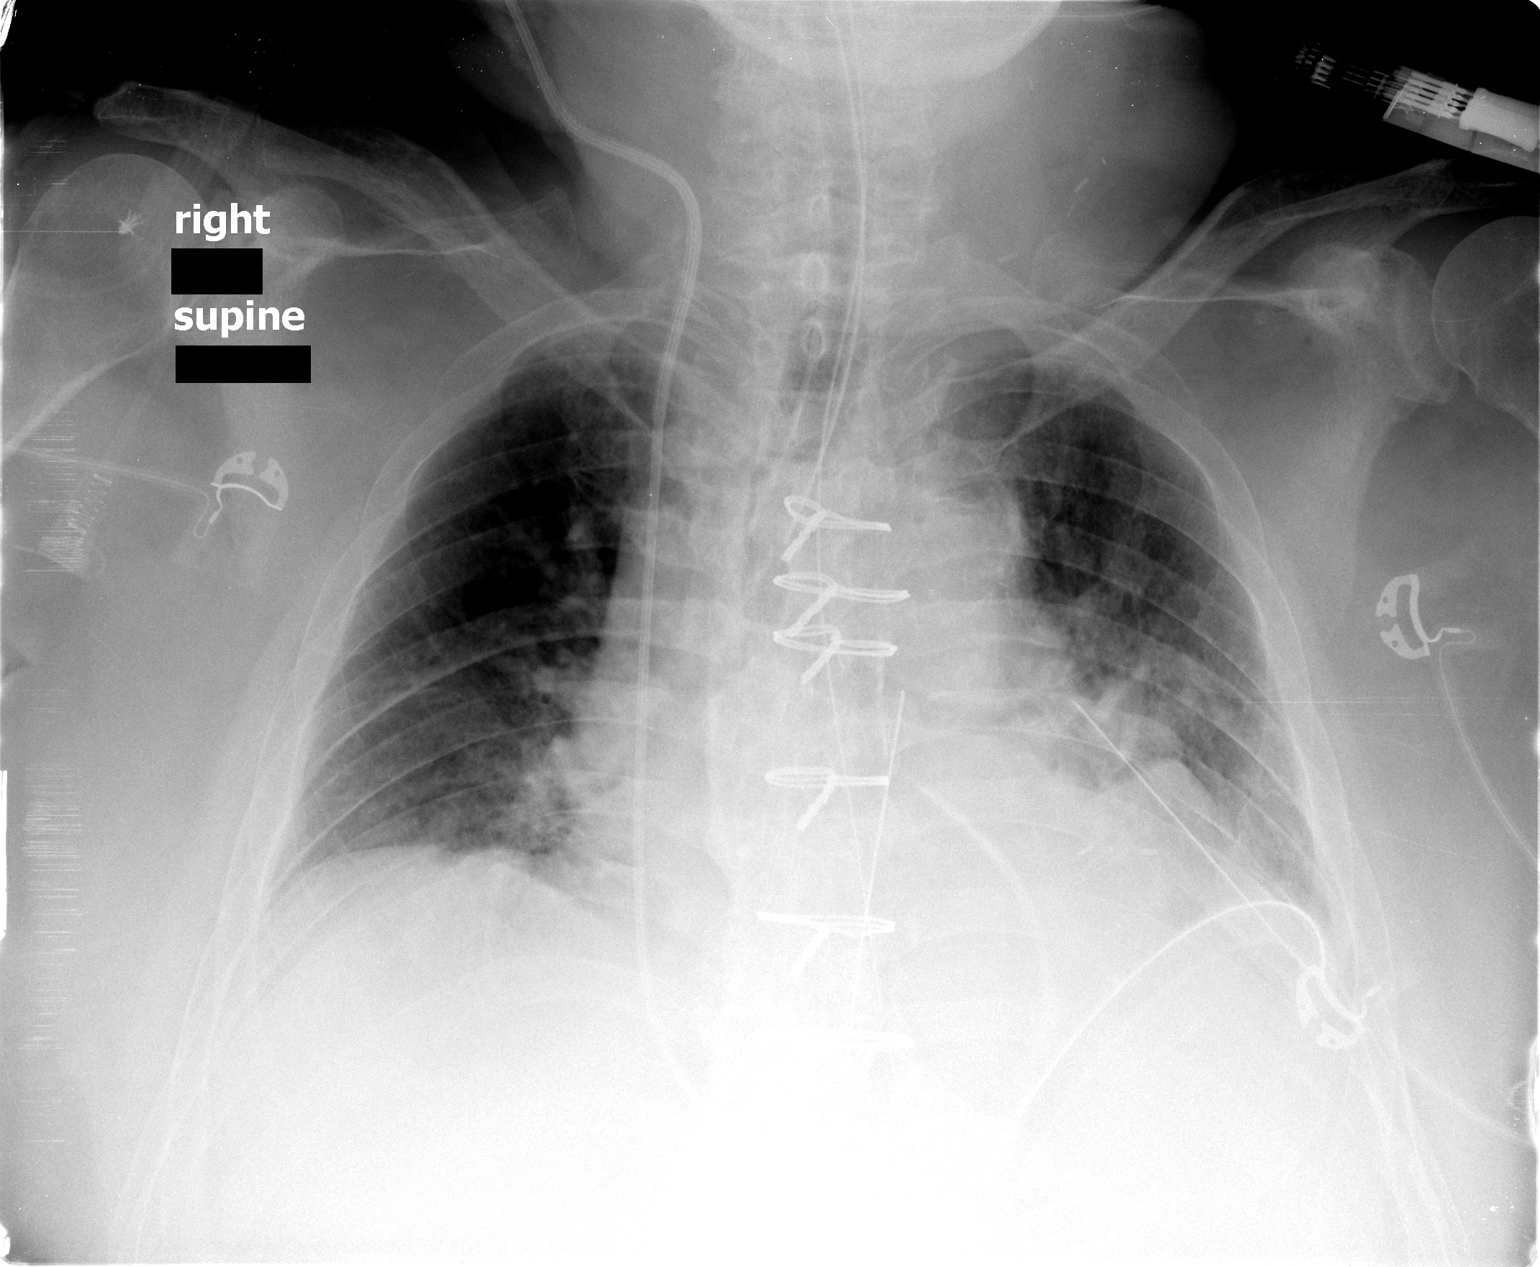

[1 of 1 positions shown; findings below may reference images not displayed]

FINDINGS: Low lung volumes with pulmonary vascular congestion and
mild interstitial edema.  Bibasilar atelectasis.

Endotracheal tube in the mid thoracic trachea. Right IJ Swan-Ganz
catheter with its tip in the main pulmonary artery.  Left chest
tube without definite pneumothorax.  Mediastinal drain.
Postsurgical changes related to prior CABG.
IMPRESSION: Low lung volumes with pulmonary vascular congestion, interstitial
edema, and bibasilar atelectasis.

Endotracheal tube in satisfactory position. Left chest tube without
definite pneumothorax.

## 2012-05-13 ENCOUNTER — Telehealth: Payer: Self-pay | Admitting: *Deleted

## 2012-05-13 NOTE — Telephone Encounter (Signed)
sw with [name] [name] she wanted to change his ov appt, and to keep thr lab appt as scheduled. gv appt d/t for 06/24/12@ 11am.the patient is aware.td

## 2012-05-13 NOTE — Telephone Encounter (Signed)
Wife called that she needs to reschedule his 5/20 OV to another day. Expresses frustration that she has called several times with no response. Made her aware nurse will take message directly to scheduler and request she be called soon.

## 2012-06-09 ENCOUNTER — Other Ambulatory Visit (HOSPITAL_BASED_OUTPATIENT_CLINIC_OR_DEPARTMENT_OTHER): Payer: Medicare Other | Admitting: Lab

## 2012-06-09 DIAGNOSIS — D472 Monoclonal gammopathy: Secondary | ICD-10-CM

## 2012-06-09 LAB — CBC WITH DIFFERENTIAL/PLATELET
BASO%: 0.5 % (ref 0.0–2.0)
HCT: 45.4 % (ref 38.4–49.9)
MCHC: 34.2 g/dL (ref 32.0–36.0)
MONO#: 0.5 10*3/uL (ref 0.1–0.9)
NEUT%: 59.6 % (ref 39.0–75.0)
WBC: 5.8 10*3/uL (ref 4.0–10.3)
lymph#: 1.7 10*3/uL (ref 0.9–3.3)

## 2012-06-09 LAB — COMPREHENSIVE METABOLIC PANEL (CC13)
AST: 20 U/L (ref 5–34)
Alkaline Phosphatase: 50 U/L (ref 40–150)
BUN: 24.1 mg/dL (ref 7.0–26.0)
Creatinine: 1.1 mg/dL (ref 0.7–1.3)

## 2012-06-11 LAB — BETA 2 MICROGLOBULIN, SERUM: Beta-2 Microglobulin: 2.27 mg/L — ABNORMAL HIGH (ref 1.01–1.73)

## 2012-06-11 LAB — PROTEIN ELECTROPHORESIS, SERUM
Alpha-1-Globulin: 3.6 % (ref 2.9–4.9)
Beta 2: 3.4 % (ref 3.2–6.5)
Gamma Globulin: 17.9 % (ref 11.1–18.8)

## 2012-06-11 LAB — KAPPA/LAMBDA LIGHT CHAINS: Kappa:Lambda Ratio: 1.33 (ref 0.26–1.65)

## 2012-06-16 ENCOUNTER — Ambulatory Visit: Payer: Medicare Other | Admitting: Oncology

## 2012-06-24 ENCOUNTER — Ambulatory Visit (HOSPITAL_BASED_OUTPATIENT_CLINIC_OR_DEPARTMENT_OTHER): Payer: Medicare Other | Admitting: Oncology

## 2012-06-24 VITALS — BP 158/68 | HR 65 | Temp 98.7°F | Resp 20 | Ht 65.0 in | Wt 272.4 lb

## 2012-06-24 DIAGNOSIS — D472 Monoclonal gammopathy: Secondary | ICD-10-CM

## 2012-06-24 DIAGNOSIS — M549 Dorsalgia, unspecified: Secondary | ICD-10-CM

## 2012-06-24 NOTE — Progress Notes (Signed)
ID: [name]   DOB: 07-16-1932  MR#: [id number]  WUJ#:[id number]  PCP: Aida Puffer, MD   HISTORY OF PRESENT ILLNESS: Mr. Frankowski fell in 06/2009, and was evaluated in the emergency room with plain films of the lumbar spine, which showed no acute abnormality.  A week later he presented with intractable pain to Dr. Darrelyn Hillock, and had to be admitted for further evaluation and for pain control.  Films on 07/02 showed a new compression deformity of T12.  There was no obvious retropulsion.  There was diffuse osteopenia, and a possible radiolucent lesion was noted in the left femur.  This was further evaluated on 07/05, and no radiolucent lesion was seen by CT scan.  The patient has had a fairly protracted course since that time.  He was at [facility] nursing home for a while.  He is now back home.  Uses a walker, and he gets around fairly well with that according to the wife.  Further evaluation is planned through Dr. Darrelyn Hillock and Dr. Venita Lick.  As part of his workup in the hospital in early July, the patient had an immunofixation which showed slightly low IgA at 50, slightly low IgM at 34, and a normal IgG at 1400 with a monoclonal IgG kappa paraprotein.  The M-spike at that time, on 07/02, was 0.84 g/dL.  Urine immunofixation showed the IgG kappa, but no Bence-Jones proteins.  Free kappa light chains were 2.16, free lambda light chains 0.10. His subsequent history is as detailed below.  INTERVAL HISTORY: Mr. 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 The interval history is unremarkable. He "feels good" and is ready to "graduate" from followup here.  REVIEW OF SYSTEMS: He has chronic low back pain, which he treats with Tylenol. This is not more frequent or intense than prior. He does a lot of work in his shop, and a lot of gardening. He had a skin cancer removed from his right ear and he is up-to-date with his orthopedist Dr. Myra Rude and his cardiologist Dr. Jacinto Halim. There have  been no unusual headaches, dizziness, visual changes, nausea, or vomiting. There has been no rash, unexplained fever, or unexplained fatigue or weight loss. He denies peripheral neuropathy. A detailed review of systems today was otherwise stable  PAST MEDICAL HISTORY: Hypertension, gout, history of nephrolithiasis, history of hypothyroidism, history of L3-L4 and L4-L5 decompression 10/2005 under Dr. Darrelyn Hillock, history of remote appendectomy, history of right herniorrhaphy, history of left arthroscopic knee surgery under Ollen Gross, history of right shoulder surgery, history of complete knee replacement 2006 under Homero Fellers Aluisio, left carotid endarterectomy under Cari Caraway in 2004.  FAMILY HISTORY The patient's father died from a myocardial infarction at the age of 80.  The patient's mother died from colon cancer at the age of 17.  The patient is 1 of 4 siblings.  There is no other history of cancer in the immediate family, and no history of myeloma or lymphatic tumors.  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  His wife of 51 years, [name],  used to be Teacher, English as a foreign language for the [employer].  Their only child, daughter [name], , is working in the guidance office at [employer].  The patient has one grandchild.  He attends [employer].   ADVANCED DIRECTIVES: in place  HEALTH MAINTENANCE: History  Substance Use Topics  . Smoking status: Not on file  . Smokeless tobacco: Not on file  .  Alcohol Use: Not on file     Allergies  Allergen Reactions  . Hydrocodone Nausea Only  . Oxycodone Nausea Only  . Tramadol Nausea Only  . Cardizem (Diltiazem Hcl) Rash    Current Outpatient Prescriptions  Medication Sig Dispense Refill  . acetaminophen (TYLENOL) 650 MG CR tablet Take 650 mg by mouth as needed.      [name] acetaminophen (TYLENOL) 650 MG suppository Place 650 mg rectally every 4 (four) hours as needed.      [name] allopurinol (ZYLOPRIM)  300 MG tablet Take 300 mg by mouth daily.       [name] aspirin 325 MG tablet Take 325 mg by mouth daily.      . bisoprolol (ZEBETA) 10 MG tablet Take 10 mg by mouth daily.       . Calcium Carbonate-Vitamin D (CALCIUM + D PO) Take 1,200 mg by mouth daily.      . CRESTOR 20 MG tablet Take 20 mg by mouth daily.       . fexofenadine (ALLEGRA) 180 MG tablet Take 180 mg by mouth daily as needed.      [name] lisinopril-hydrochlorothiazide (PRINZIDE,ZESTORETIC) 10-12.5 MG per tablet Take 1 tablet by mouth daily.       [name] LOVAZA 1 G capsule Take 1 g by mouth 4 (four) times daily.       . Multiple Vitamin (MULTIVITAMIN) tablet Take 1 tablet by mouth daily.      Bertram Gala Glycol-Propyl Glycol (SYSTANE OP) Apply 1 drop to eye 2 (two) times daily. Each eye      . SYNTHROID 100 MCG tablet Take 100 mcg by mouth daily.       . Tamsulosin HCl (FLOMAX) 0.4 MG CAPS Take 0.4 mg by mouth daily.       [name] testosterone enanthate (DELATESTRYL) 200 MG/ML injection Inject into the muscle every 28 (twenty-eight) days. For IM use only      . Vitamin D, Ergocalciferol, (DRISDOL) 50000 UNITS CAPS Take 50,000 Units by mouth every 7 (seven) days.        No current facility-administered medications for this visit.    OBJECTIVE: Elderly white male in no acute distress Filed Vitals:   06/24/12 1105  BP: 158/68  Pulse: 65  Temp: 98.7 F (37.1 C)  Resp: 20     Body mass index is 45.33 kg/(m^2).    ECOG FS: 2  Sclerae unicteric Oropharynx clear Right pinna well-healed after recent skin procedure No peripheral adenopathy Lungs no rales or rhonchi Heart regular rate and rhythm Abd obese, nontender, positive bowel sounds MSK no focal spinal tenderness despite significant scoliosis Neuro: nonfocal, well oriented, pleasant affect   LAB RESULTS:  Results for FAIZAAN, FALLS (MRN [id number]) as of 06/25/2012 11:15  Ref. Range 07/29/2009 04:30 10/13/2009 13:49 01/30/2010 10:44 06/06/2010 13:20 12/11/2010 11:35 06/10/2011 09:52 12/11/2011  10:22 06/09/2012 09:44  M-SPIKE, % No range found 0.84 0.65 0.84 0.83 0.77 0.76 0.77 0.80     Lab Results  Component Value Date   WBC 5.8 06/09/2012   NEUTROABS 3.5 06/09/2012   HGB 15.5 06/09/2012   HCT 45.4 06/09/2012   MCV 96.2 06/09/2012   PLT 130* 06/09/2012      Chemistry      Component Value Date/Time   NA 142 06/09/2012 0944   NA 140 06/10/2011 0952   K 4.1 06/09/2012 0944   K 4.0 06/10/2011 0952   CL 105 06/09/2012 0944   CL 103 06/10/2011 0952   CO2 27 06/09/2012  0944   CO2 29 06/10/2011 0952   BUN 24.1 06/09/2012 0944   BUN 23 06/10/2011 0952   CREATININE 1.1 06/09/2012 0944   CREATININE 1.08 06/10/2011 0952      Component Value Date/Time   CALCIUM 9.3 06/09/2012 0944   CALCIUM 9.4 06/10/2011 0952   ALKPHOS 50 06/09/2012 0944   ALKPHOS 52 06/10/2011 0952   AST 20 06/09/2012 0944   AST 25 06/10/2011 0952   ALT 20 06/09/2012 0944   ALT 20 06/10/2011 0952   BILITOT 0.91 06/09/2012 0944   BILITOT 0.7 06/10/2011 0952       No results found for this basename: LABCA2    No components found with this basename: LABCA125    No results found for this basename: INR,  in the last 168 hours  Urinalysis    Component Value Date/Time   COLORURINE YELLOW 04/06/2010 1832   APPEARANCEUR CLEAR 04/06/2010 1832   LABSPEC 1.009 04/06/2010 1832   PHURINE 7.5 04/06/2010 1832   GLUCOSEU NEGATIVE 04/06/2010 1832   HGBUR NEGATIVE 04/06/2010 1832   BILIRUBINUR NEGATIVE 04/06/2010 1832   KETONESUR NEGATIVE 04/06/2010 1832   PROTEINUR NEGATIVE 04/06/2010 1832   UROBILINOGEN 0.2 04/06/2010 1832   NITRITE NEGATIVE 04/06/2010 1832   LEUKOCYTESUR NEGATIVE 04/06/2010 1832    STUDIES: No results found.   ASSESSMENT: 79 y.o. [place] man with a small M spike first noted July 2011 and stable since that time, consistent with M-GUS  PLAN: We discussed his situation in detail, and he understands that even though there is a small chance this might possibly progress to myeloma in the future, now nearly 3 years into  followup with absolutely no progression, I am very comfortable releasing him to Dr. Fredirick Maudlin care. I would suggest obtaining an SPEP once a year, but otherwise no specific followup is needed for this benign problem. The patient knows that we'll be glad to see him at any point in the future if the need were to arise, but as of now no further appointments are being made for him here.  Susana Duell C    06/24/2012

## 2012-07-13 ENCOUNTER — Ambulatory Visit (INDEPENDENT_AMBULATORY_CARE_PROVIDER_SITE_OTHER): Payer: 59 | Admitting: Podiatry

## 2012-07-13 DIAGNOSIS — M2040 Other hammer toe(s) (acquired), unspecified foot: Secondary | ICD-10-CM

## 2012-07-13 DIAGNOSIS — B351 Tinea unguium: Secondary | ICD-10-CM | POA: Insufficient documentation

## 2012-07-13 DIAGNOSIS — M25579 Pain in unspecified ankle and joints of unspecified foot: Secondary | ICD-10-CM

## 2012-07-13 NOTE — Progress Notes (Signed)
Subjective: 79 year old male presents requesting toe nails trimmed and wants to order a pair of shoes. He has a very wide foot and was not able to find a fitting size from retail shoe stores.   Objective: Thick dystrophic nails x 10. Pedal pulses faintly palpable. 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   Assessment: Onychomycosis x 10. Painful feet. 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  Plan: Debrided all nails. Both feet measured to order shoes.

## 2012-11-17 ENCOUNTER — Other Ambulatory Visit (HOSPITAL_COMMUNITY): Payer: Medicare Other

## 2012-11-17 ENCOUNTER — Encounter (HOSPITAL_COMMUNITY)
Admission: RE | Admit: 2012-11-17 | Discharge: 2012-11-17 | Disposition: A | Discharge status: Home / Self Care | Payer: Medicare Other | Source: Ambulatory Visit | Attending: Orthopedic Surgery | Admitting: Orthopedic Surgery

## 2012-11-17 ENCOUNTER — Encounter (HOSPITAL_COMMUNITY): Payer: Self-pay

## 2012-11-17 ENCOUNTER — Ambulatory Visit (HOSPITAL_COMMUNITY)
Admission: RE | Admit: 2012-11-17 | Discharge: 2012-11-17 | Disposition: A | Discharge status: Home / Self Care | Payer: Medicare Other | Source: Ambulatory Visit | Attending: Anesthesiology | Admitting: Anesthesiology

## 2012-11-17 DIAGNOSIS — Z01812 Encounter for preprocedural laboratory examination: Secondary | ICD-10-CM | POA: Insufficient documentation

## 2012-11-17 DIAGNOSIS — Z01818 Encounter for other preprocedural examination: Secondary | ICD-10-CM | POA: Insufficient documentation

## 2012-11-17 HISTORY — DX: Pneumonia, unspecified organism: J18.9

## 2012-11-17 HISTORY — DX: Unspecified hearing loss, unspecified ear: H91.90

## 2012-11-17 HISTORY — DX: Essential (primary) hypertension: I10

## 2012-11-17 HISTORY — DX: Gout, unspecified: M10.9

## 2012-11-17 HISTORY — DX: Adverse effect of unspecified anesthetic, initial encounter: T41.45XA

## 2012-11-17 HISTORY — DX: Calculus of kidney: N20.0

## 2012-11-17 HISTORY — DX: Sleep apnea, unspecified: G47.30

## 2012-11-17 HISTORY — DX: Other complications of anesthesia, initial encounter: T88.59XA

## 2012-11-17 LAB — BASIC METABOLIC PANEL
BUN: 24 mg/dL — ABNORMAL HIGH (ref 6–23)
CO2: 24 mEq/L (ref 19–32)
Calcium: 9.8 mg/dL (ref 8.4–10.5)
Creatinine, Ser: 0.94 mg/dL (ref 0.50–1.35)
GFR calc non Af Amer: 79 mL/min — ABNORMAL LOW (ref >90)
Glucose, Bld: 102 mg/dL — ABNORMAL HIGH (ref 70–99)

## 2012-11-17 LAB — CBC
HCT: 46.4 % (ref 39.0–52.0)
Hemoglobin: 16.3 g/dL (ref 13.0–17.0)
MCH: 33.1 pg (ref 26.0–34.0)
MCV: 94.3 fL (ref 78.0–100.0)
RBC: 4.92 MIL/uL (ref 4.22–5.81)

## 2012-11-17 LAB — SURGICAL PCR SCREEN: Staphylococcus aureus: NEGATIVE

## 2012-11-17 MED ORDER — DEXTROSE 5 % IV SOLN
3.0000 g | INTRAVENOUS | Status: AC
  Administered 2012-11-18: 3 g via INTRAVENOUS
  Filled 2012-11-17: qty 3000

## 2012-11-17 NOTE — Progress Notes (Signed)
Pt denies SOB and chest pain. Pt stated that he is under the care of Dr. Jacinto Halim ( cardiologist), he had an EKG within the last year, and that he has cardiac clearance for the pending procedure. A request was made for the EKG results, any cardiac studies, latest office notes, and cardiac clearance. Spoke with Lucius Conn from Dr. Verl Dicker office and she is faxing the requested info.. Pt stated that he was instructed to stop taking Aspirin on 11/13/12; pt advised to stop herbal medications ( fish oil), vitamins, and NSAID'S. Please review chest x ray on DOS.

## 2012-11-17 NOTE — Pre-Procedure Instructions (Signed)
[name] [name]  11/17/2012   Your procedure is scheduled on: Wednesday, November 18, 2012  Report to  at 11:30 AM.  Call this number if you have problems the morning of surgery: [phone]   Remember:   Do not eat food or drink liquids after midnight.   Take these medicines the morning of surgery with A SIP OF WATER: allopurinol (ZYLOPRIM) 300 MG tablet, bisoprolol (ZEBETA) 10 MG tablet, levothyroxine (SYNTHROID, LEVOTHROID) 100 MCG tablet, if needed: HYDROcodone-acetaminophen (NORCO/VICODIN) 5-325 MG per tablet for pain, fexofenadine (ALLEGRA) 180 MG tablet, for allergies, Stop taking Aspirin, vitamins and herbal medications. Do not take any NSAIDs ie: Ibuprofen, Advil, Naproxen or any medication containing Aspirin.   Do not wear jewelry, make-up or nail polish.  Do not wear lotions, powders, or perfumes. You may wear deodorant.  Do not shave 48 hours prior to surgery. Men may shave face and neck.  Do not bring valuables to the hospital.  [facility] is not responsible  for any belongings or valuables.               Contacts, dentures or bridgework may not be worn into surgery.  Leave suitcase in the car. After surgery it may be brought to your room.  For patients admitted to the hospital, discharge time is determined by your treatment team.               Patients discharged the day of surgery will not be allowed to drive home.  Name and phone number of your driver:   Special Instructions: Shower using CHG 2 nights before surgery and the night before surgery.  If you shower the day of surgery use CHG.  Use special wash - you have one bottle of CHG for all showers.  You should use approximately 1/3 of the bottle for each shower.   Please read over the following fact sheets that you were given: Pain Booklet, Coughing and Deep Breathing, MRSA Information and Surgical Site Infection Prevention

## 2012-11-17 NOTE — H&P (Signed)
History of Present Illness  The patient is a 79 year old male who presents today for follow up of their back. The patient is being followed for their low back and right hip. They are now 1 week(s) (4 days) out from last visit. Symptoms reported today include: pain (right hip pain, bilateral hips at times), difficulty ambulating, difficulty arising from chair, numbness (right leg), pain with lying, pain with sitting and pain with standing. The patient states that they are doing poorly. The following medication has been used for pain control: Tylenol. The patient reports their current pain level to be moderate to severe and 9 / 10. The patient presents today following MRI (Lumbar Spine on 10/22/2012 here at [facility]).     Subjective Transcription  This is a 79 year old white male with history of low back pain, right lower extremity radiculopathy who returns for review of his MRI scan performed recently. Scan showed L3-4 midline laminectomy changes seen. A large right paracentral disc extrusion with superior migration to the upper third of L3 present. Herniated disc displaces the right L4 nerve root. Symptoms are unchanged from previous visit. He continues to have ongoing low back pain, right leg pain, numbness, tingling and weakness.     Allergies CARDIZEM. 02/16/2002 Skin Rashes. Rash, blisters, and welts OxyCODONE HCl *ANALGESICS - OPIOID* TraMADol HCl *ANALGESICS - OPIOID* Hydrocodone-APAP *ANALGESICS - OPIOID*    Social History Alcohol use. never consumed alcohol Children. 1 Current work status. retired Financial planner (Currently). no Drug/Alcohol Rehab (Previously). no Exercise. Exercises rarely; does other Former smoker Illicit drug use. no Living situation. live with spouse Marital status. married Number of flights of stairs before winded. 1 Pain Contract. no Tobacco use. former smoker; smoke(d) less than 1/2 pack(s) per day    Medication  History Voltaren (1% Gel, APPLY TO LOWER EXTREMITIES, 4 GM OF GEL TO AFFECTED AREA 4 TIMES DAILY. DO NOT APPLY MORE THAN 16 GM DAILY TO ANY ONE AFFECTED JOINT., Taken starting 10/31/2008) Active. Lisinopril-Hydrochlorothiazide (10-12.5MG  Tablet, 1 Oral daily) Active. Bisoprolol Fumarate (10MG  Tablet, 1 Oral daily) Active. Crestor (20MG  Tablet, 1 Oral at bedtime) Active. Allopurinol (300MG  Tablet, 1 Oral daily) Active. Aspirin EC (81MG  Tablet DR, 1 Oral daily) Active. Calcium 600 (1500MG  Tablet, 1 Oral daily) Active. Levothyroxine Sodium ( Tablet, 1 Oral daily) Active. Lovaza (1GM Capsule, 1 Oral three times daily) Active. Multivitamins (1 Oral daily) Active. Vitamin D (Ergocalciferol) (50000UNIT Capsule, 1 Oral once a week) Active. Testosterone Aqueous (100MG /ML Suspension, 1 Intramuscular every month) Active. Systane Balance (0.6% Solution, 1 Ophthalmic two times daily) Active. Allegra Allergy (180MG  Tablet, 1 Oral as needed) Active. Tylenol (325MG  Tablet, 1 Oral two times daily) Active. Ketoconazole-Hydrocortisone (2 & 1% (Cream) Kit, 1 External two times daily) Active. Medications Reconciled.    Vitals 10/30/2012 11:21 AM Weight: 264 lb Height: 66 in Body Surface Area: 2.36 m Body Mass Index: 42.61 kg/m Temp.: 98.1 FPulse: 63 (Regular) BP: 184/78 (Sitting, Right Arm, Standard)     Objective Transcription  Pleasant elderly white male alert and oriented times three and in no acute distress. Gait is antalgic with a single point cane. No increase in respiratory effort. Head is normocephalic, atraumatic. PERRLA. EOMI. Cervical spine unremarkable. Lungs clear to auscultation bilaterally. No wheezes noted. Heart regular rate and rhythm, no murmurs noted. Abdomen round and obese. Skin warm and dry. Positive right straight leg raise. Right quad, anterior tib and gastroc weakness.     Assessment & Plan l Risks of surgery include, but are  not limited  to: Death, stroke, paralysis, nerve root damage/injury, bleeding, blood clots, loss of bowel/bladder control, sexual dysfunction, retrograde ejaculation, hardware failure, or malposition, spinal fluid leak, adjacent segment disease, non-union, need for further surgery, ongoing or worse pain, injury to bladder, bowel and abdominal contents, infection and recurrent disc herniation  l We have gone over the risks and benefits of surgery, which include infection, bleeding, nerve damage, death, stroke, paralysis, failure to heal, need for further surgery, ongoing or worse pain, loss of fixation, need for further surgery, CSF leak, loss of bowel or bladder control, ongoing or worse pain.   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  Schedule patient for L3-4 compression and microdiscectomy. Surgical procedure along with potential rehab/risks/recovery time discussed with him and his wife in great detail. All questions were encouraged and answered. The patient would like to have this set up some time in the near future. We will get preop medical and cardiac clearances.  He returns today for follow up with his MRI. Please refer to Zackery Barefoot dictation. His MRI shows recurrent spinal stenosis 2-3, 3-4 with a recurrent disc herniation quite large right side L3-4. At this point in time I think the reason for his acute decompensation and pain is the recurrent disc herniation. I have demonstrated this on the MRI to the patient and to his wife. He has expressed a desire to proceed with surgery. This would be a revision decompression 2-3, 3-4 and discectomy 3-4. The risks include infection, bleeding, nerve damage, death, stroke, paralysis, failure to heal, need for further surgery, ongoing or worse pain, loss of bowel and bladder control,  need for further surgery. All of his questions were addressed. We will plan on proceeding with surgery as soon as we have preoperative medical clearance.

## 2012-11-18 ENCOUNTER — Encounter (HOSPITAL_COMMUNITY)
Admission: RE | Disposition: A | Discharge status: Home Health | Payer: Self-pay | Source: Ambulatory Visit | Attending: Orthopedic Surgery

## 2012-11-18 ENCOUNTER — Inpatient Hospital Stay (HOSPITAL_COMMUNITY): Payer: Medicare Other

## 2012-11-18 ENCOUNTER — Encounter (HOSPITAL_COMMUNITY): Payer: Self-pay | Admitting: *Deleted

## 2012-11-18 ENCOUNTER — Encounter (HOSPITAL_COMMUNITY): Payer: Medicare Other | Admitting: *Deleted

## 2012-11-18 ENCOUNTER — Inpatient Hospital Stay (HOSPITAL_COMMUNITY): Payer: Medicare Other | Admitting: *Deleted

## 2012-11-18 ENCOUNTER — Inpatient Hospital Stay (HOSPITAL_COMMUNITY)
Admission: RE | Admit: 2012-11-18 | Discharge: 2012-11-20 | DRG: 519 | Disposition: A | Discharge status: Home Health | Payer: Medicare Other | Source: Ambulatory Visit | Attending: Orthopedic Surgery | Admitting: Orthopedic Surgery

## 2012-11-18 DIAGNOSIS — Z981 Arthrodesis status: Secondary | ICD-10-CM

## 2012-11-18 DIAGNOSIS — I10 Essential (primary) hypertension: Secondary | ICD-10-CM | POA: Diagnosis present

## 2012-11-18 DIAGNOSIS — E669 Obesity, unspecified: Secondary | ICD-10-CM | POA: Diagnosis present

## 2012-11-18 DIAGNOSIS — M5126 Other intervertebral disc displacement, lumbar region: Principal | ICD-10-CM | POA: Diagnosis present

## 2012-11-18 DIAGNOSIS — Z6841 Body Mass Index (BMI) 40.0 and over, adult: Secondary | ICD-10-CM

## 2012-11-18 DIAGNOSIS — G4730 Sleep apnea, unspecified: Secondary | ICD-10-CM | POA: Diagnosis present

## 2012-11-18 DIAGNOSIS — Z87891 Personal history of nicotine dependence: Secondary | ICD-10-CM

## 2012-11-18 DIAGNOSIS — M109 Gout, unspecified: Secondary | ICD-10-CM | POA: Diagnosis present

## 2012-11-18 HISTORY — PX: LUMBAR LAMINECTOMY/DECOMPRESSION MICRODISCECTOMY: SHX5026

## 2012-11-18 LAB — POCT I-STAT 4, (NA,K, GLUC, HGB,HCT)
Glucose, Bld: 129 mg/dL — ABNORMAL HIGH (ref 70–99)
Hemoglobin: 13.6 g/dL (ref 13.0–17.0)
Potassium: 5 mEq/L (ref 3.5–5.1)

## 2012-11-18 SURGERY — LUMBAR LAMINECTOMY/DECOMPRESSION MICRODISCECTOMY 2 LEVELS
Anesthesia: General | Site: Spine Lumbar | Wound class: Clean

## 2012-11-18 MED ORDER — LISINOPRIL-HYDROCHLOROTHIAZIDE 10-12.5 MG PO TABS: 1.0000 | ORAL_TABLET | Freq: Every morning | ORAL | Status: DC

## 2012-11-18 MED ORDER — THROMBIN 20000 UNITS EX SOLR
CUTANEOUS | Status: AC
  Filled 2012-11-18: qty 20000

## 2012-11-18 MED ORDER — CEFAZOLIN SODIUM 1-5 GM-% IV SOLN
1.0000 g | Freq: Three times a day (TID) | INTRAVENOUS | Status: AC
  Administered 2012-11-18 – 2012-11-19 (×2): 1 g via INTRAVENOUS
  Filled 2012-11-18 (×2): qty 50

## 2012-11-18 MED ORDER — ONDANSETRON HCL 4 MG/2ML IJ SOLN
4.0000 mg | INTRAMUSCULAR | Status: DC | PRN
  Administered 2012-11-19: 4 mg via INTRAVENOUS
  Filled 2012-11-18: qty 2

## 2012-11-18 MED ORDER — FENTANYL 10 MCG/ML IV SOLN
INTRAVENOUS | Status: DC
  Administered 2012-11-18: 21:00:00 via INTRAVENOUS
  Administered 2012-11-19: 10 ug via INTRAVENOUS
  Filled 2012-11-18 (×2): qty 50

## 2012-11-18 MED ORDER — ACETAMINOPHEN 10 MG/ML IV SOLN
1000.0000 mg | Freq: Four times a day (QID) | INTRAVENOUS | Status: AC
  Administered 2012-11-18 – 2012-11-19 (×3): 1000 mg via INTRAVENOUS
  Filled 2012-11-18 (×4): qty 100

## 2012-11-18 MED ORDER — THROMBIN 5000 UNITS EX SOLR
OROMUCOSAL | Status: DC | PRN
  Administered 2012-11-18: 18:00:00 via TOPICAL

## 2012-11-18 MED ORDER — MENTHOL 3 MG MT LOZG: 1.0000 | LOZENGE | OROMUCOSAL | Status: DC | PRN

## 2012-11-18 MED ORDER — ACETAMINOPHEN 10 MG/ML IV SOLN
1000.0000 mg | Freq: Four times a day (QID) | INTRAVENOUS | Status: DC
  Administered 2012-11-18: 1000 mg via INTRAVENOUS

## 2012-11-18 MED ORDER — BUPIVACAINE-EPINEPHRINE PF 0.25-1:200000 % IJ SOLN
INTRAMUSCULAR | Status: AC
  Filled 2012-11-18: qty 30

## 2012-11-18 MED ORDER — FENTANYL CITRATE 0.05 MG/ML IJ SOLN
INTRAMUSCULAR | Status: DC | PRN
  Administered 2012-11-18 (×4): 50 ug via INTRAVENOUS
  Administered 2012-11-18: 100 ug via INTRAVENOUS
  Administered 2012-11-18 (×4): 50 ug via INTRAVENOUS

## 2012-11-18 MED ORDER — SODIUM CHLORIDE 0.9 % IJ SOLN: 9.0000 mL | INTRAMUSCULAR | Status: DC | PRN

## 2012-11-18 MED ORDER — MIDAZOLAM HCL 5 MG/5ML IJ SOLN
INTRAMUSCULAR | Status: DC | PRN
  Administered 2012-11-18: 2 mg via INTRAVENOUS

## 2012-11-18 MED ORDER — LISINOPRIL 10 MG PO TABS
10.0000 mg | ORAL_TABLET | Freq: Every day | ORAL | Status: DC
  Administered 2012-11-19 – 2012-11-20 (×2): 10 mg via ORAL
  Filled 2012-11-18 (×2): qty 1

## 2012-11-18 MED ORDER — ROCURONIUM BROMIDE 100 MG/10ML IV SOLN
INTRAVENOUS | Status: DC | PRN
  Administered 2012-11-18: 10 mg via INTRAVENOUS
  Administered 2012-11-18: 50 mg via INTRAVENOUS
  Administered 2012-11-18: 10 mg via INTRAVENOUS

## 2012-11-18 MED ORDER — VANCOMYCIN HCL 500 MG IV SOLR
INTRAVENOUS | Status: DC | PRN
  Administered 2012-11-18: 500 mg

## 2012-11-18 MED ORDER — LOSARTAN POTASSIUM 50 MG PO TABS
50.0000 mg | ORAL_TABLET | Freq: Every morning | ORAL | Status: DC
  Administered 2012-11-19 – 2012-11-20 (×2): 50 mg via ORAL
  Filled 2012-11-18 (×2): qty 1

## 2012-11-18 MED ORDER — NALOXONE HCL 0.4 MG/ML IJ SOLN: 0.4000 mg | INTRAMUSCULAR | Status: DC | PRN

## 2012-11-18 MED ORDER — PROPOFOL 10 MG/ML IV BOLUS
INTRAVENOUS | Status: DC | PRN
  Administered 2012-11-18: 200 mg via INTRAVENOUS

## 2012-11-18 MED ORDER — OXYCODONE HCL 5 MG PO TABS
10.0000 mg | ORAL_TABLET | ORAL | Status: DC | PRN
  Administered 2012-11-19: 10 mg via ORAL
  Filled 2012-11-18 (×2): qty 2

## 2012-11-18 MED ORDER — HYDROCHLOROTHIAZIDE 12.5 MG PO CAPS
12.5000 mg | ORAL_CAPSULE | Freq: Every day | ORAL | Status: DC
  Administered 2012-11-19 – 2012-11-20 (×2): 12.5 mg via ORAL
  Filled 2012-11-18 (×2): qty 1

## 2012-11-18 MED ORDER — SODIUM CHLORIDE 0.9 % IJ SOLN: 3.0000 mL | INTRAMUSCULAR | Status: DC | PRN

## 2012-11-18 MED ORDER — METHOCARBAMOL 100 MG/ML IJ SOLN
500.0000 mg | Freq: Four times a day (QID) | INTRAVENOUS | Status: DC | PRN
  Filled 2012-11-18: qty 5

## 2012-11-18 MED ORDER — GLYCOPYRROLATE 0.2 MG/ML IJ SOLN
INTRAMUSCULAR | Status: DC | PRN
  Administered 2012-11-18: 0.6 mg via INTRAVENOUS

## 2012-11-18 MED ORDER — LACTATED RINGERS IV SOLN
INTRAVENOUS | Status: DC
  Administered 2012-11-18: 12:00:00 via INTRAVENOUS

## 2012-11-18 MED ORDER — LACTATED RINGERS IV SOLN
INTRAVENOUS | Status: DC | PRN
  Administered 2012-11-18 (×4): via INTRAVENOUS

## 2012-11-18 MED ORDER — ACETAMINOPHEN 10 MG/ML IV SOLN
INTRAVENOUS | Status: AC
  Filled 2012-11-18: qty 100

## 2012-11-18 MED ORDER — PHENOL 1.4 % MT LIQD: 1.0000 | OROMUCOSAL | Status: DC | PRN

## 2012-11-18 MED ORDER — HEMOSTATIC AGENTS (NO CHARGE) OPTIME
TOPICAL | Status: DC | PRN
  Administered 2012-11-18 (×2): 1 via TOPICAL

## 2012-11-18 MED ORDER — METHOCARBAMOL 500 MG PO TABS
500.0000 mg | ORAL_TABLET | Freq: Four times a day (QID) | ORAL | Status: DC | PRN
  Administered 2012-11-19: 500 mg via ORAL
  Filled 2012-11-18: qty 1

## 2012-11-18 MED ORDER — SUCCINYLCHOLINE CHLORIDE 20 MG/ML IJ SOLN
INTRAMUSCULAR | Status: DC | PRN
  Administered 2012-11-18: 120 mg via INTRAVENOUS

## 2012-11-18 MED ORDER — LEVOTHYROXINE SODIUM 100 MCG PO TABS
100.0000 ug | ORAL_TABLET | Freq: Every day | ORAL | Status: DC
  Administered 2012-11-19 – 2012-11-20 (×2): 100 ug via ORAL
  Filled 2012-11-18 (×3): qty 1

## 2012-11-18 MED ORDER — 0.9 % SODIUM CHLORIDE (POUR BTL) OPTIME
TOPICAL | Status: DC | PRN
  Administered 2012-11-18: 2000 mL

## 2012-11-18 MED ORDER — ONDANSETRON HCL 4 MG/2ML IJ SOLN
INTRAMUSCULAR | Status: DC | PRN
  Administered 2012-11-18: 4 mg via INTRAVENOUS

## 2012-11-18 MED ORDER — LACTATED RINGERS IV SOLN
INTRAVENOUS | Status: DC
  Administered 2012-11-19: 07:00:00 via INTRAVENOUS

## 2012-11-18 MED ORDER — ALLOPURINOL 300 MG PO TABS
300.0000 mg | ORAL_TABLET | Freq: Every day | ORAL | Status: DC
  Administered 2012-11-19 – 2012-11-20 (×2): 300 mg via ORAL
  Filled 2012-11-18 (×3): qty 1

## 2012-11-18 MED ORDER — VANCOMYCIN HCL 500 MG IV SOLR
INTRAVENOUS | Status: AC
  Filled 2012-11-18: qty 500

## 2012-11-18 MED ORDER — ZOLPIDEM TARTRATE 5 MG PO TABS: 5.0000 mg | ORAL_TABLET | Freq: Every evening | ORAL | Status: DC | PRN

## 2012-11-18 MED ORDER — ATORVASTATIN CALCIUM 10 MG PO TABS
10.0000 mg | ORAL_TABLET | Freq: Every day | ORAL | Status: DC
  Administered 2012-11-19: 10 mg via ORAL
  Filled 2012-11-18 (×2): qty 1

## 2012-11-18 MED ORDER — PHENYLEPHRINE HCL 10 MG/ML IJ SOLN
INTRAMUSCULAR | Status: DC | PRN
  Administered 2012-11-18: 80 ug via INTRAVENOUS

## 2012-11-18 MED ORDER — DIPHENHYDRAMINE HCL 50 MG/ML IJ SOLN: 12.5000 mg | Freq: Four times a day (QID) | INTRAMUSCULAR | Status: DC | PRN

## 2012-11-18 MED ORDER — SODIUM CHLORIDE 0.9 % IV SOLN: 250.0000 mL | INTRAVENOUS | Status: DC

## 2012-11-18 MED ORDER — EPHEDRINE SULFATE 50 MG/ML IJ SOLN
INTRAMUSCULAR | Status: DC | PRN
  Administered 2012-11-18 (×3): 5 mg via INTRAVENOUS

## 2012-11-18 MED ORDER — NEOSTIGMINE METHYLSULFATE 1 MG/ML IJ SOLN
INTRAMUSCULAR | Status: DC | PRN
  Administered 2012-11-18: 5 mg via INTRAVENOUS

## 2012-11-18 MED ORDER — DIPHENHYDRAMINE HCL 12.5 MG/5ML PO ELIX: 12.5000 mg | ORAL_SOLUTION | Freq: Four times a day (QID) | ORAL | Status: DC | PRN

## 2012-11-18 MED ORDER — LIDOCAINE HCL (CARDIAC) 20 MG/ML IV SOLN
INTRAVENOUS | Status: DC | PRN
  Administered 2012-11-18: 40 mg via INTRAVENOUS

## 2012-11-18 MED ORDER — ALBUMIN HUMAN 5 % IV SOLN
INTRAVENOUS | Status: DC | PRN
  Administered 2012-11-18 (×2): via INTRAVENOUS

## 2012-11-18 MED ORDER — BISOPROLOL FUMARATE 10 MG PO TABS
10.0000 mg | ORAL_TABLET | Freq: Every morning | ORAL | Status: DC
  Administered 2012-11-19 – 2012-11-20 (×2): 10 mg via ORAL
  Filled 2012-11-18 (×2): qty 1

## 2012-11-18 MED ORDER — THROMBIN 5000 UNITS EX SOLR
CUTANEOUS | Status: AC
  Filled 2012-11-18: qty 5000

## 2012-11-18 MED ORDER — THROMBIN 20000 UNITS EX SOLR
CUTANEOUS | Status: DC | PRN
  Administered 2012-11-18: 16:00:00 via TOPICAL

## 2012-11-18 MED ORDER — ONDANSETRON HCL 4 MG/2ML IJ SOLN: 4.0000 mg | Freq: Four times a day (QID) | INTRAMUSCULAR | Status: DC | PRN

## 2012-11-18 MED ORDER — SODIUM CHLORIDE 0.9 % IJ SOLN
3.0000 mL | Freq: Two times a day (BID) | INTRAMUSCULAR | Status: DC
  Administered 2012-11-19: 3 mL via INTRAVENOUS

## 2012-11-18 SURGICAL SUPPLY — 67 items
BANDAGE GAUZE ELAST BULKY 4 IN (GAUZE/BANDAGES/DRESSINGS) IMPLANT
BUR EGG ELITE 4.0 (BURR) IMPLANT
CLOTH BEACON ORANGE TIMEOUT ST (SAFETY) ×2 IMPLANT
CLSR STERI-STRIP ANTIMIC 1/2X4 (GAUZE/BANDAGES/DRESSINGS) ×2 IMPLANT
CORDS BIPOLAR (ELECTRODE) ×2 IMPLANT
DRAIN CHANNEL 15F RND FF W/TCR (WOUND CARE) ×2 IMPLANT
DRAPE C-ARM 42X72 X-RAY (DRAPES) IMPLANT
DRAPE POUCH INSTRU U-SHP 10X18 (DRAPES) ×2 IMPLANT
DRAPE SURG 17X11 SM STRL (DRAPES) ×2 IMPLANT
DRAPE U-SHAPE 47X51 STRL (DRAPES) ×2 IMPLANT
DRAPE X-RAY CASS 24X20 (DRAPES) ×2 IMPLANT
DRSG MEPILEX BORDER 4X4 (GAUZE/BANDAGES/DRESSINGS) IMPLANT
DRSG MEPILEX BORDER 4X8 (GAUZE/BANDAGES/DRESSINGS) ×2 IMPLANT
DURAPREP 26ML APPLICATOR (WOUND CARE) ×2 IMPLANT
ELECT BLADE 4.0 EZ CLEAN MEGAD (MISCELLANEOUS) ×2
ELECT CAUTERY BLADE 6.4 (BLADE) ×2 IMPLANT
ELECT REM PT RETURN 9FT ADLT (ELECTROSURGICAL) ×2
ELECTRODE BLDE 4.0 EZ CLN MEGD (MISCELLANEOUS) ×1 IMPLANT
ELECTRODE REM PT RTRN 9FT ADLT (ELECTROSURGICAL) ×1 IMPLANT
EVACUATOR SILICONE 100CC (DRAIN) ×2 IMPLANT
FLOSEAL (HEMOSTASIS) IMPLANT
GLOVE BIO SURGEON STRL SZ 6.5 (GLOVE) ×4 IMPLANT
GLOVE BIO SURGEON STRL SZ7.5 (GLOVE) ×2 IMPLANT
GLOVE BIOGEL PI IND STRL 6.5 (GLOVE) ×2 IMPLANT
GLOVE BIOGEL PI IND STRL 8 (GLOVE) ×1 IMPLANT
GLOVE BIOGEL PI IND STRL 8.5 (GLOVE) ×1 IMPLANT
GLOVE BIOGEL PI INDICATOR 6.5 (GLOVE) ×2
GLOVE BIOGEL PI INDICATOR 8 (GLOVE) ×1
GLOVE BIOGEL PI INDICATOR 8.5 (GLOVE) ×1
GLOVE ECLIPSE 8.5 STRL (GLOVE) ×2 IMPLANT
GLOVE ORTHO TXT STRL SZ7.5 (GLOVE) ×2 IMPLANT
GLOVE SURG SS PI 6.5 STRL IVOR (GLOVE) ×2 IMPLANT
GOWN PREVENTION PLUS XXLARGE (GOWN DISPOSABLE) ×2 IMPLANT
GOWN STRL NON-REIN LRG LVL3 (GOWN DISPOSABLE) IMPLANT
GOWN STRL REIN 2XL XLG LVL4 (GOWN DISPOSABLE) ×2 IMPLANT
KIT BASIN OR (CUSTOM PROCEDURE TRAY) ×2 IMPLANT
KIT STIMULAN RAPID CURE 5CC (Orthopedic Implant) ×2 IMPLANT
NEEDLE 22X1 1/2 (OR ONLY) (NEEDLE) ×2 IMPLANT
NEEDLE SPNL 18GX3.5 QUINCKE PK (NEEDLE) ×4 IMPLANT
NS IRRIG 1000ML POUR BTL (IV SOLUTION) ×2 IMPLANT
PACK LAMINECTOMY ORTHO (CUSTOM PROCEDURE TRAY) ×2 IMPLANT
PACK UNIVERSAL I (CUSTOM PROCEDURE TRAY) ×2 IMPLANT
PATTIES SURGICAL .5 X.5 (GAUZE/BANDAGES/DRESSINGS) ×4 IMPLANT
PATTIES SURGICAL .5 X1 (DISPOSABLE) ×4 IMPLANT
SPOGE SURGIFLO 8M (HEMOSTASIS) ×2
SPONGE LAP 4X18 X RAY DECT (DISPOSABLE) ×2 IMPLANT
SPONGE SURGIFLO 8M (HEMOSTASIS) ×2 IMPLANT
SPONGE SURGIFOAM ABS GEL 100 (HEMOSTASIS) ×2 IMPLANT
STAPLER VISISTAT 35W (STAPLE) IMPLANT
STRIP CLOSURE SKIN 1/2X4 (GAUZE/BANDAGES/DRESSINGS) IMPLANT
SURGIFLO TRUKIT (HEMOSTASIS) ×2 IMPLANT
SUT MNCRL AB 3-0 PS2 18 (SUTURE) ×4 IMPLANT
SUT MON AB 3-0 SH 27 (SUTURE) ×1
SUT MON AB 3-0 SH27 (SUTURE) ×1 IMPLANT
SUT VIC AB 1 CT1 18XCR BRD 8 (SUTURE) ×1 IMPLANT
SUT VIC AB 1 CT1 27 (SUTURE) ×2
SUT VIC AB 1 CT1 27XBRD ANBCTR (SUTURE) ×1 IMPLANT
SUT VIC AB 1 CT1 27XBRD ANTBC (SUTURE) ×1 IMPLANT
SUT VIC AB 1 CT1 8-18 (SUTURE) ×1
SUT VIC AB 2-0 CT1 18 (SUTURE) ×4 IMPLANT
SUT VICRYL 0 UR6 27IN ABS (SUTURE) ×2 IMPLANT
SYR BULB IRRIGATION 50ML (SYRINGE) ×2 IMPLANT
SYR CONTROL 10ML LL (SYRINGE) ×2 IMPLANT
TOWEL OR 17X26 10 PK STRL BLUE (TOWEL DISPOSABLE) ×4 IMPLANT
TRAY FOLEY CATH 16FRSI W/METER (SET/KITS/TRAYS/PACK) ×2 IMPLANT
WATER STERILE IRR 1000ML POUR (IV SOLUTION) IMPLANT
YANKAUER SUCT BULB TIP NO VENT (SUCTIONS) ×2 IMPLANT

## 2012-11-18 NOTE — Preoperative (Signed)
Beta Blockers   Reason not to administer Beta Blockers:Not Applicable 

## 2012-11-18 NOTE — Progress Notes (Signed)
Pressure held to A line site and pressure dressing applied no bleeding noted.

## 2012-11-18 NOTE — Anesthesia Postprocedure Evaluation (Signed)
  Anesthesia Post-op Note  Patient: [name] [name]  Procedure(s) Performed: Procedure(s): REVISION L2 - L4 DECOMPRESSION DISCECTOMY 2 LEVELS (N/A)  Patient Location: PACU  Anesthesia Type:General  Level of Consciousness: awake, oriented, sedated and patient cooperative  Airway and Oxygen Therapy: Patient Spontanous Breathing  Post-op Pain: mild  Post-op Assessment: Post-op Vital signs reviewed, Patient's Cardiovascular Status Stable, Respiratory Function Stable, Patent Airway, No signs of Nausea or vomiting and Pain level controlled  Post-op Vital Signs: stable  Complications: No apparent anesthesia complications

## 2012-11-18 NOTE — Progress Notes (Signed)
Patient brought home CPAP but does not want to use tonight due to wearing the PCA pump nasal cannula.

## 2012-11-18 NOTE — H&P (Signed)
H+P reviewed  Clinical exam unchanged

## 2012-11-18 NOTE — Anesthesia Procedure Notes (Signed)
Procedure Name: Intubation Date/Time: 11/18/2012 3:17 PM Performed by: Venita Lick Pre-anesthesia Checklist: Patient identified, Emergency Drugs available, Suction available, Patient being monitored and Timeout performed Patient Re-evaluated:Patient Re-evaluated prior to inductionOxygen Delivery Method: Circle system utilized Preoxygenation: Pre-oxygenation with 100% oxygen Intubation Type: IV induction Ventilation: Mask ventilation without difficulty and Oral airway inserted - appropriate to patient size Laryngoscope size: Elective Glidescope. Grade View: Grade I Tube type: Oral Tube size: 7.5 mm Number of attempts: 1 Airway Equipment and Method: Stylet and Video-laryngoscopy Placement Confirmation: ETT inserted through vocal cords under direct vision,  positive ETCO2 and breath sounds checked- equal and bilateral Secured at: 22 cm Tube secured with: Tape Dental Injury: Teeth and Oropharynx as per pre-operative assessment

## 2012-11-18 NOTE — Progress Notes (Signed)
A line removed per MD

## 2012-11-18 NOTE — Transfer of Care (Signed)
Immediate Anesthesia Transfer of Care Note  Patient: [name] [name]  Procedure(s) Performed: Procedure(s): REVISION L2 - L4 DECOMPRESSION DISCECTOMY 2 LEVELS (N/A)  Patient Location: PACU  Anesthesia Type:General  Level of Consciousness: awake  Airway & Oxygen Therapy: Patient Spontanous Breathing and Patient connected to face mask oxygen  Post-op Assessment: Report given to PACU RN and Post -op Vital signs reviewed and stable  Post vital signs: Reviewed and stable  Complications: No apparent anesthesia complications

## 2012-11-18 NOTE — Brief Op Note (Signed)
11/18/2012  7:26 PM  PATIENT:  [name] [name]  79 y.o. male  PRE-OPERATIVE DIAGNOSIS:  recurrent L3 - L4 disc herniation and stenosis L2 - L4  POST-OPERATIVE DIAGNOSIS:  recurrent L3 - L4 disc herniation and stenosis L2 - L4  PROCEDURE:  Procedure(s): REVISION L2 - L4 DECOMPRESSION DISCECTOMY 2 LEVELS (N/A)  SURGEON:  Surgeon(s) and Role:    * Venita Lick, MD - Primary  PHYSICIAN ASSISTANT:   ASSISTANTS: Zonia Kief   ANESTHESIA:   general  EBL:  Total I/O In: 500 [IV Piggyback:500] Out: 50 [Urine:50]  BLOOD ADMINISTERED:none  DRAINS: Penrose drain in the back   LOCAL MEDICATIONS USED:  NONE  SPECIMEN:  No Specimen  DISPOSITION OF SPECIMEN:  N/A  COUNTS:  YES  TOURNIQUET:  * No tourniquets in log *  DICTATION: .Other Dictation: Dictation Number [id number]  PLAN OF CARE: Admit to inpatient   PATIENT DISPOSITION:  PACU - hemodynamically stable.

## 2012-11-18 NOTE — Anesthesia Preprocedure Evaluation (Addendum)
Anesthesia Evaluation  Patient identified by MRN, date of birth, ID band Patient awake  General Assessment Comment:HO difficult airway (failed intubation 2005) with subsequent successful intubations with Glidescope (2009 and 2012)  Reviewed: Allergy & Precautions, H&P , NPO status , Patient's Chart, lab work & pertinent test results, reviewed documented beta blocker date and time   History of Anesthesia Complications (+) DIFFICULT AIRWAY and history of anesthetic complications  Airway Mallampati: II      Dental  (+) Teeth Intact and Dental Advisory Given   Pulmonary sleep apnea and Continuous Positive Airway Pressure Ventilation , pneumonia -, former smoker,  breath sounds clear to auscultation        Cardiovascular hypertension, Pt. on medications + CAD and + CABG Rhythm:Regular Rate:Normal     Neuro/Psych    GI/Hepatic negative GI ROS, Neg liver ROS,   Endo/Other  Hypothyroidism   Renal/GU      Musculoskeletal   Abdominal   Peds  Hematology   Anesthesia Other Findings   Reproductive/Obstetrics                          Anesthesia Physical Anesthesia Plan  ASA: III  Anesthesia Plan: General   Post-op Pain Management:    Induction: Intravenous  Airway Management Planned: Oral ETT and Video Laryngoscope Planned  Additional Equipment:   Intra-op Plan:   Post-operative Plan: Possible Post-op intubation/ventilation  Informed Consent: I have reviewed the patients History and Physical, chart, labs and discussed the procedure including the risks, benefits and alternatives for the proposed anesthesia with the patient or authorized representative who has indicated his/her understanding and acceptance.   Dental advisory given  Plan Discussed with: CRNA, Anesthesiologist and Surgeon  Anesthesia Plan Comments:        Anesthesia Quick Evaluation

## 2012-11-19 ENCOUNTER — Encounter (HOSPITAL_COMMUNITY): Payer: Self-pay | Admitting: Orthopedic Surgery

## 2012-11-19 LAB — CBC
HCT: 37.3 % — ABNORMAL LOW (ref 39.0–52.0)
Hemoglobin: 12.8 g/dL — ABNORMAL LOW (ref 13.0–17.0)
MCH: 32.7 pg (ref 26.0–34.0)
MCHC: 34.3 g/dL (ref 30.0–36.0)
MCV: 95.2 fL (ref 78.0–100.0)
RDW: 14.5 % (ref 11.5–15.5)

## 2012-11-19 LAB — BASIC METABOLIC PANEL
BUN: 23 mg/dL (ref 6–23)
CO2: 28 mEq/L (ref 19–32)
Calcium: 9 mg/dL (ref 8.4–10.5)
Creatinine, Ser: 1.02 mg/dL (ref 0.50–1.35)
GFR calc Af Amer: 80 mL/min — ABNORMAL LOW (ref >90)
GFR calc non Af Amer: 69 mL/min — ABNORMAL LOW (ref >90)
Glucose, Bld: 136 mg/dL — ABNORMAL HIGH (ref 70–99)
Potassium: 4.7 mEq/L (ref 3.5–5.1)
Sodium: 139 mEq/L (ref 135–145)

## 2012-11-19 MED ORDER — PNEUMOCOCCAL VAC POLYVALENT 25 MCG/0.5ML IJ INJ
0.5000 mL | INJECTION | INTRAMUSCULAR | Status: AC
  Administered 2012-11-20: 0.5 mL via INTRAMUSCULAR
  Filled 2012-11-19: qty 0.5

## 2012-11-19 NOTE — Op Note (Signed)
NAME:  [name] [name], [name] [name] NO.:  [id number]  MEDICAL RECORD NO.:  [id number]  LOCATION:  [id number]                        FACILITY:  [facility]  PHYSICIAN:  Alvy Beal, MD    DATE OF BIRTH:  04/10/1932  DATE OF PROCEDURE:  11/18/2012 DATE OF DISCHARGE:                              OPERATIVE REPORT   PREOPERATIVE DIAGNOSIS:  Recurrent disk herniation L3-4 with spinal stenosis L2-3, L4-5.  POSTOPERATIVE DIAGNOSIS:  Recurrent disk herniation L3-4 with spinal stenosis L2-3, L4-5.  OPERATIVE PROCEDURE:  Revision and decompression diskectomy L2-L4.  COMPLICATIONS:  None.  CONDITION:  Stable.  INTRAOPERATIVE FINDINGS:  Large recurrent L3-4 disk herniation at L3-4 with superior migration behind the body of L3 removed without difficulty.  Complete L3-4 and L4-5 decompression with complete laminectomy L4 and L3 and partial laminotomy of L2.  OPERATIVE HISTORY:  This is a very pleasant 79 year old gentleman, who had two previous back operations.  He now has a spinal stenosis at L2-3, recurrent disk herniation at L3-4 and spinal stenosis, mild at L4-5. This is all on the right-hand side.  After attempts at conservative management had failed to alleviate his symptoms, we elected to take him to the operating room for the aforementioned procedure.  All appropriate risks, benefits, and alternatives were discussed with the patient and consent was obtained.  OPERATIVE NOTE:  The patient was brought to the operating room, placed supine on the operating table.  After successful induction of general anesthesia and endotracheal intubation, TEDs, SCDs, and Foley were inserted.  The patient was turned prone onto the Wilson frame.  All bony prominences were well padded.  The back was prepped and draped in standard fashion.  A time-out was taken to confirm the patient, procedure, and all other pertinent important data.  Once this was completed, a midline incision was made utilizing the  previous incision. Sharp dissection was carried out down to the deep fascia.  The patient's L2 and L4 spinous processes were noted on preop imaging to be intact and identified the L4 spinous process, and continued my dissection until I saw the tip of the spinous process.  I then stripped the paraspinal muscles on the right-hand side to expose the L4 lamina and the facet complex.  I then went and did the same thing at L2.  At this point, with the spinous processes exposed.  I could now I could now more aggressively debride the very thickened scar tissue that had formed at previous L3-4 laminotomy diskectomy site.  I then debulked majority of this until I saw the remnant of bone from the facet joint and the remaining portion of the lamina.  At this point, I went down to the L4 lamina and inferior edge and used a 3-mm Kerrison to remove the majority of the lamina.  I then proceeded superiorly taking down the spinous process and undercutting it to perform a central decompression.  I then went into the lateral gutter and decompress the lateral recess.  I then identified the pars of the medial border of the facet complex and pars of L3 and I resected this in its entirety with a 2 and 3-mm Kerrison.  I then completed  the L4 laminectomy.  This allowed me to completely visualize the L4 pedicle and the L4 nerve root as it traversed the medial border of the pedicle.  I then began working my way superiorly, completing.  I removed the entire inferior L3 facet complex.  This allowed me to have an excellent visualization of the L3 nerve root. There was significant scar tissue from his previous diskectomy and having a wider bony decompression allowed me to adequately visualized without putting undue tension on the thecal sac or nerve root.  There was significant adherent scar tissue and I debrided around this.  I debrided as much as I could safely without endangering the thecal sac and causing a  traumatic durotomy.  Once I had the L3 nerve root identified in the foramen I traced it back to its origin.  I then identified the L3-4 disk, placed a Penfield 4 into it and took an x-ray to confirm that this was indeed the appropriate level.  Once I had done this, I then identified the large disk fragment and using an nerve hook and pituitary rongeur I excised multiple large fragmented disk material from behind the vertebral body of L3 and at disk level itself.  This did correspond with the MRI images.  I then identified the L2 lamina and removed the scar tissue with curette and then used a 2 and 3-mm Kerrison to perform a laminotomy of L2.  I then released the ligamentum flavum and exposed the underlying thecal sac.  I then worked my way back towards L3 and completing my L3 laminectomy and undercutting the spinous process.  Once I had the central and lateral recess decompression complete, I then continued into the lateral recess.  I could now visualize the L3 pedicle and now I could see the entire L3 nerve root from origin into the lateral recess and out the foramen.  I then swept the nerve root inferiorly and checked the lateral recess to make sure there was no further disk fragments that had migrated superior to the pedicle.  Once I confirmed this, I continued my lateral recess decompression of the L2-3 facet complex, which based on the MRI showed stenosis.  At this point, the complete right lateral side from the superior aspect of L5 to the inferior aspect of L2 was decompressed. The L3 nerve root was completely free under no due to tension.  The diskectomy was completed.  There was no further fragments of disk material either at the disk level or behind the vertebral body of L3 and the L4 nerve root was completely decompressed.  I irrigated the wound copiously with normal saline and then used bipolar electrocautery and bone wax to obtain hemostasis.  I then placed a deep drain, and  then placed thrombin-soaked Gelfoam patty over the exposed thecal sac.  I then placed vancomycin impregnated beads into the wound to further decrease the risk of infection.  I placed a deep drain and then closed the deep fascia with interrupted #1 Vicryl sutures, superficial 2-0 Vicryl sutures, and 3-0 Monocryl for the skin.  Steri-Strips and dry dressing were applied.  The patient was transferred to the PACU without incident.  At the end of the case, all needle and sponge counts were correct, and there was no adverse intraoperative events.  FIRST ASSISTANT:  Genene Churn. Denton Meek.     Alvy Beal, MD     DDB/MEDQ  D:  11/18/2012  T:  11/19/2012  Job:  [id number]

## 2012-11-19 NOTE — Evaluation (Signed)
Physical Therapy Evaluation Patient Details Name: [name] [name] MRN: [id number] DOB: 11-13-1932 Today's Date: 11/19/2012 Time: 7829-5621 PT Time Calculation (min): 27 min  PT Assessment / Plan / Recommendation History of Present Illness  Revision and decompression diskectomy L2-L4  Clinical Impression  Pt presents with acute pain and decreased independence with functional activity following the above mentioned procedure. Bed mobility not evaluated as pt was sitting in recliner when session began, however required only min guard for transfers and close guard for ambulation with RW. Frequent cueing to maintain back precautions necessary.    PT Assessment  Patient needs continued PT services    Follow Up Recommendations  Home health PT    Does the patient have the potential to tolerate intense rehabilitation      Barriers to Discharge        Equipment Recommendations  Rolling walker with 5" wheels    Recommendations for Other Services     Frequency Min 5X/week    Precautions / Restrictions Precautions Precautions: Back Required Braces or Orthoses: Spinal Brace Spinal Brace: Lumbar corset;Applied in sitting position Restrictions Weight Bearing Restrictions: No   Pertinent Vitals/Pain 5/10 pain during ambulation.      Mobility  Bed Mobility Bed Mobility: Not assessed Rolling Right: 4: Min guard Right Sidelying to Sit: 4: Min guard;HOB elevated (about 10 degrees, his is elevated this much at home) Sitting - Scoot to Edge of Bed: 4: Min guard Details for Bed Mobility Assistance: Pt received sitting in recliner Transfers Transfers: Sit to Stand;Stand to Sit Sit to Stand: 4: Min guard;From chair/3-in-1;With upper extremity assist Stand to Sit: 4: Min guard;To chair/3-in-1;With upper extremity assist;To toilet Details for Transfer Assistance: VC's for hand placement and to maintain back precautions.  Ambulation/Gait Ambulation/Gait Assistance: 5: Supervision Ambulation  Distance (Feet): 250 Feet Assistive device: Rolling walker Ambulation/Gait Assistance Details: VC's for improved posture and walker position to maintain back precautions. Gait Pattern: Within Functional Limits Gait velocity: slightly decreased Stairs: No    Exercises     PT Diagnosis: Difficulty walking;Acute pain  PT Problem List: Decreased strength;Decreased activity tolerance;Decreased range of motion;Decreased balance;Decreased mobility;Decreased knowledge of use of DME;Decreased safety awareness;Pain PT Treatment Interventions: DME instruction;Gait training;Stair training;Functional mobility training;Therapeutic activities;Therapeutic exercise;Neuromuscular re-education;Patient/family education     PT Goals(Current goals can be found in the care plan section) Acute Rehab PT Goals Patient Stated Goal: To return home with wife PT Goal Formulation: With patient/family Time For Goal Achievement: 11/26/12 Potential to Achieve Goals: Good  Visit Information  Last PT Received On: 11/19/12 Assistance Needed: +1 History of Present Illness: Revision and decompression diskectomy L2-L4       Prior Functioning  Home Living Family/patient expects to be discharged to:: Private residence Living Arrangements: Spouse/significant other Available Help at Discharge: Family;Available 24 hours/day Type of Home: House Home Access: Stairs to enter [employer] of Steps: 3 Entrance Stairs-Rails: None Home Layout: Two level;Bed/bath upstairs;Full bath on main level Alternate Level Stairs-Number of Steps: 12 Alternate Level Stairs-Rails: Left (going up) Home Equipment: Cane - single point;Walker - standard;Grab bars - tub/shower;Hand held shower head;Shower seat - built in Additional Comments: Can live on first floor if needed. Full bath and bed; walker has tennis balls on back and gliders on the front (pt reports that it moves really smoothly) Prior Function Level of Independence:  Independent with assistive device(s) Communication Communication: No difficulties    Cognition  Cognition Arousal/Alertness: Awake/alert Behavior During Therapy: WFL for tasks assessed/performed Overall Cognitive Status: Within Functional Limits  for tasks assessed    Extremity/Trunk Assessment Upper Extremity Assessment Upper Extremity Assessment: Overall WFL for tasks assessed Lower Extremity Assessment Lower Extremity Assessment: Overall WFL for tasks assessed Cervical / Trunk Assessment Cervical / Trunk Assessment: Normal   Balance Balance Balance Assessed: Yes Static Sitting Balance Static Sitting - Balance Support: Feet supported;Bilateral upper extremity supported Static Sitting - Level of Assistance: 5: Stand by assistance Static Sitting - Comment/# of Minutes: 5 Static Standing Balance Static Standing - Balance Support: Bilateral upper extremity supported Static Standing - Comment/# of Minutes: 1 minute - stand by assist with walker  End of Session PT - End of Session Equipment Utilized During Treatment: Gait belt Activity Tolerance: Patient tolerated treatment well Patient left: in chair;with call bell/phone within reach;with family/visitor present Nurse Communication: Mobility status  GP     [name] [name] 11/19/2012, 1:26 PM  [name] [name], PT, DPT Acute Rehabilitation Services [phone]

## 2012-11-19 NOTE — Progress Notes (Signed)
Fentanyl PCA syringe discontinued at 0630 this AM. Wasted 41 mL/410 mcg in sink. Witnessed by Sharlynn Oliphant, RN.

## 2012-11-19 NOTE — Progress Notes (Signed)
11/19/12 Spoke with patient and his wife about HHC. They selected [facility]. Contacted [facility] with [facility], they will not be bale to service the patient due to caseload. Patient chose [facility], contacted [facility] with [facility] and set up HHPT. Patient states that he has a rolling walker and an elavated toilet seat at home. No equipment needs identified. Will continue to follow for d/c needs. Jacquelynn Cree RN, BSN, CCM

## 2012-11-19 NOTE — Evaluation (Signed)
Occupational Therapy Evaluation Patient Details Name: [name] [name] MRN: [id number] DOB: Feb 24, 1932 Today's Date: 11/19/2012 Time: 1018-1050 OT Time Calculation (min): 32 min  OT Assessment / Plan / Recommendation History of present illness Revision and decompression diskectomy L2-L4   Clinical Impression   This 79 yo male s/p above presents to acute OT with deficits below. Will benefit from acute OT without need for follow up.    OT Assessment  Patient needs continued OT Services    Follow Up Recommendations  No OT follow up       Equipment Recommendations  None recommended by OT       Frequency  Min 2X/week    Precautions / Restrictions Precautions Precautions: Back--pt able to state 2/3 back precautions when first asked (had to remind him of no arching) Required Braces or Orthoses: Spinal Brace Spinal Brace: Lumbar corset;Applied in sitting position Restrictions Weight Bearing Restrictions: No       ADL  Eating/Feeding: Independent Where Assessed - Eating/Feeding: Chair Grooming: Supervision/safety Where Assessed - Grooming: Unsupported standing Upper Body Bathing: Set up Where Assessed - Upper Body Bathing: Unsupported sitting Lower Body Bathing: Maximal assistance Where Assessed - Lower Body Bathing: Unsupported sit to stand Upper Body Dressing: Minimal assistance Where Assessed - Upper Body Dressing: Unsupported sitting Lower Body Dressing: +1 Total assistance Where Assessed - Lower Body Dressing: Unsupported sit to stand Toilet Transfer: Min Pension scheme manager Method: Sit to Barista: Comfort height toilet;Grab bars Toileting - Architect and Hygiene: Supervision/safety Where Assessed - Engineer, mining and Hygiene: Sit to stand from 3-in-1 or toilet Equipment Used: Rolling walker;Back brace Transfers/Ambulation Related to ADLs: min guard A for all ADL Comments: Pt max A to don brace    OT Diagnosis:  Generalized weakness  OT Problem List: Decreased knowledge of use of DME or AE;Decreased knowledge of precautions;Impaired balance (sitting and/or standing);Obesity OT Treatment Interventions: Self-care/ADL training;Balance training;DME and/or AE instruction;Patient/family education   OT Goals(Current goals can be found in the care plan section) Acute Rehab OT Goals Patient Stated Goal: To be able to do my LBB/D by myself Time For Goal Achievement: 11/26/12 Potential to Achieve Goals: Good  Visit Information  Last OT Received On: 11/19/12 Assistance Needed: +1 History of Present Illness: Revision and decompression diskectomy L2-L4       Prior Functioning     Home Living Family/patient expects to be discharged to:: Private residence Living Arrangements: Spouse/significant other Available Help at Discharge: Family;Available 24 hours/day Type of Home: House Home Access: Stairs to enter [employer] of Steps: 3 Entrance Stairs-Rails: None Home Layout: Two level;Bed/bath upstairs Alternate Level Stairs-Number of Steps: 12 Alternate Level Stairs-Rails: Left (going up) Home Equipment: Cane - single point;Walker - standard;Grab bars - tub/shower;Hand held shower head;Shower seat - built in Additional Comments: Can live on first floor if needed. Full bath and bed; walker has tennis balls on back and gliders on the front (pt reports that it moves really smoothly) Prior Function Level of Independence: Independent with assistive device(s) (Part of the time did not have to use cane)         Vision/Perception Vision - History Patient Visual Report: No change from baseline   Cognition  Cognition Arousal/Alertness: Awake/alert Behavior During Therapy: WFL for tasks assessed/performed Overall Cognitive Status: Within Functional Limits for tasks assessed    Extremity/Trunk Assessment Upper Extremity Assessment Upper Extremity Assessment: Overall WFL for tasks assessed      Mobility Bed Mobility Bed Mobility: Rolling Right;Right  Sidelying to Sit;Sitting - Scoot to [employer] of Bed Rolling Right: 4: Min guard Right Sidelying to Sit: 4: Min guard;HOB elevated (about 10 degrees, his is elevated this much at home) Sitting - Scoot to Edge of Bed: 4: Min guard Transfers Transfers: Sit to Stand;Stand to Sit Sit to Stand: 4: Min guard;With upper extremity assist;From bed Stand to Sit: 4: Min guard;With upper extremity assist;With armrests;To chair/3-in-1 Details for Transfer Assistance: VCs for safe hand placement           End of Session OT - End of Session Equipment Utilized During Treatment: Rolling walker;Back brace Activity Tolerance: Patient tolerated treatment well Patient left: in chair;with call bell/phone within reach;with family/visitor present       Evette Georges [phone] 11/19/2012, 11:40 AM

## 2012-11-19 NOTE — Progress Notes (Signed)
Utilization Review Completed.Makenah Karas T10/23/2014  

## 2012-11-19 NOTE — Progress Notes (Signed)
    Subjective: Procedure(s) (LRB): REVISION L2 - L4 DECOMPRESSION DISCECTOMY 2 LEVELS (N/A) 1 Day Post-Op  Patient reports pain as 3 on 0-10 scale.  Reports decreased leg pain reports incisional back pain   Positive void Negative bowel movement Positive flatus Negative chest pain or shortness of breath  Objective: Vital signs in last 24 hours: Temp:  [97.1 F (36.2 C)-97.7 F (36.5 C)] 97.4 F (36.3 C) (10/23 0428) Pulse Rate:  [54-67] 59 (10/23 0428) Resp:  [8-20] 20 (10/23 0428) BP: (119-158)/(58-76) 152/60 mmHg (10/23 0428) SpO2:  [94 %-100 %] 97 % (10/23 0428) Arterial Line BP: (142-160)/(61-130) 160/61 mmHg (10/22 2100) Weight:  [120.657 kg (266 lb)] 120.657 kg (266 lb) (10/23 0500)  Intake/Output from previous day: 10/22 0701 - 10/23 0700 In: 5228 [P.O.:240; I.V.:4400; IV Piggyback:500] Out: 2185 [Urine:1040; Drains:145; Blood:1000]  Labs:  Recent Labs  11/17/12 1423 11/18/12 1907 11/19/12 0500  WBC 7.1  --  9.9  RBC 4.92  --  3.92*  HCT 46.4 40.0 37.3*  PLT 154  --  135*    Recent Labs  11/17/12 1423 11/18/12 1907 11/19/12 0500  NA 137 138 139  K 4.1 5.0 4.7  CL 102  --  102  CO2 24  --  28  BUN 24*  --  23  CREATININE 0.94  --  1.02  GLUCOSE 102* 129* 136*  CALCIUM 9.8  --  9.0   No results found for this basename: LABPT, INR,  in the last 72 hours  Physical Exam: Neurologically intact ABD soft Neurovascular intact Intact pulses distally Incision: dressing C/D/I and no drainage Compartment soft  Assessment/Plan: Patient stable  xrays n/a Continue mobilization with physical therapy Continue care  Advance diet Up with therapy D/C IV fluids Mobilization Monitor HCT Possible d/c Friday or Saturday   Venita Lick, MD [facility] [phone]

## 2012-11-20 ENCOUNTER — Encounter (HOSPITAL_COMMUNITY): Payer: Self-pay

## 2012-11-20 MED ORDER — HYDROCODONE-ACETAMINOPHEN 5-325 MG PO TABS: 1.0000 | ORAL_TABLET | ORAL | Status: DC | PRN

## 2012-11-20 MED ORDER — METHOCARBAMOL 500 MG PO TABS: 500.0000 mg | ORAL_TABLET | Freq: Three times a day (TID) | ORAL | Status: DC | PRN

## 2012-11-20 MED ORDER — POLYETHYLENE GLYCOL 3350 17 GM/SCOOP PO POWD: 17.0000 g | Freq: Every day | ORAL | Status: DC

## 2012-11-20 MED ORDER — ONDANSETRON HCL 4 MG PO TABS: 4.0000 mg | ORAL_TABLET | Freq: Three times a day (TID) | ORAL | Status: DC | PRN

## 2012-11-20 MED ORDER — ACETAMINOPHEN 325 MG PO TABS: 650.0000 mg | ORAL_TABLET | Freq: Four times a day (QID) | ORAL | Status: DC | PRN

## 2012-11-20 MED ORDER — HYDROCODONE-ACETAMINOPHEN 5-325 MG PO TABS: 1.0000 | ORAL_TABLET | Freq: Four times a day (QID) | ORAL | Status: DC | PRN

## 2012-11-20 NOTE — Discharge Summary (Signed)
Patient ID: [name] [name] MRN: [id number] DOB/AGE: 06-Dec-1932 79 y.o.  Admit date: 11/18/2012 Discharge date: 11/20/2012  Admission Diagnoses:  Active Problems:   * No active hospital problems. *   Discharge Diagnoses:  Active Problems:   * No active hospital problems. *  status post Procedure(s): REVISION L2 - L4 DECOMPRESSION DISCECTOMY 2 LEVELS  Past Medical History  Diagnosis Date  . Complication of anesthesia     Pt difficult to intubate; 07/19/03 aborted surgery unable to intubate  . Pneumonia   . Kidney stones     Hx: of  . Sleep apnea     Hx: of pt wears CPAP  . Gout     Hx: of in left wrist  . Hearing deficit     Hx: of wears hearing aids  . Hypertension     Surgeries: Procedure(s): REVISION L2 - L4 DECOMPRESSION DISCECTOMY 2 LEVELS on 11/18/2012   Consultants:    Discharged Condition: Improved  Hospital Course: [name] [name] is an 79 y.o. male who was admitted 11/18/2012 for operative treatment of <principal problem not specified>. Patient failed conservative treatments (please see the history and physical for the specifics) and had severe unremitting pain that affects sleep, daily activities and work/hobbies. After pre-op clearance, the patient was taken to the operating room on 11/18/2012 and underwent  Procedure(s): REVISION L2 - L4 DECOMPRESSION DISCECTOMY 2 LEVELS.    Patient was given perioperative antibiotics: Anti-infectives   Start     Dose/Rate Route Frequency Ordered Stop   11/18/12 2215  ceFAZolin (ANCEF) IVPB 1 g/50 mL premix     1 g 100 mL/hr over 30 Minutes Intravenous Every 8 hours 11/18/12 2200 11/19/12 0644   11/18/12 1757  vancomycin (VANCOCIN) powder  Status:  Discontinued       As needed 11/18/12 1759 11/18/12 2001   11/17/12 1438  ceFAZolin (ANCEF) 3 g in dextrose 5 % 50 mL IVPB     3 g 160 mL/hr over 30 Minutes Intravenous 30 min pre-op 11/17/12 1438 11/18/12 1500       Patient was given sequential compression devices  and early ambulation to prevent DVT.   Patient benefited maximally from hospital stay and there were no complications. At the time of discharge, the patient was urinating/moving their bowels without difficulty, tolerating a regular diet, pain is controlled with oral pain medications and they have been cleared by PT/OT.   Recent vital signs: Patient Vitals for the past 24 hrs:  BP Temp Pulse Resp SpO2  11/20/12 0543 101/57 mmHg 98.4 F (36.9 C) 79 18 97 %  11/19/12 2054 134/56 mmHg 97.3 F (36.3 C) 74 18 94 %  11/19/12 1439 103/45 mmHg 98.1 F (36.7 C) 62 20 -  11/19/12 1004 126/45 mmHg - 71 - -     Recent laboratory studies:  Recent Labs  11/17/12 1423 11/18/12 1907 11/19/12 0500  WBC 7.1  --  9.9  HGB 16.3 13.6 12.8*  HCT 46.4 40.0 37.3*  PLT 154  --  135*  NA 137 138 139  K 4.1 5.0 4.7  CL 102  --  102  CO2 24  --  28  BUN 24*  --  23  CREATININE 0.94  --  1.02  GLUCOSE 102* 129* 136*  CALCIUM 9.8  --  9.0     Discharge Medications:     Medication List    STOP taking these medications       ketoconazole 2 % cream  Commonly known as:  NIZORAL      TAKE these medications       allopurinol 300 MG tablet  Commonly known as:  ZYLOPRIM  Take 300 mg by mouth daily.     aspirin EC 81 MG tablet  Take 81 mg by mouth daily.     bisoprolol 10 MG tablet  Commonly known as:  ZEBETA  Take 10 mg by mouth every morning.     CALCIUM + D PO  Take 1,200 mg by mouth daily.     fexofenadine 180 MG tablet  Commonly known as:  ALLEGRA  Take 180 mg by mouth daily as needed (for allergies).     HYDROcodone-acetaminophen 5-325 MG per tablet  Commonly known as:  NORCO  Take 1 tablet by mouth every 4 (four) hours as needed for pain.     levothyroxine 100 MCG tablet  Commonly known as:  SYNTHROID, LEVOTHROID  Take 100 mcg by mouth daily before breakfast.     lisinopril-hydrochlorothiazide 10-12.5 MG per tablet  Commonly known as:  PRINZIDE,ZESTORETIC  Take 1 tablet by  mouth every morning.     losartan 50 MG tablet  Commonly known as:  COZAAR  Take 50 mg by mouth every morning.     LOVAZA 1 G capsule  Generic drug:  omega-3 acid ethyl esters  Take 1 g by mouth 3 (three) times daily.     methocarbamol 500 MG tablet  Commonly known as:  ROBAXIN  Take 1 tablet (500 mg total) by mouth 3 (three) times daily as needed.     multivitamin tablet  Take 1 tablet by mouth daily.     ondansetron 4 MG tablet  Commonly known as:  ZOFRAN  Take 1 tablet (4 mg total) by mouth every 8 (eight) hours as needed for nausea.     polyethylene glycol powder powder  Commonly known as:  GLYCOLAX  Take 17 g by mouth daily.     rosuvastatin 20 MG tablet  Commonly known as:  CRESTOR  Take 20 mg by mouth at bedtime.     SYSTANE OP  Apply 1 drop to eye 2 (two) times daily. Each eye     testosterone enanthate 200 MG/ML injection  Commonly known as:  DELATESTRYL  Inject 200 mg into the muscle See admin instructions. For IM use only, injects 200mg  every 6 weeks     Vitamin D (Ergocalciferol) 50000 UNITS Caps capsule  Commonly known as:  DRISDOL  Take 50,000 Units by mouth every 7 (seven) days.        Diagnostic Studies: Dg Chest 2 View  11/17/2012   CLINICAL DATA:  Preop for lumbar surgery.  EXAM: CHEST  2 VIEW  COMPARISON:  Multiple priors  FINDINGS: Cardiac silhouette remains enlarged. No change in mediastinal contours. Mild bibasilar opacities. No edema. No pleural effusion or pneumothorax. No acute osseous abnormality.  IMPRESSION: 1.  Stable enlargement of the cardiac silhouette.  2. Mild bibasilar opacities, likely atelectasis.   Electronically Signed   By: Jerene Dilling M.D.   On: 11/17/2012 14:56   Dg Lumbar Spine 2-3 Views  11/18/2012   CLINICAL DATA:  L2 through L4 decompression  EXAM: LUMBAR SPINE - 2-3 VIEW  COMPARISON:  Lumbar MRI 01/31/2012  FINDINGS: Image number 1 reveals a surgical instrument overlying the spinal canal at the mid L5 level. There  is a chronic compression fracture of L5.  Image number 2 reveals an instrument overlying the posterior disk space at L3-4.  IMPRESSION: Lumbar localization as above.   Electronically Signed   By: Marlan Palau M.D.   On: 11/18/2012 18:32   Dg Spine Portable 1 View  11/18/2012   CLINICAL DATA:  79 year old male undergoing lumbar surgery.  EXAM: PORTABLE SPINE - 1 VIEW  COMPARISON:  Lumbar MRI 01/30/2010.  FINDINGS: Intraoperative portable cross-table lateral view of the lumbar spine at 1605 hr labeled film #2.  Same numbering system as on 01/30/2010. Chronic T12 vertebra plana. Chronic L5 compression fracture.  Surgical probe directed at the L4 pedicle level.  IMPRESSION: Intraoperative localization at L4 on film #2. At 1605 hr.   Electronically Signed   By: Augusto Gamble M.D.   On: 11/18/2012 16:18          Follow-up Information   Follow up with [facility].   Contact information:   [address] [place] [address] [phone]       Follow up with Alvy Beal, MD. Schedule an appointment as soon as possible for a visit in 2 weeks.   Specialty:  Orthopedic Surgery   Contact information:   [address] [place] [address] [phone]       Discharge Plan:  discharge to home with HHS  Disposition: stable    Signed: Venita Lick D for Dr. Venita Lick [facility] [phone] 11/20/2012, 7:58 AM

## 2012-11-20 NOTE — Progress Notes (Signed)
    Subjective: Procedure(s) (LRB): REVISION L2 - L4 DECOMPRESSION DISCECTOMY 2 LEVELS (N/A) 2 Days Post-Op  Patient reports pain as 1 on 0-10 scale.  Reports decreased leg pain reports incisional back pain   Positive void Negative bowel movement Positive flatus Negative chest pain or shortness of breath  Objective: Vital signs in last 24 hours: Temp:  [97.3 F (36.3 C)-98.4 F (36.9 C)] 98.4 F (36.9 C) (10/24 0543) Pulse Rate:  [62-79] 79 (10/24 0543) Resp:  [18-20] 18 (10/24 0543) BP: (101-134)/(45-57) 101/57 mmHg (10/24 0543) SpO2:  [94 %-97 %] 97 % (10/24 0543)  Intake/Output from previous day: 10/23 0701 - 10/24 0700 In: 480 [P.O.:480] Out: 270 [Drains:170; Stool:100]  Labs:  Recent Labs  11/17/12 1423 11/18/12 1907 11/19/12 0500  WBC 7.1  --  9.9  RBC 4.92  --  3.92*  HCT 46.4 40.0 37.3*  PLT 154  --  135*    Recent Labs  11/17/12 1423 11/18/12 1907 11/19/12 0500  NA 137 138 139  K 4.1 5.0 4.7  CL 102  --  102  CO2 24  --  28  BUN 24*  --  23  CREATININE 0.94  --  1.02  GLUCOSE 102* 129* 136*  CALCIUM 9.8  --  9.0   No results found for this basename: LABPT, INR,  in the last 72 hours  Physical Exam: Neurologically intact ABD soft Neurovascular intact Intact pulses distally Incision: dressing C/D/I and no drainage Compartment soft  Assessment/Plan: Patient stable  xrays n/a Continue mobilization with physical therapy Continue care  Advance diet Up with therapy D/C IV fluids Discharge home with home health If cleared by PT will d/c to home today  Venita Lick, MD [facility] [phone]

## 2012-11-20 NOTE — Progress Notes (Signed)
Physical Therapy Treatment Patient Details Name: [name] [name] MRN: [id number] DOB: 1932-07-31 Today's Date: 11/20/2012 Time: 4098-1191 PT Time Calculation (min): 27 min  PT Assessment / Plan / Recommendation  History of Present Illness Revision and decompression diskectomy L2-L4   PT Comments   Pt. Tolerated treatment session well.  Follow Up Recommendations  Home health PT     Does the patient have the potential to tolerate intense rehabilitation     Barriers to Discharge        Equipment Recommendations  Rolling walker with 5" wheels    Recommendations for Other Services    Frequency Min 5X/week   Progress towards PT Goals Progress towards PT goals: Progressing toward goals  Plan Current plan remains appropriate    Precautions / Restrictions Precautions Precautions: Back Required Braces or Orthoses: Spinal Brace Spinal Brace: Lumbar corset;Applied in sitting position Restrictions Weight Bearing Restrictions: No   Pertinent Vitals/Pain None reported    Mobility  Bed Mobility Bed Mobility: Not assessed Transfers Transfers: Sit to Stand;Stand to Sit Sit to Stand: 5: Supervision;From chair/3-in-1 Stand to Sit: 5: Supervision;To chair/3-in-1 Ambulation/Gait Ambulation/Gait Assistance: 5: Supervision Ambulation Distance (Feet): 250 Feet Assistive device: Rolling walker Gait Pattern: Within Functional Limits Gait velocity: slightly decreased Stairs: Yes Stairs Assistance: 5: Supervision Stairs Assistance Details (indicate cue type and reason): with 1 hand rail and min cues to maintain precautions Stair Management Technique: One rail Left;Alternating pattern Number of Stairs: 2 Wheelchair Mobility Wheelchair Mobility: No    Exercises     PT Diagnosis:    PT Problem List:   PT Treatment Interventions:     PT Goals (current goals can now be found in the care plan section) Acute Rehab PT Goals Patient Stated Goal: To return home with wife PT Goal  Formulation: With patient/family Time For Goal Achievement: 11/26/12 Potential to Achieve Goals: Good  Visit Information  Last PT Received On: 11/20/12 Assistance Needed: +1 History of Present Illness: Revision and decompression diskectomy L2-L4    Subjective Data  Subjective: I went up the stairs in 2002. Patient Stated Goal: To return home with wife   Cognition  Cognition Arousal/Alertness: Awake/alert Behavior During Therapy: WFL for tasks assessed/performed Overall Cognitive Status: Within Functional Limits for tasks assessed    Balance  Balance Balance Assessed: Yes Static Standing Balance Static Standing - Balance Support: During functional activity;No upper extremity supported Static Standing - Comment/# of Minutes: 1  End of Session PT - End of Session Equipment Utilized During Treatment: Gait belt;Back brace Activity Tolerance: Patient tolerated treatment well Patient left: in chair;with call bell/phone within reach;with family/visitor present Nurse Communication: Mobility status   GP     [name] 11/20/2012, 9:34 AM  [name], PT, DPT [phone]

## 2012-11-20 NOTE — Plan of Care (Signed)
Problem: Consults Goal: Diagnosis - Spinal Surgery Outcome: Completed/Met Date Met:  11/20/12 Microdiscectomy--see operative note

## 2012-12-02 ENCOUNTER — Ambulatory Visit (INDEPENDENT_AMBULATORY_CARE_PROVIDER_SITE_OTHER): Payer: 59 | Admitting: Podiatry

## 2012-12-02 ENCOUNTER — Encounter: Payer: Self-pay | Admitting: Podiatry

## 2012-12-02 VITALS — BP 157/69 | HR 59 | Ht 66.0 in | Wt 260.0 lb

## 2012-12-02 DIAGNOSIS — B351 Tinea unguium: Secondary | ICD-10-CM

## 2012-12-02 DIAGNOSIS — M25579 Pain in unspecified ankle and joints of unspecified foot: Secondary | ICD-10-CM

## 2012-12-02 NOTE — Progress Notes (Signed)
Patient came in requesting toe nails trimmed. They are thick, long and bothering him.  Had back surgery 2 weeks ago and is doing well. Ambulating without assistance.   Objective: Hypertrophic deformed yellow nails x 10. Positive of pinch callus right hallux. No other acute problems noted.  Assessment: Onychomycosis x 10. Pinch callus right hallux.  Plan: Debrided all nails and callus.

## 2012-12-02 NOTE — Patient Instructions (Signed)
Seen for hypertrophic nails. All nails debrided. Return in 3 months or as needed.  

## 2013-02-03 ENCOUNTER — Encounter: Payer: Self-pay | Admitting: Podiatry

## 2013-02-03 ENCOUNTER — Ambulatory Visit (INDEPENDENT_AMBULATORY_CARE_PROVIDER_SITE_OTHER): Payer: 59 | Admitting: Podiatry

## 2013-02-03 VITALS — BP 167/74 | HR 58

## 2013-02-03 DIAGNOSIS — B351 Tinea unguium: Secondary | ICD-10-CM

## 2013-02-03 DIAGNOSIS — M25579 Pain in unspecified ankle and joints of unspecified foot: Secondary | ICD-10-CM

## 2013-02-03 NOTE — Progress Notes (Signed)
Patient came in accompanied by his wife requesting toe nails trimmed. They are thick, long and bothering him.  He is using OTC antifungal drops. Ambulating without assistance.   Objective: Hypertrophic deformed yellow nails x 10.  No other acute problems noted.   Assessment: Onychomycosis x 10.   Plan: Debrided all nails and callus.

## 2013-02-03 NOTE — Patient Instructions (Signed)
Seen for hypertrophic nails. All nails debrided. Return in 3 months or as needed.  

## 2013-04-07 ENCOUNTER — Other Ambulatory Visit: Payer: Self-pay | Admitting: Orthopedic Surgery

## 2013-04-07 ENCOUNTER — Encounter (HOSPITAL_BASED_OUTPATIENT_CLINIC_OR_DEPARTMENT_OTHER): Payer: Self-pay | Admitting: *Deleted

## 2013-04-07 NOTE — Progress Notes (Signed)
To come in for bmet-called dr Nadyne Coombes for ekg-notes and clearance-to bring cpap-and will wear it post op.

## 2013-04-08 ENCOUNTER — Encounter (HOSPITAL_BASED_OUTPATIENT_CLINIC_OR_DEPARTMENT_OTHER)
Admission: RE | Admit: 2013-04-08 | Discharge: 2013-04-08 | Disposition: A | Discharge status: Home / Self Care | Payer: Medicare Other | Source: Ambulatory Visit | Attending: Orthopedic Surgery | Admitting: Orthopedic Surgery

## 2013-04-08 DIAGNOSIS — Z01812 Encounter for preprocedural laboratory examination: Secondary | ICD-10-CM | POA: Insufficient documentation

## 2013-04-08 LAB — BASIC METABOLIC PANEL
BUN: 29 mg/dL — ABNORMAL HIGH (ref 6–23)
CO2: 25 meq/L (ref 19–32)
CREATININE: 0.9 mg/dL (ref 0.50–1.35)
Calcium: 9.8 mg/dL (ref 8.4–10.5)
Chloride: 101 mEq/L (ref 96–112)
GFR calc Af Amer: >90 mL/min (ref >90)
GFR calc non Af Amer: 79 mL/min — ABNORMAL LOW (ref >90)
GLUCOSE: 118 mg/dL — AB (ref 70–99)
Potassium: 4.1 mEq/L (ref 3.7–5.3)
Sodium: 142 mEq/L (ref 137–147)

## 2013-04-08 LAB — URIC ACID: Uric Acid, Serum: 5.4 mg/dL (ref 4.0–7.8)

## 2013-04-12 NOTE — H&P (Signed)
[name] [name] is an 80 y.o. male.   Chief Complaint: c/o chronic and progressive numbness and tingling of the right hand and STS right thumb HPI: Dr. Zebedee Iba to repeat electrodiagnostic studies of the right hand and arm only.  [name] [name] has persistent significant right carpal tunnel syndrome. There were no abnormal parameters for ulnar nerve conduction. He has been experiencing numbness in his right hand that he relates to a prior cardiac cath. He has a history of background gout. He does not know his last uric acid level but take Allopurinol 300 mg daily.   He was scheduled for cervical diskectomy and interbody fusion and had surgery aborted due to inability to intubate preoperatively.  Past Medical History  Diagnosis Date  . Complication of anesthesia     Pt difficult to intubate; 07/19/03 aborted surgery unable to intubate  . Pneumonia   . Kidney stones     Hx: of  . Sleep apnea     Hx: of pt wears CPAP  . Gout     Hx: of in left wrist  . Hearing deficit     Hx: of wears hearing aids  . Hypertension     Past Surgical History  Procedure Laterality Date  . Appendectomy    . Elbow surgery  1992    Hx: of-lt  . Rotator cuff repair  2003    Hx: of right  . 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 Cardiac catheterization      Hx: of 04/05/10  . Colonoscopy w/ biopsies and polypectomy      Hx: of  . Cataract extraction      Hx: of left eye only  . Lumbar laminectomy/decompression microdiscectomy N/A 11/18/2012    Procedure: REVISION L2 - L4 DECOMPRESSION DISCECTOMY 2 LEVELS;  Surgeon: Melina Schools, MD;  Location: [facility];  Service: Orthopedics;  Laterality: N/A;  . Coronary artery bypass graft  2012    Hx: of 5  . Tonsillectomy    . Joint replacement  2006    Hx: of left knee  . Hernia repair  1963    Hx: of right  . Colonoscopy    . Back surgery  2440,1027    lumb    Family History  Problem Relation Age of Onset  . Cancer - Colon Mother   .  Heart disease Father   . Alzheimer's disease Brother   . Dementia Other    Social History:  reports that he quit smoking about 35 years ago. His smoking use included Cigarettes. He smoked 0.00 packs per day. He has never used smokeless tobacco. He reports that he drinks alcohol. He reports that he does not use illicit drugs.  Allergies:  Allergies  Allergen Reactions  . Hydrocodone Nausea Only    Ok taking in low doses(5mg ), has nausea with higher doses  . Oxycodone Nausea Only  . Tramadol Nausea Only  . Cardizem [Diltiazem Hcl] Rash    No prescriptions prior to admission    No results found for this or any previous visit (from the past 48 hour(s)).  No results found.   Pertinent items are noted in HPI.  Height 5\' 6"  (1.676 m), weight 117.935 kg (260 lb).  General appearance: alert Head: Normocephalic, without obvious abnormality Neck: supple, symmetrical, trachea midline Resp: clear to auscultation bilaterally Cardio: regular rate and rhythm GI: normal findings: bowel sounds normal Extremities: . 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 He is able to close his fingers tight to the palm. He has active stenosing tenosynovitis of his right thumb at the A-1 pulley. He has positive provocative signs of carpal tunnel syndrome with positive Tinel's sign on the right and positive wrist flexion test on the right. His pulses and capillary refill are preserved. His radial pulse is strong despite having a prior radial artery catheterization. He has an intact ulnar pulse. He has fullness on the volar aspect of his right wrist that likely is synovial. He has a tortuous ulnar artery but the fullness is not a prominent ulnar artery segment.  Dr. Zebedee Iba completed electrodiagnostic studies. These confirmed significant bilateral carpal tunnel syndrome right much worse than left.   Pulses: 2+ and symmetric Skin:  normal Neurologic: Grossly normal    Assessment/Plan Impression: Right CTS and STS right thumb  Plan: To the OR for right CTR and release A-1 pulley right thumb.The procedure, risks,benefits and post-op course were discussed with the patient at length and they were in agreement with the plan.  DASNOIT,[name] J 04/12/2013, 4:41 PM   H&P documentation: 04/13/2013  -History and Physical Reviewed  -Patient has been re-examined  -No change in the plan of care  Cammie Sickle, MD

## 2013-04-13 ENCOUNTER — Ambulatory Visit (HOSPITAL_BASED_OUTPATIENT_CLINIC_OR_DEPARTMENT_OTHER): Payer: Medicare Other | Admitting: Anesthesiology

## 2013-04-13 ENCOUNTER — Ambulatory Visit (HOSPITAL_BASED_OUTPATIENT_CLINIC_OR_DEPARTMENT_OTHER)
Admission: RE | Admit: 2013-04-13 | Discharge: 2013-04-13 | Disposition: A | Discharge status: Home / Self Care | Payer: Medicare Other | Source: Ambulatory Visit | Attending: Orthopedic Surgery | Admitting: Orthopedic Surgery

## 2013-04-13 ENCOUNTER — Encounter (HOSPITAL_BASED_OUTPATIENT_CLINIC_OR_DEPARTMENT_OTHER)
Admission: RE | Disposition: A | Discharge status: Home / Self Care | Payer: Self-pay | Source: Ambulatory Visit | Attending: Orthopedic Surgery

## 2013-04-13 ENCOUNTER — Encounter (HOSPITAL_BASED_OUTPATIENT_CLINIC_OR_DEPARTMENT_OTHER): Payer: Self-pay | Admitting: *Deleted

## 2013-04-13 ENCOUNTER — Encounter (HOSPITAL_BASED_OUTPATIENT_CLINIC_OR_DEPARTMENT_OTHER): Payer: Medicare Other | Admitting: Anesthesiology

## 2013-04-13 DIAGNOSIS — G4730 Sleep apnea, unspecified: Secondary | ICD-10-CM | POA: Insufficient documentation

## 2013-04-13 DIAGNOSIS — I10 Essential (primary) hypertension: Secondary | ICD-10-CM | POA: Insufficient documentation

## 2013-04-13 DIAGNOSIS — Z87891 Personal history of nicotine dependence: Secondary | ICD-10-CM | POA: Insufficient documentation

## 2013-04-13 DIAGNOSIS — M65839 Other synovitis and tenosynovitis, unspecified forearm: Secondary | ICD-10-CM | POA: Insufficient documentation

## 2013-04-13 DIAGNOSIS — M109 Gout, unspecified: Secondary | ICD-10-CM | POA: Insufficient documentation

## 2013-04-13 DIAGNOSIS — G5600 Carpal tunnel syndrome, unspecified upper limb: Secondary | ICD-10-CM | POA: Insufficient documentation

## 2013-04-13 DIAGNOSIS — M65849 Other synovitis and tenosynovitis, unspecified hand: Secondary | ICD-10-CM

## 2013-04-13 DIAGNOSIS — Z87442 Personal history of urinary calculi: Secondary | ICD-10-CM | POA: Insufficient documentation

## 2013-04-13 HISTORY — PX: TRIGGER FINGER RELEASE: SHX641

## 2013-04-13 HISTORY — PX: CARPAL TUNNEL RELEASE: SHX101

## 2013-04-13 LAB — POCT HEMOGLOBIN-HEMACUE: HEMOGLOBIN: 15.7 g/dL (ref 13.0–17.0)

## 2013-04-13 SURGERY — CARPAL TUNNEL RELEASE
Anesthesia: General | Site: Wrist | Laterality: Right

## 2013-04-13 MED ORDER — ONDANSETRON HCL 4 MG/2ML IJ SOLN
INTRAMUSCULAR | Status: DC | PRN
  Administered 2013-04-13: 4 mg via INTRAVENOUS

## 2013-04-13 MED ORDER — MIDAZOLAM HCL 2 MG/2ML IJ SOLN: 1.0000 mg | INTRAMUSCULAR | Status: DC | PRN

## 2013-04-13 MED ORDER — MIDAZOLAM HCL 2 MG/2ML IJ SOLN
INTRAMUSCULAR | Status: AC
  Filled 2013-04-13: qty 2

## 2013-04-13 MED ORDER — FENTANYL CITRATE 0.05 MG/ML IJ SOLN
INTRAMUSCULAR | Status: AC
  Filled 2013-04-13: qty 2

## 2013-04-13 MED ORDER — ONDANSETRON HCL 4 MG/2ML IJ SOLN: 4.0000 mg | Freq: Once | INTRAMUSCULAR | Status: DC | PRN

## 2013-04-13 MED ORDER — FENTANYL CITRATE 0.05 MG/ML IJ SOLN
INTRAMUSCULAR | Status: DC | PRN
  Administered 2013-04-13: 25 ug via INTRAVENOUS
  Administered 2013-04-13: 75 ug via INTRAVENOUS

## 2013-04-13 MED ORDER — PROPOFOL INFUSION 10 MG/ML OPTIME
INTRAVENOUS | Status: DC | PRN
  Administered 2013-04-13: 170 mL via INTRAVENOUS

## 2013-04-13 MED ORDER — FENTANYL CITRATE 0.05 MG/ML IJ SOLN: 25.0000 ug | INTRAMUSCULAR | Status: DC | PRN

## 2013-04-13 MED ORDER — CEFAZOLIN SODIUM-DEXTROSE 2-3 GM-% IV SOLR
INTRAVENOUS | Status: AC
  Filled 2013-04-13: qty 50

## 2013-04-13 MED ORDER — DEXAMETHASONE SODIUM PHOSPHATE 10 MG/ML IJ SOLN
INTRAMUSCULAR | Status: DC | PRN
Start: 2013-04-13 — End: 2013-04-13
  Administered 2013-04-13: 10 mg via INTRAVENOUS

## 2013-04-13 MED ORDER — LIDOCAINE HCL 2 % IJ SOLN
INTRAMUSCULAR | Status: DC | PRN
  Administered 2013-04-13: 5 mL

## 2013-04-13 MED ORDER — FENTANYL CITRATE 0.05 MG/ML IJ SOLN: 50.0000 ug | INTRAMUSCULAR | Status: DC | PRN

## 2013-04-13 MED ORDER — CEFAZOLIN SODIUM-DEXTROSE 2-3 GM-% IV SOLR: 2.0000 g | INTRAVENOUS | Status: DC

## 2013-04-13 MED ORDER — EPHEDRINE SULFATE 50 MG/ML IJ SOLN
INTRAMUSCULAR | Status: DC | PRN
  Administered 2013-04-13: 25 mg via INTRAVENOUS

## 2013-04-13 MED ORDER — CHLORHEXIDINE GLUCONATE 4 % EX LIQD: 60.0000 mL | Freq: Once | CUTANEOUS | Status: DC

## 2013-04-13 MED ORDER — LACTATED RINGERS IV SOLN
INTRAVENOUS | Status: DC
  Administered 2013-04-13 (×2): via INTRAVENOUS

## 2013-04-13 MED ORDER — LIDOCAINE HCL (CARDIAC) 20 MG/ML IV SOLN
INTRAVENOUS | Status: DC | PRN
  Administered 2013-04-13: 50 mg via INTRAVENOUS

## 2013-04-13 SURGICAL SUPPLY — 48 items
BANDAGE ADH SHEER 1  50/CT (GAUZE/BANDAGES/DRESSINGS) IMPLANT
BANDAGE COBAN STERILE 2 (GAUZE/BANDAGES/DRESSINGS) IMPLANT
BANDAGE ELASTIC 3 VELCRO ST LF (GAUZE/BANDAGES/DRESSINGS) IMPLANT
BLADE SURG 15 STRL LF DISP TIS (BLADE) ×2 IMPLANT
BLADE SURG 15 STRL SS (BLADE) ×2
BNDG COHESIVE 3X5 TAN STRL LF (GAUZE/BANDAGES/DRESSINGS) IMPLANT
BNDG ESMARK 4X9 LF (GAUZE/BANDAGES/DRESSINGS) ×4 IMPLANT
BRUSH SCRUB EZ PLAIN DRY (MISCELLANEOUS) ×4 IMPLANT
CLOSURE WOUND 1/2 X4 (GAUZE/BANDAGES/DRESSINGS) ×1
CORDS BIPOLAR (ELECTRODE) ×4 IMPLANT
COVER MAYO STAND STRL (DRAPES) ×4 IMPLANT
COVER TABLE BACK 60X90 (DRAPES) ×4 IMPLANT
CUFF TOURNIQUET SINGLE 18IN (TOURNIQUET CUFF) IMPLANT
CUFF TOURNIQUET SINGLE 24IN (TOURNIQUET CUFF) ×4 IMPLANT
DECANTER SPIKE VIAL GLASS SM (MISCELLANEOUS) IMPLANT
DRAPE EXTREMITY T 121X128X90 (DRAPE) ×4 IMPLANT
DRAPE SURG 17X23 STRL (DRAPES) ×4 IMPLANT
GLOVE BIOGEL M STRL SZ7.5 (GLOVE) IMPLANT
GLOVE BIOGEL PI IND STRL 7.0 (GLOVE) ×4 IMPLANT
GLOVE BIOGEL PI INDICATOR 7.0 (GLOVE) ×4
GLOVE ECLIPSE 6.5 STRL STRAW (GLOVE) ×4 IMPLANT
GLOVE ORTHO TXT STRL SZ7.5 (GLOVE) ×4 IMPLANT
GOWN STRL REUS W/ TWL LRG LVL3 (GOWN DISPOSABLE) ×2 IMPLANT
GOWN STRL REUS W/ TWL XL LVL3 (GOWN DISPOSABLE) ×2 IMPLANT
GOWN STRL REUS W/TWL LRG LVL3 (GOWN DISPOSABLE) ×2
GOWN STRL REUS W/TWL XL LVL3 (GOWN DISPOSABLE) ×2
NDL SAFETY ECLIPSE 18X1.5 (NEEDLE) ×4 IMPLANT
NEEDLE 27GAX1X1/2 (NEEDLE) ×8 IMPLANT
NEEDLE HYPO 18GX1.5 SHARP (NEEDLE) ×4
PACK BASIN DAY SURGERY FS (CUSTOM PROCEDURE TRAY) ×4 IMPLANT
PAD CAST 3X4 CTTN HI CHSV (CAST SUPPLIES) ×2 IMPLANT
PADDING CAST ABS 4INX4YD NS (CAST SUPPLIES) ×2
PADDING CAST ABS COTTON 4X4 ST (CAST SUPPLIES) ×2 IMPLANT
PADDING CAST COTTON 3X4 STRL (CAST SUPPLIES) ×2
SPLINT PLASTER CAST XFAST 3X15 (CAST SUPPLIES) ×10 IMPLANT
SPLINT PLASTER XTRA FASTSET 3X (CAST SUPPLIES) ×10
SPONGE GAUZE 4X4 12PLY (GAUZE/BANDAGES/DRESSINGS) ×4 IMPLANT
STOCKINETTE 4X48 STRL (DRAPES) ×4 IMPLANT
STRIP CLOSURE SKIN 1/2X4 (GAUZE/BANDAGES/DRESSINGS) ×3 IMPLANT
SUT ETHILON 5 0 P 3 18 (SUTURE)
SUT NYLON ETHILON 5-0 P-3 1X18 (SUTURE) IMPLANT
SUT PROLENE 3 0 PS 2 (SUTURE) ×4 IMPLANT
SUT PROLENE 4 0 P 3 18 (SUTURE) IMPLANT
SYR 3ML 23GX1 SAFETY (SYRINGE) IMPLANT
SYR CONTROL 10ML LL (SYRINGE) ×8 IMPLANT
TOWEL OR 17X24 6PK STRL BLUE (TOWEL DISPOSABLE) ×4 IMPLANT
TRAY DSU PREP LF (CUSTOM PROCEDURE TRAY) ×4 IMPLANT
UNDERPAD 30X30 INCONTINENT (UNDERPADS AND DIAPERS) ×4 IMPLANT

## 2013-04-13 NOTE — Anesthesia Preprocedure Evaluation (Addendum)
Anesthesia Evaluation  Patient identified by MRN, date of birth, ID band Patient awake    Reviewed: Allergy & Precautions, H&P , NPO status   Airway Mallampati: II TM Distance: >3 FB   Mouth opening: Limited Mouth Opening  Dental  (+) Teeth Intact, Dental Advisory Given   Pulmonary former smoker,  breath sounds clear to auscultation        Cardiovascular hypertension, Rhythm:Regular Rate:Normal     Neuro/Psych    GI/Hepatic   Endo/Other    Renal/GU      Musculoskeletal   Abdominal   Peds  Hematology   Anesthesia Other Findings   Reproductive/Obstetrics                          Anesthesia Physical Anesthesia Plan  ASA: III  Anesthesia Plan: General   Post-op Pain Management:    Induction: Intravenous  Airway Management Planned: LMA  Additional Equipment:   Intra-op Plan:   Post-operative Plan:   Informed Consent: I have reviewed the patients History and Physical, chart, labs and discussed the procedure including the risks, benefits and alternatives for the proposed anesthesia with the patient or authorized representative who has indicated his/her understanding and acceptance.   Dental advisory given  Plan Discussed with: CRNA and Anesthesiologist  Anesthesia Plan Comments: (R. CTS and STS R. 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

## 2013-04-13 NOTE — Transfer of Care (Signed)
Immediate Anesthesia Transfer of Care Note  Patient: [name] [name]  Procedure(s) Performed: Procedure(s): RIGHT CARPAL TUNNEL RELEASE (Right) RIGHT THUMB A-1 PULLEY RELEASE (Right)  Patient Location: PACU  Anesthesia Type:General  Level of Consciousness: awake and patient cooperative  Airway & Oxygen Therapy: Patient Spontanous Breathing and Patient connected to face mask oxygen  Post-op Assessment: Report given to PACU RN and Post -op Vital signs reviewed and stable  Post vital signs: Reviewed and stable  Complications: No apparent anesthesia complications

## 2013-04-13 NOTE — Op Note (Signed)
[id number] 

## 2013-04-13 NOTE — Discharge Instructions (Addendum)

## 2013-04-13 NOTE — Brief Op Note (Signed)
04/13/2013  11:01 AM  PATIENT:  [name]  80 y.o. male  PRE-OPERATIVE DIAGNOSIS:  RIGHT CARPAL TUNNEL SYNDROME/RIGHT THUMB-TRIGGER  POST-OPERATIVE DIAGNOSIS:  RIGHT CARPAL TUNNEL SYNDROME/RIGHT THUMB-TRIGGER  PROCEDURE:  Procedure(s): RIGHT CARPAL TUNNEL RELEASE (Right) RIGHT THUMB A-1 PULLEY RELEASE (Right)  SURGEON:  Surgeon(s) and Role:    * Cammie Sickle., MD - Primary  PHYSICIAN ASSISTANT:   ASSISTANTS: surgical tech   ANESTHESIA:   none  EBL:     BLOOD ADMINISTERED:none  DRAINS: none   LOCAL MEDICATIONS USED:  LIDOCAINE   SPECIMEN:  No Specimen  DISPOSITION OF SPECIMEN:  N/A  COUNTS:  YES  TOURNIQUET:  * Missing tourniquet times found for documented tourniquets in log:  [id number] *  DICTATION: .Other Dictation: Dictation Number [id number]  PLAN OF CARE: Discharge to home after PACU  PATIENT DISPOSITION:  PACU - hemodynamically stable.   Delay start of Pharmacological VTE agent (>24hrs) due to surgical blood loss or risk of bleeding: not applicable

## 2013-04-13 NOTE — Anesthesia Postprocedure Evaluation (Signed)
  Anesthesia Post-op Note  Patient: [name] [name]  Procedure(s) Performed: Procedure(s): RIGHT CARPAL TUNNEL RELEASE (Right) RIGHT THUMB A-1 PULLEY RELEASE (Right)  Patient Location: PACU  Anesthesia Type:General  Level of Consciousness: awake, alert  and oriented  Airway and Oxygen Therapy: Patient Spontanous Breathing  Post-op Pain: mild  Post-op Assessment: Post-op Vital signs reviewed, Patient's Cardiovascular Status Stable, Respiratory Function Stable, Patent Airway and Pain level controlled  Post-op Vital Signs: stable  Complications: No apparent anesthesia complications

## 2013-04-13 NOTE — Op Note (Signed)
NAME:  [name] [name], [name] [name] NO.:  [id number]  MEDICAL RECORD NO.:  [id number]  LOCATION:                                 FACILITY:  PHYSICIAN:  Youlanda Mighty. Paiton Fosco, M.D. DATE OF BIRTH:  [birth year]  DATE OF PROCEDURE:  04/13/2013 DATE OF DISCHARGE:                              OPERATIVE REPORT   PREOPERATIVE DIAGNOSES: 1. Severe right carpal tunnel syndrome. 2. Right thumb stenosing tenosynovitis at A1 pulley.  POSTOPERATIVE DIAGNOSES: 1. Severe right carpal tunnel syndrome. 2. Right thumb stenosing tenosynovitis at A1 pulley.  OPERATION: 1. Release of right transverse carpal ligament. 2. Release of right thumb A1 pulley through separate incision.  OPERATING SURGEON:  Youlanda Mighty. Tove Wideman, M.D.  ASSISTANT:  Surgical technician.  ANESTHESIA:  General by LMA.  SUPERVISING ANESTHESIOLOGIST:  Glynda Jaeger, M.D.  INDICATIONS:  [name] [name] is a 80 year old gentleman, self referred for evaluation and management of a numb right hand and triggering of the right thumb.  [name] [name] is a long-term patient previously treated in 15.  He has developed a number of very complex medical problems including chronic gout, hypertension, obstructive sleep apnea, a history of coronary artery disease status post coronary artery bypass grafting x5.  He has been known to have a difficult intubation.  Preoperatively, he was thoroughly evaluated by Dr. Linna Caprice.  Dr. Linna Caprice was concerned about his history of obstructive sleep apnea, therefore he decided that his LMA would likely be a safer strategy.  We accepted Dr. Verneda Skill judgment and proceeded with general anesthesia by LMA technique at this time.  Preoperatively, [name] [name] was interviewed in the holding area.  His proper surgical sites of the right thumb and right palm were identified per protocol with a marking pen.  PROCEDURE IN DETAIL:  [name] [name] was brought to room 2 of the [facility] and placed in supine  position on the operating table.  Following induction of general anesthesia by LMA technique under Dr. Verneda Skill direct supervision, the right hand and arm were prepped with Betadine soap and solution, and sterilely draped.  A pneumatic tourniquet was applied to the proximal right brachium.  Following exsanguination of the right arm with Esmarch bandage, the arterial tourniquet was inflated to 220 mmHg.  Procedure commenced with a short transverse incision directly over the palpably thickened A1 pulley.  The subcutaneous tissues were carefully divided taking care to gently retract the radial proper digital artery and nerve to the thumb followed by identification of the A1 pulley.  The pulley was split with scalpel and scissors.  A Valora Corporal was passed proximally and distally alongside the flexor pollicis longus tendon.  No other sites of obstruction or compression were noted.  There was noted to be some infiltration of uric acid crystals into the region of the flexor pollicis longus tendon evidencing clinical gout.  The wound was then inspected for bleeding points followed by repair of the skin with intradermal 3-0 Prolene and Steri-Strips.  Attention was then directed to the proximal palm.  A 2.5-cm incision was fashioned paralleling the thenar crease in line of the ring finger.  Subcutaneous tissues were carefully divided taking care to identify and split the  palmar fascia in line of its fibers.  The distal margin of the transverse carpal ligament was identified.  There was an atypical vascular leash accompanying the distal median nerve that was intimately related to the branching of the common sensory branches to the index, long, and ring finger.  This was carefully dissected looking for an aberrant motor branch and none was identified.  The distal margin of the transverse carpal ligament was identified along its ulnar border and the ligaments sequentially released towards  the forearm with scissors.  There was noted to be marked thickening of the ulnar bursa and inflammation consistent with chronic gout.  The transverse carpal ligament was released along its ulnar border into the distal forearm as well the volar forearm fascia 4 cm above the distal wrist flexion crease.  Contents of canal were carefully inspected.  No obvious gouty deposits were noted, however, the tenosynovium was quite inflamed and fibrotic.  Bleeding points along the margin of the released ligament as well as from the synovium were electrocauterized with bipolar current.  No masses or other predicaments were identified.  The wound was then repaired with intradermal 3-0 Prolene and Steri- Strips.  A 2% lidocaine was infiltrated along the wound margins for postoperative comfort.  Total volume of 4.5 mL of 2% lidocaine was employed.  The hand was then dressed with sterile gauze, sterile Webril, and a volar plaster splint, maintaining the wrist in 15 degrees dorsiflexion.     Youlanda Mighty Jacquelynn Friend, M.D.     RVS/MEDQ  D:  04/13/2013  T:  04/13/2013  Job:  [id number]

## 2013-04-13 NOTE — Anesthesia Procedure Notes (Signed)
Procedure Name: LMA Insertion Date/Time: 04/13/2013 10:24 AM Performed by: Lyndee Leo Pre-anesthesia Checklist: Patient identified, Emergency Drugs available, Suction available and Patient being monitored Patient Re-evaluated:Patient Re-evaluated prior to inductionOxygen Delivery Method: Circle System Utilized Preoxygenation: Pre-oxygenation with 100% oxygen Intubation Type: IV induction Ventilation: Mask ventilation without difficulty LMA: LMA with gastric port inserted LMA Size: 5.0 Number of attempts: 1 Placement Confirmation: positive ETCO2 Tube secured with: Tape Dental Injury: Teeth and Oropharynx as per pre-operative assessment

## 2013-04-14 ENCOUNTER — Encounter (HOSPITAL_BASED_OUTPATIENT_CLINIC_OR_DEPARTMENT_OTHER): Payer: Self-pay | Admitting: Orthopedic Surgery

## 2013-06-09 ENCOUNTER — Ambulatory Visit (INDEPENDENT_AMBULATORY_CARE_PROVIDER_SITE_OTHER): Payer: 59 | Admitting: Podiatry

## 2013-06-09 ENCOUNTER — Encounter: Payer: Self-pay | Admitting: Podiatry

## 2013-06-09 VITALS — BP 130/77 | HR 57

## 2013-06-09 DIAGNOSIS — B351 Tinea unguium: Secondary | ICD-10-CM

## 2013-06-09 DIAGNOSIS — M79606 Pain in leg, unspecified: Secondary | ICD-10-CM | POA: Insufficient documentation

## 2013-06-09 DIAGNOSIS — M79609 Pain in unspecified limb: Secondary | ICD-10-CM

## 2013-06-09 NOTE — Patient Instructions (Signed)
Seen for hypertrophic nails. All nails debrided. Return in 3 months or as needed.  

## 2013-06-09 NOTE — Progress Notes (Signed)
Subjective:  80 year old male presents requesting toe nails trimmed. They are thick, long and bothering him.  Ambulating without assistance.   Objective: Hypertrophic deformed yellow nails x 10.  No other acute problems noted.   Assessment: Onychomycosis x 10.   Plan: Debrided all nails and callus.  

## 2013-09-14 ENCOUNTER — Encounter: Payer: Self-pay | Admitting: Podiatry

## 2013-09-14 ENCOUNTER — Ambulatory Visit (INDEPENDENT_AMBULATORY_CARE_PROVIDER_SITE_OTHER): Payer: 59 | Admitting: Podiatry

## 2013-09-14 VITALS — BP 131/67 | HR 64

## 2013-09-14 DIAGNOSIS — B351 Tinea unguium: Secondary | ICD-10-CM

## 2013-09-14 DIAGNOSIS — M79606 Pain in leg, unspecified: Secondary | ICD-10-CM

## 2013-09-14 DIAGNOSIS — M79609 Pain in unspecified limb: Secondary | ICD-10-CM

## 2013-09-14 NOTE — Progress Notes (Signed)
Subjective:  80 year old male presents requesting toe nails trimmed. They are thick, long and bothering him.  Ambulating without assistance.   Objective: Hypertrophic deformed yellow nails x 10.  No other acute problems noted.   Assessment: Onychomycosis x 10.   Plan: Debrided all nails and callus.

## 2013-10-11 ENCOUNTER — Ambulatory Visit: Payer: 59 | Admitting: Podiatry

## 2013-10-13 ENCOUNTER — Ambulatory Visit (INDEPENDENT_AMBULATORY_CARE_PROVIDER_SITE_OTHER): Payer: 59 | Admitting: Podiatry

## 2013-10-13 ENCOUNTER — Encounter: Payer: Self-pay | Admitting: Podiatry

## 2013-10-13 VITALS — BP 175/83 | HR 70

## 2013-10-13 DIAGNOSIS — M79605 Pain in left leg: Secondary | ICD-10-CM

## 2013-10-13 DIAGNOSIS — M79609 Pain in unspecified limb: Secondary | ICD-10-CM

## 2013-10-13 DIAGNOSIS — M659 Synovitis and tenosynovitis, unspecified: Secondary | ICD-10-CM | POA: Insufficient documentation

## 2013-10-13 NOTE — Patient Instructions (Signed)
Seen for injured left foot at 5th Metatarsal base. Ankle brace dispensed. Return if pain continues.

## 2013-10-13 NOTE — Progress Notes (Signed)
Subjective:  80 year old male presents complaining of pain on side of left foot upon light pressure. Patient points the dorsolateral 5th Metatarsal base being the source of pain. There is a mild edema and patient is able to walk without difficulty.   Objective: Cavovarus foot with elevated first ray left. Pain at 5th Metatarsal base left foot. Mild to minimum edema without erythema.  Assessment: Tenosynovitis Peroneus brevis at 5th Metatarsal base left.   Plan: Reviewed finedings. Placed in Ankle brace. Return if pain continues.

## 2013-12-21 ENCOUNTER — Ambulatory Visit (INDEPENDENT_AMBULATORY_CARE_PROVIDER_SITE_OTHER): Payer: 59 | Admitting: Podiatry

## 2013-12-21 ENCOUNTER — Encounter: Payer: Self-pay | Admitting: Podiatry

## 2013-12-21 DIAGNOSIS — M79606 Pain in leg, unspecified: Secondary | ICD-10-CM

## 2013-12-21 DIAGNOSIS — B351 Tinea unguium: Secondary | ICD-10-CM

## 2013-12-21 NOTE — Patient Instructions (Signed)
Seen for hypertrophic nails. All nails debrided. Return in 3 months or as needed.  

## 2013-12-21 NOTE — Progress Notes (Signed)
Subjective:  80 year old male presents requesting toe nails trimmed. They are thick, long and bothering him.  Ambulating without assistance.   Objective: Hypertrophic deformed yellow nails x 10.  No other acute problems noted.  Neurovascular status are within normal.  Assessment: Onychomycosis x 10.  Painful feet.   Plan: Debrided all nails and callus.

## 2014-03-23 ENCOUNTER — Ambulatory Visit: Payer: Self-pay | Admitting: Podiatry

## 2014-03-25 ENCOUNTER — Encounter: Payer: Self-pay | Admitting: Podiatry

## 2014-03-25 ENCOUNTER — Ambulatory Visit (INDEPENDENT_AMBULATORY_CARE_PROVIDER_SITE_OTHER): Payer: Medicare Other | Admitting: Podiatry

## 2014-03-25 VITALS — BP 151/67 | HR 60

## 2014-03-25 DIAGNOSIS — M79606 Pain in leg, unspecified: Secondary | ICD-10-CM

## 2014-03-25 DIAGNOSIS — B351 Tinea unguium: Secondary | ICD-10-CM

## 2014-03-25 NOTE — Progress Notes (Signed)
Subjective:  81 year old male presents requesting toe nails trimmed. They are thick, long and bothering him.  Ambulating without assistance.   Objective: Hypertrophic deformed yellow nails x 10.  No other acute problems noted.  Neurovascular status are within normal.  Assessment: Onychomycosis x 10.  Painful feet.   Plan: Debrided all nails. Return as needed.

## 2014-03-25 NOTE — Patient Instructions (Signed)
Seen for hypertrophic nails. All nails debrided. Return in 3 months or as needed.  

## 2014-06-17 ENCOUNTER — Encounter: Payer: Self-pay | Admitting: Podiatry

## 2014-06-17 ENCOUNTER — Ambulatory Visit (INDEPENDENT_AMBULATORY_CARE_PROVIDER_SITE_OTHER): Payer: Medicare Other | Admitting: Podiatry

## 2014-06-17 VITALS — BP 131/79 | HR 77

## 2014-06-17 DIAGNOSIS — M79606 Pain in leg, unspecified: Secondary | ICD-10-CM | POA: Diagnosis not present

## 2014-06-17 DIAGNOSIS — B351 Tinea unguium: Secondary | ICD-10-CM | POA: Diagnosis not present

## 2014-06-17 NOTE — Progress Notes (Signed)
Subjective:  81 year old male presents requesting toe nails trimmed.  Concerned with a dark spot at the tip of 2nd toe left.  They are thick, long and bothering him.  Ambulating without assistance.   Objective: Hypertrophic deformed yellow nails x 10.  Dry old blood as a dark spot distal end of 2nd toe nail left.  No other acute problems noted.  Neurovascular status are within normal.  Assessment: Onychomycosis x 10.  Painful feet.   Plan: Debrided all nails. Return as needed.

## 2014-06-17 NOTE — Patient Instructions (Signed)
Seen for hypertrophic nails. All nails debrided. Return in 3 months or as needed.  

## 2014-08-24 ENCOUNTER — Encounter: Payer: Self-pay | Admitting: Podiatry

## 2014-08-24 ENCOUNTER — Ambulatory Visit (INDEPENDENT_AMBULATORY_CARE_PROVIDER_SITE_OTHER): Payer: Medicare Other | Admitting: Podiatry

## 2014-08-24 VITALS — BP 131/72 | HR 65

## 2014-08-24 DIAGNOSIS — M79606 Pain in leg, unspecified: Secondary | ICD-10-CM | POA: Diagnosis not present

## 2014-08-24 DIAGNOSIS — B351 Tinea unguium: Secondary | ICD-10-CM

## 2014-08-24 NOTE — Progress Notes (Signed)
Subjective:  81 year old male presents requesting toe nails trimmed.   Objective: Hypertrophic deformed yellow nails x 10.  No other acute problems noted.  Neurovascular status are within normal.  Assessment: Onychomycosis x 10.  Painful feet.   Plan: Debrided all nails. Return as needed.

## 2014-08-24 NOTE — Patient Instructions (Signed)
Seen for hypertrophic nails. All nails debrided. Return in 3 months or as needed.  

## 2014-10-17 ENCOUNTER — Ambulatory Visit (INDEPENDENT_AMBULATORY_CARE_PROVIDER_SITE_OTHER): Payer: Medicare Other | Admitting: Podiatry

## 2014-10-17 ENCOUNTER — Encounter: Payer: Self-pay | Admitting: Podiatry

## 2014-10-17 VITALS — BP 147/69 | HR 64

## 2014-10-17 DIAGNOSIS — M79606 Pain in leg, unspecified: Secondary | ICD-10-CM | POA: Diagnosis not present

## 2014-10-17 DIAGNOSIS — B351 Tinea unguium: Secondary | ICD-10-CM

## 2014-10-17 NOTE — Progress Notes (Signed)
Subjective:  81 year old male presents requesting toe nails trimmed. Thick long nails hurt in shoes. No new problems.   Objective: Hypertrophic deformed yellow nails x 10.  No other acute problems noted.  Neurovascular status are within normal. No gross deformities in osseous structures bilateral.   Assessment: Onychomycosis x 10.  Painful feet.   Plan: Debrided all nails. Return as needed.

## 2014-10-17 NOTE — Patient Instructions (Signed)
Seen for hypertrophic nails. All nails debrided. Return in 3 months or as needed.  

## 2014-11-22 ENCOUNTER — Telehealth: Payer: Self-pay | Admitting: Internal Medicine

## 2014-11-22 NOTE — Telephone Encounter (Signed)
Spoke with pt's wife, states that pt needs new cpap supplies order sent to [place].  I advised that pt has not been seen since 2011 and will need an ov before supplies can be sent for pt.  Offered to schedule next available consult (currently with [facility]), wife states she needs to talk to husband before scheduling appt.  Will await call back.

## 2014-11-23 NOTE — Telephone Encounter (Signed)
Spoke with the pt's spouse  She states that the pt is out of town  She will make him aware that he needs to re-est for sleep with any provider (except [facility])  I will close encounter since he knows he needs to call for appt

## 2015-01-12 ENCOUNTER — Ambulatory Visit (INDEPENDENT_AMBULATORY_CARE_PROVIDER_SITE_OTHER): Payer: Medicare Other | Admitting: Podiatry

## 2015-01-12 ENCOUNTER — Encounter: Payer: Self-pay | Admitting: Podiatry

## 2015-01-12 VITALS — BP 117/65 | HR 59

## 2015-01-12 DIAGNOSIS — B351 Tinea unguium: Secondary | ICD-10-CM | POA: Diagnosis not present

## 2015-01-12 DIAGNOSIS — M79606 Pain in leg, unspecified: Secondary | ICD-10-CM

## 2015-01-12 NOTE — Progress Notes (Signed)
Subjective:  81 year old male presents requesting toe nails trimmed. Thick long nails hurt in shoes. No new problems.   Objective: Hypertrophic deformed yellow nails x 10.  No other acute problems noted.  Neurovascular status are within normal. No gross deformities in osseous structures bilateral.   Assessment: Onychomycosis x 10.  Painful feet.   Plan: Debrided all nails. Return as needed

## 2015-01-12 NOTE — Patient Instructions (Signed)
Seen for hypertrophic nails. All nails debrided. Return in 3 months or as needed.  

## 2015-05-23 ENCOUNTER — Other Ambulatory Visit: Payer: Self-pay | Admitting: Family Medicine

## 2015-05-23 ENCOUNTER — Ambulatory Visit
Admission: RE | Admit: 2015-05-23 | Discharge: 2015-05-23 | Disposition: A | Discharge status: Home / Self Care | Payer: Medicare Other | Source: Ambulatory Visit | Attending: Family Medicine | Admitting: Family Medicine

## 2015-05-23 DIAGNOSIS — Z139 Encounter for screening, unspecified: Secondary | ICD-10-CM

## 2015-06-01 ENCOUNTER — Encounter: Payer: Self-pay | Admitting: Neurology

## 2015-06-01 ENCOUNTER — Ambulatory Visit (INDEPENDENT_AMBULATORY_CARE_PROVIDER_SITE_OTHER): Payer: Medicare Other | Admitting: Neurology

## 2015-06-01 ENCOUNTER — Other Ambulatory Visit: Payer: Self-pay | Admitting: Neurology

## 2015-06-01 VITALS — BP 158/72 | HR 70 | Resp 18 | Ht 65.0 in | Wt 265.0 lb

## 2015-06-01 DIAGNOSIS — T1590XA Foreign body on external eye, part unspecified, unspecified eye, initial encounter: Secondary | ICD-10-CM

## 2015-06-01 DIAGNOSIS — Z820 Family history of epilepsy and other diseases of the nervous system: Secondary | ICD-10-CM | POA: Diagnosis not present

## 2015-06-01 DIAGNOSIS — R413 Other amnesia: Secondary | ICD-10-CM

## 2015-06-01 DIAGNOSIS — Z9989 Dependence on other enabling machines and devices: Secondary | ICD-10-CM

## 2015-06-01 DIAGNOSIS — G4733 Obstructive sleep apnea (adult) (pediatric): Secondary | ICD-10-CM | POA: Diagnosis not present

## 2015-06-01 NOTE — Progress Notes (Signed)
Subjective:    Patient ID: [name] [name] is a 82 y.o. male.  HPI     Star Age, MD, PhD [facility] [address], [address] [place], [place] [address]  Dear Dr. Rex Kras,   I saw your patient, [name] [name], upon your kind request in my neurologic clinic today for initial consultation of his memory loss. The patient is accompanied by his wife today. As you know, Mr. Cone is an 10 year old right-handed gentleman with an underlying complex medical history of coronary artery disease, status post CABG 5, hypothyroidism, carotid artery disease, s/p L CEA in 7/04, type 2 diabetes, gout, chronic pain, s/p lumbar laminectomy in 10/09, (prior surgeries in '63, and '84, last one in 2014), obstructive sleep apnea, on CPAP for many years, mood disorder, heart disease, multiple myeloma, hypertension, arthritis, s/p L TKA, and vitamin D deficiency, who reports short-term memory loss. He has forgetfulness and sometimes misplaces things but overall is primarily worried about Alzheimer's disease because he lost one sister to Alzheimer's disease, she passed away at age 14 and he has an 7 year old brother who has Alzheimer's disease and is now in a nursing home. His other sister, age 44 had an intracranial hemorrhage but is doing better now. He is the youngest of 4 siblings. His parents had twins after he was born but they did not survive. He had blood work recently through your office. His A1c was below 7. I reviewed test results they brought in today. He has also recently seen his cardiologist, Dr. Einar Gip. He has one grown daughter, one granddaughter. He lives with his wife of 52 years. He quit smoking over 30 years ago and does not drink alcohol. 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 I reviewed your office note from 05/18/2015, which you kindly included. He stays active physically. He has been overweight for a long time. He knows he needs to eat better. He  admits that he does not drink enough water.  His Past Medical History Is Significant For: Past Medical History  Diagnosis Date  . Complication of anesthesia     Pt difficult to intubate; 07/19/03 aborted surgery unable to intubate  . Pneumonia   . Kidney stones     Hx: of  . Sleep apnea     Hx: of pt wears CPAP  . Gout     Hx: of in left wrist  . Hearing deficit     Hx: of wears hearing aids  . Hypertension   . Multiple myeloma ([place])   . Occlusion and stenosis of unspecified carotid artery   . Diabetes mellitus without complication ([place])   . 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 Dysthymic disorder   . Rosacea     His Past Surgical History Is Significant For: Past Surgical History  Procedure Laterality Date  . Appendectomy    . Elbow surgery  1992    Hx: of-lt  . Rotator cuff repair  2003    Hx: of right  . 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 Cardiac catheterization      Hx: of 04/05/10  . Colonoscopy w/ biopsies and polypectomy      Hx: of  . Cataract extraction      Hx: of left eye only  . Lumbar laminectomy/decompression microdiscectomy N/A 11/18/2012    Procedure: REVISION L2 - L4 DECOMPRESSION DISCECTOMY 2 LEVELS;  Surgeon: Melina Schools, MD;  Location: [place];  Service: Orthopedics;  Laterality: N/A;  . Coronary artery bypass graft  2012    Hx: of 5  . Tonsillectomy    . Joint replacement  2006    Hx: of left knee  . Hernia repair  1963    Hx: of right  . Colonoscopy    . Back surgery  [id number]    lumb  . Carpal tunnel release Right 04/13/2013    Procedure: RIGHT CARPAL TUNNEL RELEASE;  Surgeon: Cammie Sickle., MD;  Location: [place];  Service: Orthopedics;  Laterality: Right;  . Trigger finger release Right 04/13/2013    Procedure: RIGHT THUMB A-1 PULLEY RELEASE;  Surgeon: Cammie Sickle., MD;  Location: [place];  Service: Orthopedics;  Laterality: Right;    His Family  History Is Significant For: Family History  Problem Relation Age of Onset  . Cancer - Colon Mother   . Heart disease Father   . Alzheimer's disease Brother   . Dementia Other   . Alzheimer's disease Sister   . Alzheimer's disease Brother     His Social History Is Significant For: Social History   Social History  . Marital Status: Married    Spouse Name: N/A  . Number of Children: 1  . Years of Education: college   Occupational History  . Retired     Social History Main Topics  . Smoking status: Former Smoker    Types: Cigarettes    Quit date: 04/08/1978  . Smokeless tobacco: Never Used     Comment: Quit smoking cigarretes in 1980  . Alcohol Use: No  . Drug Use: No  . Sexual Activity: Not Asked   Other Topics Concern  . None   Social History Narrative   Rare caffeine use     His Allergies Are:  Allergies  Allergen Reactions  . Ciprofloxacin   . Hydrocodone Nausea Only    Ok taking in low doses('5mg'$ ), has nausea with higher doses  . Levofloxacin Other (See Comments)    Pain/cramping   . Oxycodone Nausea Only  . Tramadol Nausea Only  . Cardizem [Diltiazem Hcl] Rash  :   His Current Medications Are:  Outpatient Encounter Prescriptions as of 06/01/2015  Medication Sig  . allopurinol (ZYLOPRIM) 300 MG tablet Take 300 mg by mouth daily.   [name] aspirin EC 81 MG tablet Take 81 mg by mouth daily.  . bisoprolol (ZEBETA) 10 MG tablet Take 10 mg by mouth every morning.   . Calcium Carbonate-Vitamin D (CALCIUM + D PO) Take 1,200 mg by mouth daily.  . fexofenadine (ALLEGRA) 180 MG tablet Take 180 mg by mouth daily as needed (for allergies).   [name] levothyroxine (SYNTHROID, LEVOTHROID) 100 MCG tablet Take 100 mcg by mouth daily before breakfast.  . lisinopril-hydrochlorothiazide (PRINZIDE,ZESTORETIC) 10-12.5 MG per tablet Take 1 tablet by mouth every morning.   [name] LOVAZA 1 G capsule Take 1 g by mouth 3 (three) times daily.   . Multiple Vitamin (MULTIVITAMIN) tablet Take 1  tablet by mouth daily.  [name] NITROSTAT 0.4 MG SL tablet   . Polyethyl Glycol-Propyl Glycol (SYSTANE OP) Apply 1 drop to eye 2 (two) times daily. Each eye  . rosuvastatin (CRESTOR) 20 MG tablet Take 20 mg by mouth at bedtime.  . Vitamin D, Ergocalciferol, (DRISDOL) 50000 UNITS CAPS Take 50,000 Units by mouth every 7 (seven) days.   . [DISCONTINUED] losartan (COZAAR) 50 MG tablet Take 50 mg by mouth every morning.  . [  DISCONTINUED] methocarbamol (ROBAXIN) 500 MG tablet Take 1 tablet (500 mg total) by mouth 3 (three) times daily as needed.  . [DISCONTINUED] ondansetron (ZOFRAN) 4 MG tablet Take 1 tablet (4 mg total) by mouth every 8 (eight) hours as needed for nausea.  . [DISCONTINUED] polyethylene glycol powder (GLYCOLAX) powder Take 17 g by mouth daily.  . [DISCONTINUED] testosterone enanthate (DELATESTRYL) 200 MG/ML injection Inject 200 mg into the muscle See admin instructions. For IM use only, injects '200mg'$  every 6 weeks   No facility-administered encounter medications on file as of 06/01/2015.  :  Review of Systems:  Out of a complete 14 point review of systems, all are reviewed and negative with the exception of these symptoms as listed below:  Review of Systems  Neurological:       Patient requests for a memory evaluation. States that he has 2 brother and 1 sister that have or had Alzheimer's. He feels that at times he may have some memory issues.     Objective:  Neurologic Exam  Physical Exam Physical Examination:   Filed Vitals:   06/01/15 1322  BP: 158/72  Pulse: 70  Resp: 18   General Examination: The patient is a very pleasant 82 y.o. male in no acute distress. He appears well-developed and well-nourished and well groomed. He is obese. He is in good spirits today.  HEENT: Normocephalic, atraumatic, pupils are equal, round and reactive to light and accommodation. Funduscopic exam is normal with sharp disc margins noted. Hearing is impaired bilaterally. He has a set of hearing  aids but does not use them according to his wife. He is status post bilateral cataract repairs. Face is symmetric with normal facial animation and normal facial sensation. Speech is clear with no dysarthria noted. There is no hypophonia. There is no lip, neck/head, jaw or voice tremor. Neck is supple with full range of passive and active motion. There are no carotid bruits on auscultation. Unremarkable scar from left carotid endarterectomy. Oropharynx exam reveals mild mouth dryness and moderate airway crowding. He has a large neck of 20-3/4 inches circumference. Tonsils are absent.  Chest: Clear to auscultation without wheezing, rhonchi or crackles noted.  Heart: S1+S2+0, regular and normal without murmurs, rubs or gallops noted.   Abdomen: Soft, non-tender and non-distended with normal bowel sounds appreciated on auscultation.  Extremities: There is 1+ pitting edema in the distal lower extremities bilaterally. Pedal pulses are intact.  Skin: Warm and dry without trophic changes noted. There are no varicose veins.  Musculoskeletal: exam reveals no obvious joint deformities, tenderness or joint swelling or erythema.   Neurologically:  Mental status: The patient is awake, alert and oriented in all 4 spheres. His immediate and remote memory, attention, language skills and fund of knowledge are appropriate. There is no evidence of aphasia, agnosia, apraxia or anomia. Speech is clear with normal prosody and enunciation. Thought process is linear. Mood is normal and affect is normal.   On 06/01/2015: MMSE 26/30, CDT: 3-4/4, AFT: 12/min, GDS: 1/15.   Cranial nerves II - XII are as described above under HEENT exam. In addition: shoulder shrug is normal with equal shoulder height noted. Motor exam: Normal bulk, strength and tone is noted. There is no drift, tremor or rebound. Romberg is negative. Reflexes are 1+ in the upper extremities, trace in lower extremities. Fine motor skills are mildly impaired  globally. He stands up with mild difficulty, mostly because of body habitus. He stands slightly wide-based. He walks without problems. He turns without  issues, balance is age-appropriate. Sensory exam is intact to all modalities in the upper and lower extremity is with the exception of decrease vibratory sense distally in the lower extremities, up to mid shin areas.   Assessment and Plan:   In summary, YAHSHUA THIBAULT is a very pleasant 82 y.o.-year old male with an underlying complex medical history of coronary artery disease, status post CABG 5, hypothyroidism, carotid artery disease, s/p L CEA in 7/04, type 2 diabetes, gout, chronic pain, s/p lumbar laminectomy in 10/09, (prior surgeries in '63, and '84, last one in 2014), obstructive sleep apnea, on CPAP for many years, mood disorder, heart disease, multiple myeloma, hypertension, arthritis, s/p L TKA, and vitamin D deficiency, who reports short-term memory loss. On examination, he has mild memory loss and memory scores could be in keeping with mild cognitive impairment, versus age-appropriate memory disturbance. He does have several risk factors for vascular dementia. His family history is positive for Alzheimer's disease. I talked to the patient and his wife at length about his concerns, he has a fairly reassuring exam. He is advised that we can do further evaluation form of brain MRI without contrast and formal neuropsychological evaluation with a licensed neuropsychologist. He is agreeable. He is advised to stay better hydrated with water and stay active physically but not overdo it. He can stay outside and work with his tractor for about 3 hours at a time and does not always take water with him.  We talked about maintaining a healthy lifestyle in general and staying active mentally and physically. I encouraged the patient to eat healthy, exercise daily and keep well hydrated, to keep a scheduled bedtime and wake time routine, to not skip any meals and  eat healthy snacks in between meals and to have protein with every meal. I stressed the importance of regular exercise, within of course the patient's own mobility limitations. I encouraged the patient to keep up with current events by reading the news paper or watching the news and to do word puzzles, or if feasible, to go on [email or web address].    As far as further diagnostic testing is concerned, I suggested the following: Brain MRI without contrast, formal neuropsychological testing. I made a referral in that regard and ordered a brain scan. We will keep them posted as to his test results via phone call. I will not initiate any new medications at this time. He is advised to work on weight loss and stay compliant with CPAP therapy. I will see him back routinely in about 6 months, sooner if needed. I answered all their questions today and the patient and his wife were in agreement. Thank you very much for allowing me to participate in the care of this nice patient. If I can be of any further assistance to you please do not hesitate to call me at [phone].  Sincerely,   Star Age, MD, PhD

## 2015-06-01 NOTE — Patient Instructions (Signed)
You have complaints of memory loss: memory loss or changes in cognitive function can have many reasons and does not always mean you have dementia. Conditions that can contribute to subjective or objective memory loss include: depression, stress, poor sleep from insomnia or sleep apnea, dehydration, fluctuation in blood sugar values, thyroid or electrolyte dysfunction and certain vitamin deficiencies. Dementia can be caused by stroke, brain atherosclerosis or brain vascular disease due to vascular risk factors (smoking, high blood pressure, high cholesterol, obesity and uncontrolled diabetes), certain degenerative brain disorders (including Parkinson's disease and Multiple sclerosis) and by Alzheimer's disease or other, more rare and sometimes hereditary causes. We will do some additional testing: blood work (which has been done recently already) and we will do a brain scan. We will not start medication as yet. We will also request a formal cognitive test called neuropsychological evaluation which is done by a licensed neuropsychologist. We will make a referral in that regard. We will call you with brain scan test results and monitor your symptoms. Your memory loss is rather mild at this point, which, of course is reassuring.      

## 2015-06-12 ENCOUNTER — Ambulatory Visit
Admission: RE | Admit: 2015-06-12 | Discharge: 2015-06-12 | Disposition: A | Discharge status: Home / Self Care | Payer: Medicare Other | Source: Ambulatory Visit | Attending: Neurology | Admitting: Neurology

## 2015-06-12 DIAGNOSIS — G4733 Obstructive sleep apnea (adult) (pediatric): Secondary | ICD-10-CM

## 2015-06-12 DIAGNOSIS — R413 Other amnesia: Secondary | ICD-10-CM

## 2015-06-12 DIAGNOSIS — Z820 Family history of epilepsy and other diseases of the nervous system: Secondary | ICD-10-CM | POA: Diagnosis not present

## 2015-06-12 DIAGNOSIS — Z9989 Dependence on other enabling machines and devices: Secondary | ICD-10-CM

## 2015-06-12 NOTE — Progress Notes (Signed)
Quick Note:  Please call patient regarding the recent brain MRI: The brain scan showed a normal structure of the brain and mild volume loss which we call atrophy, but acceptable for his age. There were changes in the deeper structures of the brain, which we call white matter changes or microvascular changes. These were reported as moderate in his case. These are tiny white spots, that occur with time and are seen in a variety of conditions, including with normal aging, chronic hypertension, chronic headaches, especially migraine HAs, chronic diabetes, chronic hyperlipidemia. These are not strokes and no mass or lesion were seen which is reassuring. Again, there were no acute findings, such as a stroke, or mass or blood products. No further action is required on this test at this time, other than re-enforcing the importance of good blood pressure control, good cholesterol control, good blood sugar control, and weight management. Please remind patient to keep any upcoming appointments or tests and to call us with any interim questions, concerns, problems or updates. Thanks,  Star Age, MD, PhD    ______

## 2015-06-13 ENCOUNTER — Telehealth: Payer: Self-pay

## 2015-06-13 NOTE — Telephone Encounter (Signed)
-----   Message from Star Age, MD sent at 06/12/2015  6:17 PM EDT ----- Please call patient regarding the recent brain MRI: The brain scan showed a normal structure of the brain and mild volume loss which we call atrophy, but acceptable for his age. There were changes in the deeper structures of the brain, which we call white matter changes or microvascular changes. These were reported as moderate in his case. These are tiny white spots, that occur with time and are seen in a variety of conditions, including with normal aging, chronic hypertension, chronic headaches, especially migraine HAs, chronic diabetes, chronic hyperlipidemia. These are not strokes and no mass or lesion were seen which is reassuring. Again, there were no acute findings, such as a stroke, or mass or blood products. No further action is required on this test at this time, other than re-enforcing the importance of good blood pressure control, good cholesterol control, good blood sugar control, and weight management. Please remind patient to keep any upcoming appointments or tests and to call us with any interim questions, concerns, problems or updates. Thanks,  Star Age, MD, PhD

## 2015-06-13 NOTE — Telephone Encounter (Signed)
Patient was unable to come to phone. Wife answered and said that she would relay information below to patient.

## 2015-07-20 ENCOUNTER — Encounter: Payer: Self-pay | Admitting: Podiatry

## 2015-07-20 ENCOUNTER — Ambulatory Visit (INDEPENDENT_AMBULATORY_CARE_PROVIDER_SITE_OTHER): Payer: Medicare Other | Admitting: Podiatry

## 2015-07-20 VITALS — BP 136/70 | HR 59

## 2015-07-20 DIAGNOSIS — B351 Tinea unguium: Secondary | ICD-10-CM | POA: Diagnosis not present

## 2015-07-20 DIAGNOSIS — M79606 Pain in leg, unspecified: Secondary | ICD-10-CM | POA: Diagnosis not present

## 2015-07-20 NOTE — Progress Notes (Signed)
Subjective:  82 year old male presents requesting toe nails trimmed. Thick long nails hurt in shoes.   Objective: Hypertrophic deformed yellow nails x 10.  No other acute problems noted.  Neurovascular status are within normal. No gross deformities in osseous structures bilateral.   Assessment: Onychomycosis x 10.  Painful feet.   Plan: Debrided all nails. Return as needed

## 2015-07-20 NOTE — Patient Instructions (Signed)
Seen for hypertrophic nails. All nails debrided. Return in 3 months or as needed.  

## 2015-09-13 ENCOUNTER — Ambulatory Visit: Payer: Medicare Other | Attending: Psychology | Admitting: Psychology

## 2015-09-13 DIAGNOSIS — R413 Other amnesia: Secondary | ICD-10-CM | POA: Diagnosis not present

## 2015-09-13 NOTE — Progress Notes (Signed)
[facility]  [address]   Telephone [phone] [address] Fax [phone] [place], [place] [address]  Initial Contact Note  Name:  DEZHON HOLLENBACK Date of Birth; 09/01/1932 MRN:  [id number] Date:  09/13/2015  GILAD VANWIE is an 82 y.o. male who was referred for neuropsychological evaluation by Star Age, MD due to his concerns about memory in context of his family histiry of Alzeihmer's disase.    A total of 5 hours was spent today reviewing medical records, interviewing (CPT [phone]) [name] and administering and scoring neurocognitive tests (CPT [phone] & [phone]).  Preliminary Diagnostic Impression: Complaints of memory disturbance [R41.3]  There were no concerns expressed or behaviors displayed by [name] that would require immediate attention.   A full report will follow once the planned testing has been completed. His next appointment is scheduled for 09/22/15.   Jamey Ripa, Ph.D Licensed Psychologist 09/13/2015

## 2015-09-22 ENCOUNTER — Ambulatory Visit (INDEPENDENT_AMBULATORY_CARE_PROVIDER_SITE_OTHER): Payer: Medicare Other | Admitting: Psychology

## 2015-09-22 DIAGNOSIS — R413 Other amnesia: Secondary | ICD-10-CM

## 2015-09-22 NOTE — Progress Notes (Signed)
[facility]  [address]   Telephone [phone] [address] Fax [phone] [place],  [address]   [place]* This report should not be released without the consent of the client  Name:   [name] [name]   Date of Birth:  2033/01/04 [facility] MR#:  [id number] Date of Evaluation: 09/13/15   Reason for Referral [name] [name] is an 82 year old right-handed man who was referred for neuropsychological evaluation by [name] Age, MD of [facility]. [name] [name] has reported some signs of forgetfulness that have worried him given that two of his siblings had developed Alzheimer's disease. An MRI of the brain on 06/12/15 showed mild cortical atrophy that was within expected limits for his [name] and chronic microvascular white matter ischemic changes that were more than expected for his [name].   Sources of Information Electronic medical records from the [place] were reviewed. [name] [name] and his wife, [name] [name], were interviewed.   Chief Complaints & Current Status [name] [name] did not report having any cognitive difficulties though expressed worry given his family history of Alzheimer's disease. His wife stated that he has seemed more forgetful than usual over the past five to six months as characterized by forgetting some recent events. Other problems reported included back pain elicited by bending and bilateral hearing loss (he reported that he has not been wearing his hearing aids as they often make a distracting whistling noise). There was no report of any problems with mood or changes in his personality or habits. His wife stated that for as long as she has known him he has exhibited fidgetiness and inattentiveness which has not changed. He has continued to perform his basic and instrumental activities of daily living without difficulty.  Background He lives with his wife of 82 seven years.  They have an adult daughter.  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 He failed some English classes but was never retained in grade. He did not receive special education services.   His past medical history was notable for arthritis (history of left total knee replacement), chronic back pain (history of multiple back surgeries most recently in 2014), coronary artery disease (CABG 5 in 2012), carotid artery disease (left CEA in 2004), Diabetes Type 2, hearing loss (uses hearing aids), hypertension, hypothyroidism, gout, multiple myeloma obstructive sleep apnea (on CPAP since 1998) and vitamin D deficiency,   His current medications include allopurinol, aspirin, bisoprolol, levothyroxine, lisinopril-hydrochlorothiazide, rosuvastatin and Vitamin D.  He reported no history of head injury, loss of consciousness, seizure activity, stroke-like symptoms or exposure to toxic chemicals.   He reported no past or current abuse of alcohol or use of illegal drugs. He quit smoking in 1980.  He reported no history of emotional difficulties, use of psychiatric medication or mental health contacts.  His family history was notable for a sister with Alzheimer's disease who died at the [name] of 67 and an 6 year-old brother who has Alzheimer's disease and currently resides in a nursing home.  Evaluation Procedures In addition to a review of medical records as well as clinical interview, the following tests or questionnaires were administered: Animal Naming Test  [facility] Naming Test Controlled Oral Word Association Test Geriatric Depression Scale (short form) Modified [employer]  Figure: Copy Trail Making A & B Wechsler Adult Intelligence Scale-IV:          Music therapist,  Coding, Digit Span.         Similarities & Vocabulary  Wechsler Memory Scale-IV: Older Adult Battery Wide Range Achievement Test-4 (Word Reading)  Assessment Results Observations & Interpretative Considerations Test results were overall deemed to represent a valid measure of his cognitive functioning.  He did not exhibit signs of physical or emotional discomfort. He did not report or display problems with vision, hearing (he did not wear his hearing aids but was able to adequately hear in the quiet office setting) or motor control. He appeared to maintain alertness, sustain attention and persist to task. There were no signs of careless, impulsive or perseverative responding. He was judged to have put forth optimal effort.    It was difficult to estimate his baseline intellectual level. A measure of his word reading skill (Wide Range Achievement Test-4), which usually provides a reliable estimate of pre-morbid intellectual functioning, fell within the Borderline range and was commensurate with a 2nd to 3rd grade level. His fund of word knowledge (WAIS-IV Vocabulary) fell at lower boundary of the Low Average range (i.e., 9th percentile). His educational and occupational background would suggest a Low Average baseline.  His test scores were corrected to reflect norms for his [name] and, whenever possible, his gender and educational level (i.e., 12 years).   Attention & Executive Function His performances on measures of processing speed that required transcribing symbols to match digits using a key (Wechsler Adult Intelligence Scale-IV (WAIS-IV) Coding) or connecting numbers randomly arrayed on a page in numerical sequence (Trials A) fell within the Low Average range.    His performances on tests of attentional capacity/working memory that required mentally rearranging digits in reverse or ascending sequence (WAIS-IV Digit Span) or immediately recognizing symbols in left to right order (Wechsler Memory  Scale-IV (WMS-IV) Symbol Span) fell at the lower boundary of the Low  Average range.   His speed to complete a test that required complex visual sequencing involving mental shifting (Trails B) fell at the lower boundary of the Average range. Of note, he did not deviate from the alternating and ascending sequence of numbers to letters.     Measures of verbal productivity that required naming members of a category Geologist, engineering Test) or generating words to designated letters (Controlled Oral Word Association Test) fell within the Borderline range.   He performed within the Average range on a test of nonverbal problem-solving and conceptual flexibility that required inferring logical ways to sort geometric designs on the basis of ongoing feedback (Modified [employer]).  Learning & Memory On the WMS-IV, his Immediate Memory Index (IMI), a composite measure of his ability to recall verbal and visual information immediately after the stimuli was presented, fell at the lower boundary of the Average range. All of his immediate memory subtest scores were within the Average range. His Delayed Memory Index (DMI), a combined measure of his ability to recall verbal and visual information after a 20 to 30 minute delay, fell within the Low Average range. His DMI was as expected given his IMI, which indicated normal retention of successfully learned information.   Language His ability to name drawings of objects to confrontation [facility] Naming Test) fell at the lower boundary of the Low Average range. As noted above, measures of phonemic fluency (Controlled Oral Word Association Test) and semantic fluency (Animal Naming Test) fell within the  Borderline range. His fund of word knowledge (WAIS-IV Vocabulary) fell at lower boundary of the Low Average range. A measure of his word reading skill (Wide Range Achievement Test-4) fell within the Borderline range and was commensurate with a 2nd to 3rd grade  level. For example, he could not read words such as cliff, wrap or plot. His performance on a measure of verbal reasoning (WAIS-IV Similarities) that required classification of ostensibly different objects or ideas to a shared category was within the Borderline range.   Visual-Spatial Organization & Visual-Construction  There were no signs of spatial inattention or problems with visual recognition. His ability to assemble two-dimensional block designs from models (WAIS-IV Block Design) fell within the Low Average range. His drawing of a spatially-complex geometric design (Rey Complex Figure) was within acceptable limits.  Emotional Status His score of 1/15 on the Geriatric Depression Scale (short form) was not suggestive of depression. The only item he endorsed reflected his report of often feeling bored.     Summary & Conclusions [name] [name] is an 82 year-old man with a family history of Alzheimer's disease. He was not aware of having any cognitive difficulties though his wife reported that he has seemed more forgetful than usual over the past five to six months. No problems with mood or psychosocial adjustment were reported. There was no report of any recent changes in his social comportment, personality or habits. He has continued to perform his basic and instrumental activities of daily living without difficulty.  Neuropsychological evaluation identified a weakness for verbal ability as measures of verbal fluency (both phonemic and semantic), word reading and abstract verbal reasoning fell within the Borderline/Mildly Impaired range. Measures of vocabulary, confrontational naming ability and working memory capacity fell at the lower boundary of the Low Average range. He performed within normal expectations (i.e., Low Average to Average ranges) on measures of processing speed, visual sequencing, mental tracking/set shifting, memory (acquisition and storage), visual-construction and novel  problem-solving.   In conclusion, his well-preserved processing speed, auditory and visual memory, and nonverbal executive functioning would suggest a low probability of a neurodegenerative disorder. 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 While his subnormal scores on measures of verbal fluency and abstract verbal reasoning could be concerning for brain dysfunction, in his case they are believed to reflect his inherently limited fund of lexical knowledge.   Diagnostic Impression Complaints of memory loss [R41.3]  Recommendation The current test results have value as a baseline of his neurocognitive functioning. Should there be any concerns about his cognitive functioning in the future, then a repeat neuropsychological evaluation should be considered as a means to track his neurocognitive functioning.  The conclusions from this evaluation were discussed with [name] [name] and his wife on 09/22/15.   I have appreciated the opportunity to evaluate [name] [name]. Please feel free to contact me with any comments or questions.      ______________________ [name] [name], [name] [name] Licensed Psychologist   Copies to: [name] Age, MD [facility]      [name] Roers, MD   [facility]        ADDENDUM-NEUROPSYCHOLOGICAL TEST RESULTS  Animal Naming Test Score= 8 2nd  (adjusted for [name], gender and educational level)   [place] Naming Test Score= [phone] 9th (adjusted for [name], gender and educational level)   Controlled Oral Word Association Test Score=  15 words/0 repetition  4th (adjusted for [name], gender and educational  level)   Modified [employer]  Categories correct     4    21st (adjusted for [name] and education)  Perseverative errors    5       38th               Total errors   70      38th           Executive function composite  91     27th           Rey Complex Figure: copy       Score= 32/26  Within acceptable limits   Trails A Score=  56s  0e 16th (adjusted for [name], gender and educational level)  Trails B Score=  162s 0e 25th (adjusted for [name], gender and educational level)   Wechsler Adult Intelligence Scale-IV Subtest Scaled Score Percentile  Block Design    7 16th     Similarities    4   2nd      Digit Span  Forward             Backward             Sequenc.    _0 5th   16th     9th        9th          Vocabulary    6   9th   Coding      7  16th         Wechsler Memory Scale-IV Older Adult Battery Index Index Score Percentile  Immediate Memory  90 25th       Auditory Memory  90 25th       Visual Memory  92  30th       Delayed Memory  89   23rd      Symbol Span subtest  Scaled score=6    9th        Wide Range Achievement Test-4 Subtest  Raw score Standard score Percentile  Word Reading 28/70 71 3rd

## 2015-09-24 ENCOUNTER — Encounter: Payer: Self-pay | Admitting: Psychology

## 2015-11-28 ENCOUNTER — Ambulatory Visit (INDEPENDENT_AMBULATORY_CARE_PROVIDER_SITE_OTHER): Payer: Medicare Other | Admitting: Podiatry

## 2015-11-28 ENCOUNTER — Encounter: Payer: Self-pay | Admitting: Podiatry

## 2015-11-28 VITALS — BP 163/76 | HR 58

## 2015-11-28 DIAGNOSIS — B351 Tinea unguium: Secondary | ICD-10-CM | POA: Diagnosis not present

## 2015-11-28 DIAGNOSIS — M79606 Pain in leg, unspecified: Secondary | ICD-10-CM | POA: Diagnosis not present

## 2015-11-28 NOTE — Progress Notes (Signed)
Subjective:  82 year old male presents requesting toe nails trimmed. Thick long nails hurt in shoes.   Objective: Hypertrophic deformed yellow nails x 10.  No other acute problems noted.  Neurovascular status are within normal. No gross deformities in osseous structures bilateral.   Assessment: Onychomycosis x 10.  Painful feet.   Plan: Debrided all nails. Return as needed

## 2015-11-28 NOTE — Patient Instructions (Signed)
Seen for hypertrophic nails. All nails debrided. Return in 3 months or as needed.  

## 2015-11-30 ENCOUNTER — Encounter: Payer: Self-pay | Admitting: Neurology

## 2015-11-30 ENCOUNTER — Ambulatory Visit (INDEPENDENT_AMBULATORY_CARE_PROVIDER_SITE_OTHER): Payer: Medicare Other | Admitting: Neurology

## 2015-11-30 VITALS — BP 138/78 | HR 60 | Resp 18 | Ht 65.0 in | Wt 270.0 lb

## 2015-11-30 DIAGNOSIS — R413 Other amnesia: Secondary | ICD-10-CM

## 2015-11-30 DIAGNOSIS — Z820 Family history of epilepsy and other diseases of the nervous system: Secondary | ICD-10-CM | POA: Diagnosis not present

## 2015-11-30 NOTE — Progress Notes (Signed)
Subjective:    Patient ID: [name] [name] is a 82 y.o. male.  HPI     Interim history:   [name] [name] is an 82 year old right-handed gentleman with an underlying complex medical history of coronary artery disease, status post CABG 5, hypothyroidism, carotid artery disease, s/p L CEA in 7/04 (sees Dr. Einar Gip), type 2 diabetes, gout, chronic pain, s/p lumbar laminectomy in 10/09, (prior surgeries in '63, and '84, last one in 2014), obstructive sleep apnea, on CPAP for many years, mood disorder, heart disease, multiple myeloma, hypertension, arthritis, s/p L TKA (under Dr. Maureen Ralphs 6/06), and vitamin D deficiency, and obesity, who presents for follow-up consultation of his memory loss. The patient is accompanied by his wife today. I first met him on 06/01/2015 at the request of his primary care physician, at which time he reported short-term memory issues with forgetfulness and particular concern for Alzheimer's disease because of a family history. His MMSE was 26 out of 30 at the time. I suggested further workup in the form of pain MRI and full neuropsychological testing. He had a brain MRI without contrast on 06/12/2015 which I reviewed:  IMPRESSION:  This MRI of the brain without contrast shows the following:  1.    Mild cortical atrophy, within expected limits for age. 2.    T2/FLAIR hyperintense foci in the white matter of both hemispheres consistent with age-related chronic microvascular ischemic change. The extent is more than expected for age.  3.    There are no acute findings.   We called him with his test results.   He had an appointment with Dr. Valentina Shaggy on 09/13/2015 and a follow-up appointment on 09/22/2015. I reviewed his report:  Diagnostic Impression Complaints of memory loss [R41.3]   Recommendation The current test results have value as a baseline of his neurocognitive functioning. Should there be any concerns about his cognitive functioning in the future, then a repeat  neuropsychological evaluation should be considered as a means to track his neurocognitive functioning.   The conclusions from this evaluation were discussed with [name] [name] and his wife on 09/22/15.   Today, 11/30/2015: He reports doing okay, no new issues, compliant with meds and CPAP. He tries to stay active. He has not noticed any new memory issues. He has right knee pain, will be seeing Dr. Maureen Ralphs, who did his L TKA over 10 years ago. I reviewed his memory scores with him and also his MRI scan and also reviewed some of the images on the computer screen with the patient and his wife.  Previously:   06/01/2015: He reports short-term memory loss. He has forgetfulness and sometimes misplaces things but overall is primarily worried about Alzheimer's disease because he lost one sister to Alzheimer's disease, she passed away at age 56 and he has an 5 year old brother who has Alzheimer's disease and is now in a nursing home. His other sister, age 70 had an intracranial hemorrhage but is doing better now. He is the youngest of 4 siblings. His parents had twins after he was born but they did not survive. He had blood work recently through your office. His A1c was below 7. I reviewed test results they brought in today. He has also recently seen his cardiologist, Dr. Einar Gip. He has one grown daughter, one granddaughter. He lives with his wife of 25 years. He quit smoking over 30 years ago and does not drink alcohol. 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 I reviewed your office note  from 05/18/2015, which you kindly included. He stays active physically. He has been overweight for a long time. He knows he needs to eat better. He admits that he does not drink enough water.  His Past Medical History Is Significant For: Past Medical History:  Diagnosis Date  . Complication of anesthesia    Pt difficult to intubate; 07/19/03 aborted surgery unable to intubate  . Diabetes mellitus without  complication ([place])   . Dysthymic disorder   . Gout    Hx: of in left wrist  . Hearing deficit    Hx: of wears hearing aids  . Hypertension   . Kidney stones    Hx: of  . Multiple myeloma ([place])   . Occlusion and stenosis of unspecified carotid artery   . Pathologic fracture of vertebrae   . Pneumonia   . Rosacea   . Sleep apnea    Hx: of pt wears CPAP    His Past Surgical History Is Significant For: Past Surgical History:  Procedure Laterality Date  . APPENDECTOMY    . BACK SURGERY  [phone]   lumb  . CARDIAC CATHETERIZATION     Hx: of 04/05/10  . CAROTID ARTERY - SUBCLAVIAN ARTERY BYPASS GRAFT  2004   Hx: of-left  . CARPAL TUNNEL RELEASE Right 04/13/2013   Procedure: RIGHT CARPAL TUNNEL RELEASE;  Surgeon: Cammie Sickle., MD;  Location: [facility];  Service: Orthopedics;  Laterality: Right;  . CATARACT EXTRACTION     Hx: of left eye only  . COLONOSCOPY    . COLONOSCOPY W/ BIOPSIES AND POLYPECTOMY     Hx: of  . CORONARY ARTERY BYPASS GRAFT  2012   Hx: of 5  . ELBOW SURGERY  1992   Hx: of-lt  . HERNIA REPAIR  1963   Hx: of right  . JOINT REPLACEMENT  2006   Hx: of left knee  . LUMBAR LAMINECTOMY/DECOMPRESSION MICRODISCECTOMY N/A 11/18/2012   Procedure: REVISION L2 - L4 DECOMPRESSION DISCECTOMY 2 LEVELS;  Surgeon: Melina Schools, MD;  Location: [facility];  Service: Orthopedics;  Laterality: N/A;  . ROTATOR CUFF REPAIR  2003   Hx: of right  . TONSILLECTOMY    . TRIGGER FINGER RELEASE Right 04/13/2013   Procedure: RIGHT THUMB A-1 PULLEY RELEASE;  Surgeon: Cammie Sickle., MD;  Location: [facility];  Service: Orthopedics;  Laterality: Right;    His Family History Is Significant For: Family History  Problem Relation Age of Onset  . Cancer - Colon Mother   . Heart disease Father   . Alzheimer's disease Brother   . Dementia Other   . Alzheimer's disease Sister   . Alzheimer's disease Brother     His Social History Is Significant  For: Social History   Social History  . Marital status: Married    Spouse name: N/A  . Number of children: 1  . Years of education: college   Occupational History  . Retired     Social History Main Topics  . Smoking status: Former Smoker    Types: Cigarettes    Quit date: 04/08/1978  . Smokeless tobacco: Never Used     Comment: Quit smoking cigarretes in 1980  . Alcohol use No  . Drug use: No  . Sexual activity: Not Asked   Other Topics Concern  . None   Social History Narrative   Rare caffeine use     His Allergies Are:  Allergies  Allergen Reactions  . Ciprofloxacin   .  Hydrocodone Nausea Only    Ok taking in low doses(31m), has nausea with higher doses  . Levofloxacin Other (See Comments)    Pain/cramping   . Oxycodone Nausea Only  . Tramadol Nausea Only  . Cardizem [Diltiazem Hcl] Rash  :   His Current Medications Are:  Outpatient Encounter Prescriptions as of 11/30/2015  Medication Sig  . allopurinol (ZYLOPRIM) 300 MG tablet Take 300 mg by mouth daily.   .Marland Kitchenaspirin EC 81 MG tablet Take 81 mg by mouth daily.  . bisoprolol (ZEBETA) 10 MG tablet Take 10 mg by mouth every morning.   . Calcium Carbonate-Vitamin D (CALCIUM + D PO) Take 1,200 mg by mouth daily.  . fexofenadine (ALLEGRA) 180 MG tablet Take 180 mg by mouth daily as needed (for allergies).   .Marland Kitchenlevothyroxine (SYNTHROID, LEVOTHROID) 100 MCG tablet Take 100 mcg by mouth daily before breakfast.  . lisinopril-hydrochlorothiazide (PRINZIDE,ZESTORETIC) 10-12.5 MG per tablet Take 1 tablet by mouth every morning.   . Multiple Vitamin (MULTIVITAMIN) tablet Take 1 tablet by mouth daily.  .Marland KitchenNITROSTAT 0.4 MG SL tablet   . Polyethyl Glycol-Propyl Glycol (SYSTANE OP) Apply 1 drop to eye 2 (two) times daily. Each eye  . rosuvastatin (CRESTOR) 20 MG tablet Take 20 mg by mouth at bedtime.  . Vitamin D, Ergocalciferol, (DRISDOL) 50000 UNITS CAPS Take 50,000 Units by mouth every 7 (seven) days.   . [DISCONTINUED] LOVAZA  1 G capsule Take 1 g by mouth 3 (three) times daily.    No facility-administered encounter medications on file as of 11/30/2015.   :  Review of Systems:  Out of a complete 14 point review of systems, all are reviewed and negative with the exception of these symptoms as listed below: Review of Systems  Neurological:       Patient is here for f/u. They would like to go over MRI. Recently had testing with Dr. ZValentina Shaggy No new concerns.     Objective:  Neurologic Exam  Physical Exam Physical Examination:   Vitals:   11/30/15 1326  BP: 138/78  Pulse: 60  Resp: 18   General Examination: The patient is a very pleasant 82y.o. male in no acute distress. He appears well-developed and well-nourished and well groomed. He is obese. He is in good spirits today.  HEENT: Normocephalic, atraumatic, pupils are equal, round and reactive to light and accommodation. Hearing is impaired bilaterally. He has a set of hearing aids but does not use them according to his wife. He is status post bilateral cataract repairs. Face is symmetric with normal facial animation and normal facial sensation. Speech is clear with no dysarthria noted. There is no hypophonia. There is no lip, neck/head, jaw or voice tremor. Neck is supple with full range of passive and active motion. There are no carotid bruits on auscultation. Unremarkable scar from left carotid endarterectomy. Oropharynx exam reveals mild mouth dryness and moderate airway crowding. He has a large neck of 20-3/4 inches circumference. Tonsils are absent.  Chest: Clear to auscultation without wheezing, rhonchi or crackles noted.  Heart: S1+S2+0, regular and normal without murmurs, rubs or gallops noted.   Abdomen: Soft, non-tender and non-distended with normal bowel sounds appreciated on auscultation.  Extremities: There is 1+ pitting edema in the distal lower extremities bilaterally. Pedal pulses are intact.  Skin: Warm and dry without trophic changes  noted. There are no varicose veins.  Musculoskeletal: exam reveals no obvious joint deformities, tenderness or joint swelling or erythema, except R knee pain.  Neurologically:  Mental status: The patient is awake, alert and oriented in all 4 spheres. His immediate and remote memory, attention, language skills and fund of knowledge are appropriate. There is no evidence of aphasia, agnosia, apraxia or anomia. Speech is clear with normal prosody and enunciation. Thought process is linear. Mood is normal and affect is normal.   On 06/01/2015: MMSE 26/30, CDT: 3-4/4, AFT: 12/min, GDS: 1/15.   On 11/30/2015: MMSE: 25/30, CDT: 4/4, AFT: 13/min.   Cranial nerves II - XII are as described above under HEENT exam. In addition: shoulder shrug is normal with equal shoulder height noted. Motor exam: Normal bulk, strength and tone is noted. There is no drift, tremor or rebound. Romberg is negative. Reflexes are 1+ in the upper extremities, trace in lower extremities. Fine motor skills are mildly impaired globally. He stands up with mild difficulty, mostly because of body habitus. He stands slightly wide-based. He walks without problems. He turns without issues, balance is age-appropriate. Sensory exam is intact to all modalities in the upper and lower extremity is with the exception of decrease vibratory sense distally in the lower extremities, up to mid shin areas, not unusual for age.   Assessment and Plan:   In summary, HANZEL PIZZO is a very pleasant 82 year old male with an underlying complex medical history of coronary artery disease, status post CABG 5, hypothyroidism, carotid artery disease, s/p L CEA in 7/04, type 2 diabetes, gout, chronic pain, s/p lumbar laminectomy in 10/09, (prior surgeries in '63, and '84, last one in 2014), obstructive sleep apnea, on CPAP for many years, mood disorder, heart disease, multiple myeloma, hypertension, arthritis, s/p L TKA, and vitamin D deficiency, who Presents for  follow-up consultation of his memory loss. He has a family history of Alzheimer's disease but also has vascular risk factors himself. His memory scores are stable and exam is stable. As far as workup, he had a brain MRI without contrast and we discussed the results.  I also reviewed some of the images on the computer screen with them. He had neuropsychological evaluation in August 2017 with fairly reassuring findings, diagnostic impression of complaints of memory loss.  We talked about maintaining a healthy lifestyle in general and staying active mentally and physically. he is advised not to climb any ladders. I encouraged the patient to eat healthy, exercise daily and keep well hydrated, to keep a scheduled bedtime and wake time routine, to not skip any meals and eat healthy snacks in between meals and to have protein with every meal. I stressed the importance of regular exercise, within of course the patient's own mobility limitations. I encouraged the patient to keep up with current events by reading the news paper or watching the news and to do word puzzles, or if feasible, to go on [email or web address].   At this juncture, I suggested a 6 month follow up and we will continue to monitor his exam and his memory scores.  I answered all their questions today and the patient and his wife were in agreement.

## 2015-11-30 NOTE — Patient Instructions (Signed)
We will monitor your memory scores and your exam. You are stable at this time and brain MRI and cognitive test results were mostly in keeping with age related changes.   Memory loss or changes in cognitive function can have many reasons and does not always mean you have dementia. Conditions that can contribute to subjective or objective memory loss include: depression, stress, poor sleep from insomnia or sleep apnea, dehydration, fluctuation in blood sugar values, thyroid or electrolyte dysfunction and certain vitamin deficiencies. Dementia can be caused by stroke, brain atherosclerosis or brain vascular disease due to vascular risk factors (smoking, high blood pressure, high cholesterol, obesity and uncontrolled diabetes), certain degenerative brain disorders (including Parkinson's disease and Multiple sclerosis) and by Alzheimer's disease or other, more rare and sometimes hereditary causes.   Your memory loss is rather mild at this point, which, of course is reassuring.

## 2015-12-05 ENCOUNTER — Ambulatory Visit: Payer: Medicare Other | Admitting: Neurology

## 2016-04-30 ENCOUNTER — Ambulatory Visit: Payer: Medicare Other | Admitting: Podiatry

## 2016-05-08 ENCOUNTER — Ambulatory Visit (INDEPENDENT_AMBULATORY_CARE_PROVIDER_SITE_OTHER): Payer: Medicare Other | Admitting: Podiatry

## 2016-05-08 ENCOUNTER — Encounter: Payer: Self-pay | Admitting: Podiatry

## 2016-05-08 DIAGNOSIS — B351 Tinea unguium: Secondary | ICD-10-CM | POA: Diagnosis not present

## 2016-05-08 DIAGNOSIS — M79606 Pain in leg, unspecified: Secondary | ICD-10-CM

## 2016-05-08 DIAGNOSIS — M79671 Pain in right foot: Secondary | ICD-10-CM

## 2016-05-08 DIAGNOSIS — M79672 Pain in left foot: Secondary | ICD-10-CM

## 2016-05-08 NOTE — Progress Notes (Signed)
Subjective:  83 year old male presents requesting toe nails trimmed. Thick long nails hurt in shoes.   Objective: Hypertrophic deformed yellow nails x 10.  Digital corn at distal end 3rd left, pinch callus right hallux. No other acute problems noted.  Neurovascular status are within normal. No gross deformities in osseous structures bilateral.   Assessment: Onychomycosis x 10.  Digital corn 3rd left. Painful feet.   Plan: Debrided all nails and calluses. Return as needed

## 2016-05-08 NOTE — Patient Instructions (Signed)
Seen for hypertrophic nails. All nails and calluses debrided. Return in 3 months or as needed.  

## 2016-05-29 ENCOUNTER — Ambulatory Visit (INDEPENDENT_AMBULATORY_CARE_PROVIDER_SITE_OTHER): Payer: Medicare Other | Admitting: Nurse Practitioner

## 2016-05-29 ENCOUNTER — Encounter: Payer: Self-pay | Admitting: Nurse Practitioner

## 2016-05-29 DIAGNOSIS — G4733 Obstructive sleep apnea (adult) (pediatric): Secondary | ICD-10-CM

## 2016-05-29 DIAGNOSIS — Z9989 Dependence on other enabling machines and devices: Secondary | ICD-10-CM

## 2016-05-29 DIAGNOSIS — R413 Other amnesia: Secondary | ICD-10-CM | POA: Diagnosis not present

## 2016-05-29 NOTE — Progress Notes (Addendum)
[facility]  PATIENT: HONOR FAIRBANK DOB: 07/22/1932   REASON FOR VISIT: Follow-up for memory loss  HISTORY FROM: Patient and wife    HISTORY OF PRESENT ILLNESS:Mr. Brancato is an 83 year old right-handed gentleman with an underlyingcomplex medical history of coronary artery disease, status post CABG 5, hypothyroidism, carotid artery disease, s/p L CEA in 7/04 (sees Dr. Einar Gip), type 2 diabetes, gout, chronic pain, s/p lumbar laminectomy in 10/09, (prior surgeries in '63, and '84, last one in 2014), obstructive sleep apnea, on CPAP for many years, mood disorder, heart disease, multiple myeloma, hypertension, arthritis, s/p L TKA (under Dr. Maureen Ralphs 6/06), and vitamin D deficiency, and obesity, who presents for follow-up consultation of his memory loss. The patient is accompanied by his wife today. I first met him on 06/01/2015 at the request of his primary care physician, at which time he reported short-term memory issues with forgetfulness and particular concern for Alzheimer's disease because of a family history. His MMSE was 26 out of 30 at the time. I suggested further workup in the form of pain MRI and full neuropsychological testing. He had a brain MRI without contrast on 06/12/2015 which I reviewed:  IMPRESSION: This MRI of the brain without contrast shows the following:  1. Mild cortical atrophy, within expected limits for age. 2. T2/FLAIR hyperintense foci in the white matter of both hemispheres consistent with age-related chronic microvascular ischemic change. The extent is more than expected for age.  3. There are no acute findings.   We called him with his test results.   He had an appointment with Dr. Valentina Shaggy on 09/13/2015 and a follow-up appointment on 09/22/2015. I reviewed his report:  Diagnostic Impression Complaints of memory loss [R41.3]  Recommendation The current test results have value as a baseline of his neurocognitive functioning.  Should there be any concerns about his cognitive functioning in the future, then a repeat neuropsychological evaluation should be considered as a means to track his neurocognitive functioning.  The conclusions from this evaluation were discussed with Mr. Beckford and his wife on 09/22/15.  Today, 11/30/2015: He reports doing okay, no new issues, compliant with meds and CPAP. He tries to stay active. He has not noticed any new memory issues. He has right knee pain, will be seeing Dr. Maureen Ralphs, who did his L TKA over 10 years ago. I reviewed his memory scores with him and also his MRI scan and also reviewed some of the images on the computer screen with the patient and his wife. UPDATE 05/29/16 CM Mr. Baldree, 83 year old male returns for follow-up for memory loss.He reports that he has no new issues. He is compliant with CPAP. He feels his memory is about the same. He remains active. He does have some knee pain and has been followed by Dr. Maureen Ralphs. He denies any recent falls. He returns for reevaluation   REVIEW OF SYSTEMS: Full 14 system review of systems performed and notable only for those listed, all others are neg:  Constitutional: neg  Cardiovascular: neg Ear/Nose/Throat: neg  Skin: neg Eyes: neg Respiratory: neg Gastroitestinal: neg  Hematology/Lymphatic: neg  Endocrine: neg Musculoskeletal: Knee pain  Allergy/Immunology: Environmental allergies Neurological: neg Psychiatric: neg Sleep : neg   ALLERGIES: Allergies  Allergen Reactions  . Ciprofloxacin   . Hydrocodone Nausea Only    Ok taking in low doses('5mg'$ ), has nausea with higher doses  . Levofloxacin Other (See Comments)    Pain/cramping   . Oxycodone Nausea Only  . Tramadol Nausea Only  .  Cardizem [Diltiazem Hcl] Rash    HOME MEDICATIONS: Outpatient Medications Prior to Visit  Medication Sig Dispense Refill  . allopurinol (ZYLOPRIM) 300 MG tablet Take 300 mg by mouth daily.     [name] aspirin EC 81 MG tablet Take 81 mg by  mouth daily.    . bisoprolol (ZEBETA) 10 MG tablet Take 10 mg by mouth every morning.     . Calcium Carbonate-Vitamin D (CALCIUM + D PO) Take 1,200 mg by mouth daily.    . fexofenadine (ALLEGRA) 180 MG tablet Take 180 mg by mouth daily as needed (for allergies).     [name] levothyroxine (SYNTHROID, LEVOTHROID) 100 MCG tablet Take 100 mcg by mouth daily before breakfast.    . lisinopril-hydrochlorothiazide (PRINZIDE,ZESTORETIC) 10-12.5 MG per tablet Take 1 tablet by mouth every morning.     . Multiple Vitamin (MULTIVITAMIN) tablet Take 1 tablet by mouth daily.    [name] NITROSTAT 0.4 MG SL tablet     . Polyethyl Glycol-Propyl Glycol (SYSTANE OP) Apply 1 drop to eye 2 (two) times daily. Each eye    . rosuvastatin (CRESTOR) 20 MG tablet Take 20 mg by mouth at bedtime.    . Vitamin D, Ergocalciferol, (DRISDOL) 50000 UNITS CAPS Take 50,000 Units by mouth every 7 (seven) days.      No facility-administered medications prior to visit.     PAST MEDICAL HISTORY: Past Medical History:  Diagnosis Date  . Complication of anesthesia    Pt difficult to intubate; 07/19/03 aborted surgery unable to intubate  . Diabetes mellitus without complication (HCC)   . Dysthymic disorder   . Gout    Hx: of in left wrist  . Hearing deficit    Hx: of wears hearing aids  . Hypertension   . Kidney stones    Hx: of  . Multiple myeloma (HCC)   . Occlusion and stenosis of unspecified carotid artery   . Pathologic fracture of vertebrae   . Pneumonia   . Rosacea   . Sleep apnea    Hx: of pt wears CPAP    PAST SURGICAL HISTORY: Past Surgical History:  Procedure Laterality Date  . APPENDECTOMY    . BACK SURGERY  [phone]   lumb  . CARDIAC CATHETERIZATION     Hx: of 04/05/10  . CAROTID ARTERY - SUBCLAVIAN ARTERY BYPASS GRAFT  2004   Hx: of-left  . CARPAL TUNNEL RELEASE Right 04/13/2013   Procedure: RIGHT CARPAL TUNNEL RELEASE;  Surgeon: Wyn Forster., MD;  Location: [facility];  Service:  Orthopedics;  Laterality: Right;  . CATARACT EXTRACTION     Hx: of left eye only  . COLONOSCOPY    . COLONOSCOPY W/ BIOPSIES AND POLYPECTOMY     Hx: of  . CORONARY ARTERY BYPASS GRAFT  2012   Hx: of 5  . ELBOW SURGERY  1992   Hx: of-lt  . HERNIA REPAIR  1963   Hx: of right  . JOINT REPLACEMENT  2006   Hx: of left knee  . LUMBAR LAMINECTOMY/DECOMPRESSION MICRODISCECTOMY N/A 11/18/2012   Procedure: REVISION L2 - L4 DECOMPRESSION DISCECTOMY 2 LEVELS;  Surgeon: Venita Lick, MD;  Location: [facility];  Service: Orthopedics;  Laterality: N/A;  . ROTATOR CUFF REPAIR  2003   Hx: of right  . TONSILLECTOMY    . TRIGGER FINGER RELEASE Right 04/13/2013   Procedure: RIGHT THUMB A-1 PULLEY RELEASE;  Surgeon: Wyn Forster., MD;  Location: [facility];  Service: Orthopedics;  Laterality:  Right;    FAMILY HISTORY: Family History  Problem Relation Age of Onset  . Cancer - Colon Mother   . Heart disease Father   . Alzheimer's disease Brother   . Dementia Other   . Alzheimer's disease Sister   . Alzheimer's disease Brother     SOCIAL HISTORY: Social History   Social History  . Marital status: Married    Spouse name: N/A  . Number of children: 1  . Years of education: college   Occupational History  . Retired     Social History Main Topics  . Smoking status: Former Smoker    Types: Cigarettes    Quit date: 04/08/1978  . Smokeless tobacco: Never Used     Comment: Quit smoking cigarretes in 1980  . Alcohol use No  . Drug use: No  . Sexual activity: Not on file   Other Topics Concern  . Not on file   Social History Narrative   Rare caffeine use      PHYSICAL EXAM  Vitals:   05/29/16 1430  BP: (!) 144/64  Pulse: 60  Weight: 270 lb 9.6 oz (122.7 kg)  Height: '5\' 5"'$  (1.651 m)   Body mass index is 45.03 kg/m.  Generalized: Well developed, Obese male in no acute distress , well-groomed Head: normocephalic and atraumatic,. Oropharynx benign  Neck: Supple,  no carotid bruit Musculoskeletal: No deformity   Neurological examination   Mentation: Alert oriented to time, place, history taking. Attention span and concentration appropriate. Recent and remote memory intact.  Follows all commands speech and language fluent.   Cranial nerve II-XII: Pupils were equal round reactive to light extraocular movements were full, visual field were full on confrontational test. Facial sensation and strength were normal. hearing was intact to finger rubbing bilaterally. Uvula tongue midline. head turning and shoulder shrug were normal and symmetric.Tongue protrusion into cheek strength was normal. Motor: normal bulk and tone, full strength in the BUE, BLE, fine finger movements normal, no pronator drift.  Sensory: normal and symmetric to light touch, pinprick, and  Vibration, in the upper and lower extremities Coordination: finger-nose-finger, heel-to-shin bilaterally, no dysmetria Reflexes: Symmetric upper and lower, plantar responses were flexor bilaterally. Gait and Station: Rising up from seated position without assistance, wide based  stance,  ambulates without difficulty  DIAGNOSTIC DATA (LABS, IMAGING, TESTING) - I reviewed patient records, labs, notes, testing and imaging myself where available.  Lab Results  Component Value Date   WBC 9.9 11/19/2012   HGB 15.7 04/13/2013   HCT 37.3 (L) 11/19/2012   MCV 95.2 11/19/2012   PLT 135 (L) 11/19/2012      Component Value Date/Time   NA 142 04/08/2013 1150   NA 142 06/09/2012 0944   K 4.1 04/08/2013 1150   K 4.1 06/09/2012 0944   CL 101 04/08/2013 1150   CL 105 06/09/2012 0944   CO2 25 04/08/2013 1150   CO2 27 06/09/2012 0944   GLUCOSE 118 (H) 04/08/2013 1150   GLUCOSE 148 (H) 06/09/2012 0944   BUN 29 (H) 04/08/2013 1150   BUN 24.1 06/09/2012 0944   CREATININE 0.90 04/08/2013 1150   CREATININE 1.1 06/09/2012 0944   CALCIUM 9.8 04/08/2013 1150   CALCIUM 9.3 06/09/2012 0944   PROT 7.0 06/09/2012  0944   ALBUMIN 3.4 (L) 06/09/2012 0944   AST 20 06/09/2012 0944   ALT 20 06/09/2012 0944   ALKPHOS 50 06/09/2012 0944   BILITOT 0.91 06/09/2012 0944   GFRNONAA 79 (L) 04/08/2013 1150  GFRAA >90 04/08/2013 1150     ASSESSMENT AND PLAN 83 year old with underlying complex medical history of coronary artery disease, status post CABG 5, hypothyroidism, carotid artery disease, s/p L CEA in 7/04, type 2 diabetes, gout, chronic pain, s/p lumbar laminectomy in 10/09, (prior surgeries in '63, and '84, last one in 2014), obstructive sleep apnea, on CPAP for many years, mood disorder, heart disease, multiple myeloma, hypertension, arthritis, s/p L TKA, and vitamin D deficiency, who Presents for follow-up consultation of his memory loss. He has a family history of Alzheimer's disease but also has vascular risk factors himself  Memory score is stable  Will continue to monitor every 6 months Follow up in 6 months with Dr. Rexene Alberts A total of 25 min was spent face-to-face with this patient. Over half this time was spent on counseling patient on the memory loss diagnosis and different diagnostic and therapeutic options available. Planning for further management options discussed with the patient. Discussed compensatory mechanisms to compensate such as a daily planner structured environment etc. Dennie Bible, [facility], [facility], [facility] [address], [place],  [id number] [phone]  I reviewed the above note and documentation by the Nurse Practitioner and agree with the history, physical exam, assessment and plan as outlined above. I was immediately available for face-to-face consultation. Star Age, MD, PhD [facility])

## 2016-05-29 NOTE — Patient Instructions (Signed)
Memory score is stable Will continue to monitor Follow up in 6 months

## 2016-10-08 ENCOUNTER — Telehealth: Payer: Self-pay | Admitting: Neurology

## 2016-10-08 NOTE — Telephone Encounter (Signed)
Appointment Request From: [name]    With Provider: Star Age, MD [[facility]]    Preferred Date Range: Any date 10/07/2016 or later    Preferred Times: Any    Reason: To address the following health maintenance concerns.  Tetanus/Tdap  Pna Vac Low Risk Adult  Influenza Vaccine    Comments:  cancel-reschedule for 1 yr follow up

## 2016-10-08 NOTE — Telephone Encounter (Signed)
I called pt to discuss his message. No answer, left a message asking him to call me back. If pt calls back, please find out if he needs to reschedule his appt with Dr. Rexene Alberts and assist him as able.

## 2016-10-09 ENCOUNTER — Telehealth: Payer: Self-pay

## 2016-10-09 NOTE — Telephone Encounter (Signed)
Pt called, requesting to speak to me, asked that I cancel his November appt with Dr. Rexene Alberts because his PCP is managing his memory and does not need this appt. Pt did not want to r/s at this time. Appt cancelled.

## 2016-12-03 ENCOUNTER — Ambulatory Visit: Payer: Medicare Other | Admitting: Neurology

## 2017-01-03 ENCOUNTER — Encounter: Payer: Self-pay | Admitting: Podiatry

## 2017-02-06 ENCOUNTER — Ambulatory Visit: Payer: Medicare Other | Admitting: Podiatry

## 2017-02-06 ENCOUNTER — Encounter: Payer: Self-pay | Admitting: Podiatry

## 2017-02-06 DIAGNOSIS — B351 Tinea unguium: Secondary | ICD-10-CM

## 2017-02-06 DIAGNOSIS — M79606 Pain in leg, unspecified: Secondary | ICD-10-CM

## 2017-02-06 NOTE — Progress Notes (Signed)
Subjective: 84 y.o. year old male patient presents complaining of painful nails. Patient requests toe nails trimmed.   Objective: Dermatologic: Thick yellow deformed nails with fungal debris under nail plate x 10. Distal clivi distal end 3rd toe left. Plantar medial callus right hallux. Vascular: Pedal pulses are all palpable. Orthopedic: No growth deformities. Neurologic: All epicritic and tactile sensations grossly intact.  Assessment: Dystrophic mycotic nails x 10. Digital clivi 3rd left. Painful feet.  Treatment: All mycotic nails, corns, calluses debrided.  Return in 3 months or as needed.

## 2017-02-06 NOTE — Patient Instructions (Signed)
Seen for hypertrophic nails. All nails debrided. Return in 3 months or as needed.  

## 2017-05-11 ENCOUNTER — Other Ambulatory Visit: Payer: Self-pay | Admitting: Oncology

## 2017-05-13 ENCOUNTER — Other Ambulatory Visit: Payer: Self-pay | Admitting: Oncology

## 2017-05-16 ENCOUNTER — Encounter: Payer: Self-pay | Admitting: Oncology

## 2017-06-03 ENCOUNTER — Ambulatory Visit: Payer: Medicare Other | Admitting: Podiatry

## 2017-06-03 ENCOUNTER — Encounter: Payer: Self-pay | Admitting: Podiatry

## 2017-06-03 DIAGNOSIS — B351 Tinea unguium: Secondary | ICD-10-CM | POA: Diagnosis not present

## 2017-06-03 DIAGNOSIS — M79606 Pain in leg, unspecified: Secondary | ICD-10-CM

## 2017-06-03 NOTE — Patient Instructions (Signed)
Seen for hypertrophic nails. All nails debrided. Return in 3 months or as needed.  

## 2017-06-03 NOTE — Progress Notes (Signed)
Subjective: 84 y.o. year old male patient presents complaining of painful nails. Patient requests toe nails trimmed.   Objective: Dermatologic: Thick yellow deformed nails x 10. Digital clavi 3rd left. Vascular: Pedal pulses are all palpable. Orthopedic: No gross deformities. Neurologic: All epicritic and tactile sensations grossly intact.  Assessment: Dystrophic mycotic nails x 10. Digital clavi 3rd left.  Treatment: All mycotic nails debrided.  Debrided digital clivi and Amerigel ointment dressing applied. Return in 3 months or as needed.

## 2017-08-29 ENCOUNTER — Other Ambulatory Visit: Payer: Self-pay | Admitting: *Deleted

## 2017-08-29 DIAGNOSIS — D472 Monoclonal gammopathy: Secondary | ICD-10-CM

## 2017-09-01 ENCOUNTER — Other Ambulatory Visit: Payer: Self-pay | Admitting: Oncology

## 2017-09-01 ENCOUNTER — Inpatient Hospital Stay: Payer: Medicare Other | Attending: Oncology

## 2017-09-01 DIAGNOSIS — D472 Monoclonal gammopathy: Secondary | ICD-10-CM | POA: Diagnosis not present

## 2017-09-01 LAB — CBC WITH DIFFERENTIAL (CANCER CENTER ONLY)
Basophils Absolute: 0 10*3/uL (ref 0.0–0.1)
Basophils Relative: 1 %
EOS PCT: 3 %
Eosinophils Absolute: 0.2 10*3/uL (ref 0.0–0.5)
HCT: 42.4 % (ref 38.4–49.9)
Hemoglobin: 14.4 g/dL (ref 13.0–17.1)
Lymphocytes Relative: 29 %
Lymphs Abs: 1.7 10*3/uL (ref 0.9–3.3)
MCH: 32.6 pg (ref 27.2–33.4)
MCHC: 34 g/dL (ref 32.0–36.0)
MCV: 95.9 fL (ref 79.3–98.0)
MONOS PCT: 8 %
Monocytes Absolute: 0.5 10*3/uL (ref 0.1–0.9)
Neutro Abs: 3.4 10*3/uL (ref 1.5–6.5)
Neutrophils Relative %: 59 %
PLATELETS: 149 10*3/uL (ref 140–400)
RBC: 4.43 MIL/uL (ref 4.20–5.82)
RDW: 14.7 % — AB (ref 11.0–14.6)
WBC Count: 5.8 10*3/uL (ref 4.0–10.3)

## 2017-09-01 LAB — CMP (CANCER CENTER ONLY)
ALBUMIN: 3.9 g/dL (ref 3.5–5.0)
ALK PHOS: 63 U/L (ref 38–126)
ALT: 20 U/L (ref 0–44)
AST: 21 U/L (ref 15–41)
Anion gap: 10 (ref 5–15)
BUN: 21 mg/dL (ref 8–23)
CALCIUM: 9.5 mg/dL (ref 8.9–10.3)
CO2: 25 mmol/L (ref 22–32)
Chloride: 104 mmol/L (ref 98–111)
Creatinine: 1.13 mg/dL (ref 0.61–1.24)
GFR, EST NON AFRICAN AMERICAN: 59 mL/min — AB (ref >60)
GFR, Est AFR Am: >60 mL/min (ref >60)
Glucose, Bld: 171 mg/dL — ABNORMAL HIGH (ref 70–99)
Potassium: 4.3 mmol/L (ref 3.5–5.1)
Sodium: 139 mmol/L (ref 135–145)
Total Bilirubin: 0.7 mg/dL (ref 0.3–1.2)
Total Protein: 7.6 g/dL (ref 6.5–8.1)

## 2017-09-02 LAB — PROTEIN ELECTROPHORESIS, SERUM
A/G Ratio: 1 (ref 0.7–1.7)
ALBUMIN ELP: 3.4 g/dL (ref 2.9–4.4)
Alpha-1-Globulin: 0.2 g/dL (ref 0.0–0.4)
Alpha-2-Globulin: 0.9 g/dL (ref 0.4–1.0)
Beta Globulin: 0.9 g/dL (ref 0.7–1.3)
Gamma Globulin: 1.4 g/dL (ref 0.4–1.8)
Globulin, Total: 3.4 g/dL (ref 2.2–3.9)
M-Spike, %: 0.8 g/dL — ABNORMAL HIGH
Total Protein ELP: 6.8 g/dL (ref 6.0–8.5)

## 2017-09-02 LAB — KAPPA/LAMBDA LIGHT CHAINS
Kappa free light chain: 35.4 mg/L — ABNORMAL HIGH (ref 3.3–19.4)
Kappa, lambda light chain ratio: 4.02 — ABNORMAL HIGH (ref 0.26–1.65)
Lambda free light chains: 8.8 mg/L (ref 5.7–26.3)

## 2017-09-02 LAB — BETA 2 MICROGLOBULIN, SERUM: BETA 2 MICROGLOBULIN: 2.9 mg/L — AB (ref 0.6–2.4)

## 2017-09-05 NOTE — Progress Notes (Signed)
[place]  Telephone:([phone] Fax:(336) [phone]     ID: [name] [name] DOB: 1932-10-15  MR#: [id number]  HRC#:[id number]  Patient Care Team: [name] Roers, MD as PCP - General (Family Medicine) [name] [place]ever, MD as Consulting Physician (Pulmonary Disease) [name] Kenner, MD (Dermatology) [name] [name], [name] Dad, MD as Consulting Physician (Oncology) [name] Prows, MD as Consulting Physician (Cardiology) [name] [name] OTHER MD:  CHIEF COMPLAINT: M-GUS  CURRENT TREATMENT: observation   HISTORY OF CURRENT ILLNESS: From the original intake note, 06/17/2011:   [name] [name] fell in 06/2009, and was evaluated in the emergency room with plain films of the lumbar spine, which showed no acute abnormality.  A week later he presented with intractable pain to Dr. Gladstone [name], and had to be admitted for further evaluation and for pain control.  Films on 07/02 showed a new compression deformity of T12.  There was no obvious retropulsion.  There was diffuse osteopenia, and a possible radiolucent lesion was noted in the left femur.  This was further evaluated on 07/05, and no radiolucent lesion was seen by CT scan.  The patient has had a fairly protracted course since that time.  He was at [facility] for a while.  He is now back home.  Uses a walker, and he gets around fairly well with that according to the wife.  Further evaluation is planned through Dr. Gladstone [name] and Dr. Melina [name].  As part of his workup in the hospital in early July, the patient had an immunofixation which showed slightly low IgA at 50, slightly low IgM at 34, and a normal IgG at 1400 with a monoclonal IgG kappa paraprotein.  The M-spike at that time, on 07/02, was 0.84 g/dL.  Urine immunofixation showed the IgG kappa, but no Bence-Jones proteins.  Free kappa light chains were 2.16, free lambda light chains 0.10. His subsequent history is as detailed below.  The patient's subsequent history is as detailed  below.  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   We had seen him approximately 5 years ago most recently and released him at that time to his primary care physician.  More recently he was having some right knee work and an abnormality was noted in the bones so he was referred back for further evaluation.    We repeated his basic work-up and his kappa lambda light chain was elevated at a ratio of 4.02 (previously within normal limits) but is M-spike was entirely unchanged at 0.8.   REVIEW OF SYSTEMS: [name] [name] reports that he feels fine. 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 He denies unusual headaches, visual changes, nausea, vomiting, or dizziness. There has been no unusual cough, phlegm production, or pleurisy. There's been no change in bowel or bladder habits. He denies unexplained fatigue or unexplained weight loss, bleeding, rash, or fever. A detailed review of systems was otherwise stable.    PAST MEDICAL HISTORY: Past Medical History:  Diagnosis Date  . Complication of anesthesia    Pt difficult to intubate; 07/19/03 aborted surgery unable to intubate  . Diabetes mellitus without complication ([place])   . Dysthymic disorder   . Gout    Hx: of in left wrist  . Hearing deficit    Hx: of wears hearing aids  . Hypertension   . Kidney stones    Hx: of  . Multiple myeloma ([place])   . Occlusion and stenosis of unspecified  carotid artery   . Pathologic fracture of vertebrae   . Pneumonia   . Rosacea   . Sleep apnea    Hx: of pt wears CPAP    PAST SURGICAL HISTORY: Past Surgical History:  Procedure Laterality Date  . APPENDECTOMY    . BACK SURGERY  [phone]   lumb  . CARDIAC CATHETERIZATION     Hx: of 04/05/10  . CAROTID ARTERY - SUBCLAVIAN ARTERY BYPASS GRAFT  2004   Hx: of-left  . CARPAL TUNNEL RELEASE Right 04/13/2013   Procedure: RIGHT CARPAL TUNNEL RELEASE;  Surgeon: Cammie Sickle.,  MD;  Location: [facility];  Service: Orthopedics;  Laterality: Right;  . CATARACT EXTRACTION     Hx: of left eye only  . COLONOSCOPY    . COLONOSCOPY W/ BIOPSIES AND POLYPECTOMY     Hx: of  . CORONARY ARTERY BYPASS GRAFT  2012   Hx: of 5  . ELBOW SURGERY  1992   Hx: of-lt  . HERNIA REPAIR  1963   Hx: of right  . JOINT REPLACEMENT  2006   Hx: of left knee  . LUMBAR LAMINECTOMY/DECOMPRESSION MICRODISCECTOMY N/A 11/18/2012   Procedure: REVISION L2 - L4 DECOMPRESSION DISCECTOMY 2 LEVELS;  Surgeon: [name] Schools, MD;  Location: [facility];  Service: Orthopedics;  Laterality: N/A;  . ROTATOR CUFF REPAIR  2003   Hx: of right  . TONSILLECTOMY    . TRIGGER FINGER RELEASE Right 04/13/2013   Procedure: RIGHT THUMB A-1 PULLEY RELEASE;  Surgeon: Cammie Sickle., MD;  Location: [facility];  Service: Orthopedics;  Laterality: Right;    FAMILY HISTORY Family History  Problem Relation Age of Onset  . Cancer - Colon Mother   . Heart disease Father   . Alzheimer's disease Brother   . Dementia Other   . Alzheimer's disease Sister   . Alzheimer's disease Brother    The patient's mother died of colon cancer at age 35. The patient's father died of a heart attack at age 22. The patient had 1 brother and 2 sisters. The patient's older brother and sister died of Alzheimer's. The patient's nephew (son of the brother who died of Alzheimer's) had a sinus cancer.   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 He has been married to his wife, [name] [name] for 37 years. He has one daughter, [name] [name] who [place] in [place] and works for the school system. He has 1 grandchild and no great grandchildren. He belongs to [employer].     HEALTH MAINTENANCE: Social History   Tobacco Use  . Smoking status: Former Smoker    Types: Cigarettes    Last attempt to quit: 04/08/1978    Years since quitting:  39.4  . Smokeless tobacco: Never Used  . Tobacco comment: Quit smoking cigarretes in 1980  Substance Use Topics  . Alcohol use: No    Alcohol/week: 0.0 standard drinks  . Drug use: No      Allergies  Allergen Reactions  . Ciprofloxacin   . Hydrocodone Nausea Only    Ok taking in low doses(8m), has nausea with higher doses  . Levofloxacin Other (See Comments)    Pain/cramping   . Oxycodone Nausea Only  . Tramadol Nausea Only  . Cardizem [Diltiazem Hcl] Rash    Current Outpatient Medications  Medication Sig Dispense Refill  . allopurinol (ZYLOPRIM) 300 MG tablet Take 300 mg by mouth  daily.     . aspirin EC 81 MG tablet Take 81 mg by mouth daily.    . bisoprolol (ZEBETA) 10 MG tablet Take 10 mg by mouth every morning.     . Calcium Carbonate-Vitamin D (CALCIUM + D PO) Take 1,200 mg by mouth daily.    . cromolyn (OPTICROM) 4 % ophthalmic solution PLACE 1 DROP INTO BOTH EYES 2 TO 4 TIMES A DAY  2  . fexofenadine (ALLEGRA) 180 MG tablet Take 180 mg by mouth daily as needed (for allergies).     [name] levothyroxine (SYNTHROID, LEVOTHROID) 100 MCG tablet Take 100 mcg by mouth daily before breakfast.    . lisinopril-hydrochlorothiazide (PRINZIDE,ZESTORETIC) 10-12.5 MG per tablet Take 1 tablet by mouth every morning.     . Multiple Vitamin (MULTIVITAMIN) tablet Take 1 tablet by mouth daily.    [name] NITROSTAT 0.4 MG SL tablet     . Omega-3 Fatty Acids (FISH OIL) 1000 MG CAPS Take by mouth. '1000mg'$  po tid    . Polyethyl Glycol-Propyl Glycol (SYSTANE OP) Apply 1 drop to eye 2 (two) times daily. Each eye    . rosuvastatin (CRESTOR) 20 MG tablet Take 20 mg by mouth at bedtime.    . Vitamin D, Ergocalciferol, (DRISDOL) 50000 UNITS CAPS Take 50,000 Units by mouth every 7 (seven) days.      No current facility-administered medications for this visit.     OBJECTIVE: Morbidly obese white man in no acute distress  Vitals:   09/08/17 1256  BP: (!) 150/51  Pulse: (!) 58  Resp: 18  Temp: 98 F  (36.7 C)  SpO2: 96%     Body mass index is 43.27 kg/m.   Wt Readings from Last 3 Encounters:  09/08/17 260 lb (117.9 kg)  05/29/16 270 lb 9.6 oz (122.7 kg)  11/30/15 270 lb (122.5 kg)      ECOG FS:1 - Symptomatic but completely ambulatory  Ocular: Sclerae unicteric, pupils round and equal Lymphatic: No cervical or supraclavicular adenopathy; no axillary adenopathy Lungs no rales or rhonchi Heart regular rate and rhythm Abd soft, obese, nontender, positive bowel sounds, no splenomegaly MSK no focal spinal tenderness, mild right knee edema without erythema Neuro: non-focal, well-oriented (new his anniversary date!), appropriate affect    LAB RESULTS:  CMP     Component Value Date/Time   NA 139 09/01/2017 1140   NA 142 06/09/2012 0944   K 4.3 09/01/2017 1140   K 4.1 06/09/2012 0944   CL 104 09/01/2017 1140   CL 105 06/09/2012 0944   CO2 25 09/01/2017 1140   CO2 27 06/09/2012 0944   GLUCOSE 171 (H) 09/01/2017 1140   GLUCOSE 148 (H) 06/09/2012 0944   BUN 21 09/01/2017 1140   BUN 24.1 06/09/2012 0944   CREATININE 1.13 09/01/2017 1140   CREATININE 1.1 06/09/2012 0944   CALCIUM 9.5 09/01/2017 1140   CALCIUM 9.3 06/09/2012 0944   PROT 7.6 09/01/2017 1140   PROT 7.0 06/09/2012 0944   ALBUMIN 3.9 09/01/2017 1140   ALBUMIN 3.4 (L) 06/09/2012 0944   AST 21 09/01/2017 1140   AST 20 06/09/2012 0944   ALT 20 09/01/2017 1140   ALT 20 06/09/2012 0944   ALKPHOS 63 09/01/2017 1140   ALKPHOS 50 06/09/2012 0944   BILITOT 0.7 09/01/2017 1140   BILITOT 0.91 06/09/2012 0944   GFRNONAA 59 (L) 09/01/2017 1140   GFRAA >60 09/01/2017 1140    Lab Results  Component Value Date   TOTALPROTELP 6.8 09/01/2017   ALBUMINELP  3.4 09/01/2017   A1GS 0.2 09/01/2017   A2GS 0.9 09/01/2017   BETS 0.9 09/01/2017   BETA2SER 3.4 06/09/2012   GAMS 1.4 09/01/2017   MSPIKE 0.8 (H) 09/01/2017   SPEI Comment 09/01/2017     Lab Results  Component Value Date   KPAFRELGTCHN 35.4 (H) 09/01/2017     LAMBDASER 8.8 09/01/2017   KAPLAMBRATIO 4.02 (H) 09/01/2017    Lab Results  Component Value Date   WBC 5.8 09/01/2017   NEUTROABS 3.4 09/01/2017   HGB 14.4 09/01/2017   HCT 42.4 09/01/2017   MCV 95.9 09/01/2017   PLT 149 09/01/2017    _0 @  No results found for: LABCA2  No components found for: HCWCBJ628  No results for input(s): INR in the last 168 hours.  No results found for: LABCA2  No results found for: BTD176  No results found for: HYW737  No results found for: TGG269  No results found for: CA2729  No components found for: HGQUANT  No results found for: CEA1 / No results found for: CEA1   No results found for: AFPTUMOR  No results found for: CHROMOGRNA  No results found for: PSA1  No visits with results within 3 Day(s) from this visit.  Latest known visit with results is:  Appointment on 09/01/2017  Component Date Value Ref Range Status  . Beta-2 Microglobulin 09/01/2017 2.9* 0.6 - 2.4 mg/L Final   Comment: (NOTE) Siemens Immulite 2000 Immunochemiluminometric assay (ICMA) Values obtained with different assay methods or kits cannot be used interchangeably. Results cannot be interpreted as absolute evidence of the presence or absence of malignant disease. Performed At: [facility] [place], [place] [id number] Rush Farmer MD [id number]   . Total Protein ELP 09/01/2017 6.8  6.0 - 8.5 g/dL Final  . Albumin ELP 09/01/2017 3.4  2.9 - 4.4 g/dL Final  . Alpha-1-Globulin 09/01/2017 0.2  0.0 - 0.4 g/dL Final  . Alpha-2-Globulin 09/01/2017 0.9  0.4 - 1.0 g/dL Final  . Beta Globulin 09/01/2017 0.9  0.7 - 1.3 g/dL Final  . Gamma Globulin 09/01/2017 1.4  0.4 - 1.8 g/dL Final  . M-Spike, % 09/01/2017 0.8* Not Observed g/dL Final  . SPE Interp. 09/01/2017 Comment   Final   Comment: (NOTE) The SPE pattern demonstrates a single peak (M-spike) in the gamma region which may represent monoclonal protein. This peak may  also be caused by circulating immune complexes, cryoglobulins, C-reactive protein, fibrinogen or hemolysis.  If clinically indicated, the presence of a monoclonal gammopathy may be confirmed by immuno- fixation, as well as an evaluation of the urine for the presence of Bence-Jones protein. Performed At: [facility] [place], [place] [id number] Rush Farmer MD [id number]   . Comment 09/01/2017 Comment   Final   Comment: (NOTE) Protein electrophoresis scan will follow via computer, mail, or courier delivery.   [name] GLOBULIN, TOTAL 09/01/2017 3.4  2.2 - 3.9 g/dL Corrected  . A/G Ratio 09/01/2017 1.0  0.7 - 1.7 Corrected  . Kappa free light chain 09/01/2017 35.4* 3.3 - 19.4 mg/L Final  . Lamda free light chains 09/01/2017 8.8  5.7 - 26.3 mg/L Final  . Kappa, lamda light chain ratio 09/01/2017 4.02* 0.26 - 1.65 Final   Comment: (NOTE) Performed At: [facility] [place], [place] [id number] Rush Farmer MD [id number]   . Sodium 09/01/2017 139  135 - 145 mmol/L Final  . Potassium 09/01/2017 4.3  3.5 - 5.1 mmol/L Final  . Chloride 09/01/2017  104  98 - 111 mmol/L Final  . CO2 09/01/2017 25  22 - 32 mmol/L Final  . Glucose, Bld 09/01/2017 171* 70 - 99 mg/dL Final  . BUN 09/01/2017 21  8 - 23 mg/dL Final  . Creatinine 09/01/2017 1.13  0.61 - 1.24 mg/dL Final  . Calcium 09/01/2017 9.5  8.9 - 10.3 mg/dL Final  . Total Protein 09/01/2017 7.6  6.5 - 8.1 g/dL Final  . Albumin 09/01/2017 3.9  3.5 - 5.0 g/dL Final  . AST 09/01/2017 21  15 - 41 U/L Final  . ALT 09/01/2017 20  0 - 44 U/L Final  . Alkaline Phosphatase 09/01/2017 63  38 - 126 U/L Final  . Total Bilirubin 09/01/2017 0.7  0.3 - 1.2 mg/dL Final  . GFR, Est Non Af Am 09/01/2017 59* >60 mL/min Final  . GFR, Est AFR Am 09/01/2017 >60  >60 mL/min Final   Comment: (NOTE) The eGFR has been calculated using the CKD EPI equation. This calculation has not been validated in all clinical  situations. eGFR's persistently <60 mL/min signify possible Chronic Kidney Disease.   Georgiann Hahn gap 09/01/2017 10  5 - 15 Final   Performed at [facility], [place] [address] [place], [place] [address]  . WBC Count 09/01/2017 5.8  4.0 - 10.3 K/uL Final  . RBC 09/01/2017 4.43  4.20 - 5.82 MIL/uL Final  . Hemoglobin 09/01/2017 14.4  13.0 - 17.1 g/dL Final  . HCT 09/01/2017 42.4  38.4 - 49.9 % Final  . MCV 09/01/2017 95.9  79.3 - 98.0 fL Final  . MCH 09/01/2017 32.6  27.2 - 33.4 pg Final  . MCHC 09/01/2017 34.0  32.0 - 36.0 g/dL Final  . RDW 09/01/2017 14.7* 11.0 - 14.6 % Final  . Platelet Count 09/01/2017 149  140 - 400 K/uL Final  . Neutrophils Relative % 09/01/2017 59  % Final  . Neutro Abs 09/01/2017 3.4  1.5 - 6.5 K/uL Final  . Lymphocytes Relative 09/01/2017 29  % Final  . Lymphs Abs 09/01/2017 1.7  0.9 - 3.3 K/uL Final  . Monocytes Relative 09/01/2017 8  % Final  . Monocytes Absolute 09/01/2017 0.5  0.1 - 0.9 K/uL Final  . Eosinophils Relative 09/01/2017 3  % Final  . Eosinophils Absolute 09/01/2017 0.2  0.0 - 0.5 K/uL Final  . Basophils Relative 09/01/2017 1  % Final  . Basophils Absolute 09/01/2017 0.0  0.0 - 0.1 K/uL Final   Performed at [facility], [place] [address], [place], [place] [address]    (this displays the last labs from the last 3 days)  Lab Results  Component Value Date   TOTALPROTELP 6.8 09/01/2017   ALBUMINELP 3.4 09/01/2017   A1GS 0.2 09/01/2017   A2GS 0.9 09/01/2017   BETS 0.9 09/01/2017   BETA2SER 3.4 06/09/2012   GAMS 1.4 09/01/2017   MSPIKE 0.8 (H) 09/01/2017   SPEI Comment 09/01/2017   (this displays SPEP labs)  Lab Results  Component Value Date   KPAFRELGTCHN 35.4 (H) 09/01/2017   LAMBDASER 8.8 09/01/2017   KAPLAMBRATIO 4.02 (H) 09/01/2017   (kappa/lambda light chains)  No results found for: HGBA, HGBA2QUANT, HGBFQUANT, HGBSQUAN (Hemoglobinopathy evaluation)   Lab Results  Component Value  Date   LDH 149 01/30/2010    No results found for: IRON, TIBC, IRONPCTSAT (Iron and TIBC)  No results found for: FERRITIN  Urinalysis    Component Value Date/Time   COLORURINE YELLOW 04/06/2010 1832  APPEARANCEUR CLEAR 04/06/2010 1832   LABSPEC 1.009 04/06/2010 1832   PHURINE 7.5 04/06/2010 1832   GLUCOSEU NEGATIVE 04/06/2010 1832   HGBUR NEGATIVE 04/06/2010 1832   BILIRUBINUR NEGATIVE 04/06/2010 1832   KETONESUR NEGATIVE 04/06/2010 1832   PROTEINUR NEGATIVE 04/06/2010 1832   UROBILINOGEN 0.2 04/06/2010 1832   NITRITE NEGATIVE 04/06/2010 1832   LEUKOCYTESUR NEGATIVE 04/06/2010 1832     STUDIES: No results found.  ELIGIBLE FOR AVAILABLE RESEARCH PROTOCOL: no  ASSESSMENT: 84 y.o[place] man with a small M spike first noted July 2011 and stable since that time, consistent with M-GUS   PLAN: We spent more than 50% of today's 40 appointment in counseling and coordination of care regarding the biology of the patient's diagnosis and the specifics of her situation.  We reviewed what immunoglobulins are and what the structure of an immunoglobulin molecule its.  We went over the difference between the intact molecule, and light chains and heavy chains.  We then reviewed his measurements.  His total M spike is unchanged, at 0.8, which is remarkable since it has been 5 years since we last saw him.  There has been a slight increase in his kappa light chains.  This is not of concern in the absence of any anemia, thrombocytopenia, or leukopenia.  It also is not affecting his kidneys.  We do need to obtain a bone survey and this is being ordered.  Assuming the bone survey is unremarkable, as we expect, all he will require is continuing observation.  I would suggest an SPEP and kappa/lambda light chains evaluation yearly, and if there is a significant change he can be referred back.  I am afraid he has multiple other comorbidities which are of greater significance than his  abnormal proteins.  Accordingly I have not made him a return appointment here.  I will be glad to see him at any point in the future though on an as-needed basis      Jarone Ostergaard, [name] Dad, MD  09/08/17 1:21 PM Medical Oncology and Hematology [facility] [address] [place], [place] [address] Tel. [phone]    Fax. [phone]  Alice Rieger, am acting as scribe for Chauncey Cruel MD.  [place]

## 2017-09-08 ENCOUNTER — Inpatient Hospital Stay (HOSPITAL_BASED_OUTPATIENT_CLINIC_OR_DEPARTMENT_OTHER): Payer: Medicare Other | Admitting: Oncology

## 2017-09-08 VITALS — BP 150/51 | HR 58 | Temp 98.0°F | Resp 18 | Ht 65.0 in | Wt 260.0 lb

## 2017-09-08 DIAGNOSIS — D472 Monoclonal gammopathy: Secondary | ICD-10-CM

## 2017-09-10 ENCOUNTER — Telehealth: Payer: Self-pay | Admitting: Oncology

## 2017-09-10 NOTE — Telephone Encounter (Signed)
Per 8/12 no los °

## 2017-09-11 ENCOUNTER — Encounter: Payer: Self-pay | Admitting: Oncology

## 2017-09-12 ENCOUNTER — Other Ambulatory Visit: Payer: Self-pay | Admitting: *Deleted

## 2017-09-25 ENCOUNTER — Ambulatory Visit (HOSPITAL_COMMUNITY)
Admission: RE | Admit: 2017-09-25 | Discharge: 2017-09-25 | Disposition: A | Discharge status: Home / Self Care | Payer: Medicare Other | Source: Ambulatory Visit | Attending: Oncology | Admitting: Oncology

## 2017-09-25 DIAGNOSIS — M4324 Fusion of spine, thoracic region: Secondary | ICD-10-CM | POA: Diagnosis not present

## 2017-09-25 DIAGNOSIS — M4854XA Collapsed vertebra, not elsewhere classified, thoracic region, initial encounter for fracture: Secondary | ICD-10-CM | POA: Diagnosis not present

## 2017-09-25 DIAGNOSIS — M8580 Other specified disorders of bone density and structure, unspecified site: Secondary | ICD-10-CM | POA: Diagnosis not present

## 2017-09-25 DIAGNOSIS — D472 Monoclonal gammopathy: Secondary | ICD-10-CM | POA: Diagnosis present

## 2017-09-30 ENCOUNTER — Other Ambulatory Visit: Payer: Self-pay | Admitting: Oncology

## 2017-09-30 DIAGNOSIS — M810 Age-related osteoporosis without current pathological fracture: Secondary | ICD-10-CM

## 2017-09-30 NOTE — Progress Notes (Unsigned)
I called [name] [name] with the results of his bone survey which is quite equivocal given his severe osteoporosis.  I think he would benefit from Prolia but we do need to discuss it.  I am setting him up for an appointment within the week so we can get that accomplished.  We could then repeat a bone survey in a year.

## 2017-10-01 ENCOUNTER — Telehealth: Payer: Self-pay | Admitting: Oncology

## 2017-10-01 NOTE — Telephone Encounter (Signed)
TRIED TO REACH REGARDING 9/3 [facility] MSG

## 2017-10-06 ENCOUNTER — Inpatient Hospital Stay: Payer: Medicare Other

## 2017-10-06 ENCOUNTER — Encounter: Payer: Self-pay | Admitting: Adult Health

## 2017-10-06 ENCOUNTER — Telehealth: Payer: Self-pay | Admitting: Oncology

## 2017-10-06 ENCOUNTER — Inpatient Hospital Stay: Payer: Medicare Other | Attending: Oncology | Admitting: Adult Health

## 2017-10-06 VITALS — BP 110/94 | HR 58 | Temp 98.4°F | Resp 18 | Ht 65.0 in | Wt 267.1 lb

## 2017-10-06 DIAGNOSIS — M810 Age-related osteoporosis without current pathological fracture: Secondary | ICD-10-CM | POA: Diagnosis not present

## 2017-10-06 DIAGNOSIS — Z87891 Personal history of nicotine dependence: Secondary | ICD-10-CM | POA: Insufficient documentation

## 2017-10-06 DIAGNOSIS — M818 Other osteoporosis without current pathological fracture: Secondary | ICD-10-CM

## 2017-10-06 DIAGNOSIS — D472 Monoclonal gammopathy: Secondary | ICD-10-CM

## 2017-10-06 LAB — COMPREHENSIVE METABOLIC PANEL
ALBUMIN: 3.8 g/dL (ref 3.5–5.0)
ALT: 17 U/L (ref 0–44)
AST: 22 U/L (ref 15–41)
Alkaline Phosphatase: 63 U/L (ref 38–126)
Anion gap: 7 (ref 5–15)
BUN: 31 mg/dL — AB (ref 8–23)
CHLORIDE: 106 mmol/L (ref 98–111)
CO2: 27 mmol/L (ref 22–32)
Calcium: 9.7 mg/dL (ref 8.9–10.3)
Creatinine, Ser: 1.11 mg/dL (ref 0.61–1.24)
GFR calc Af Amer: >60 mL/min (ref >60)
GFR calc non Af Amer: 60 mL/min — ABNORMAL LOW (ref >60)
Glucose, Bld: 152 mg/dL — ABNORMAL HIGH (ref 70–99)
Potassium: 4.8 mmol/L (ref 3.5–5.1)
Sodium: 140 mmol/L (ref 135–145)
Total Bilirubin: 0.6 mg/dL (ref 0.3–1.2)
Total Protein: 7.3 g/dL (ref 6.5–8.1)

## 2017-10-06 LAB — CBC WITH DIFFERENTIAL/PLATELET
Basophils Absolute: 0 10*3/uL (ref 0.0–0.1)
Basophils Relative: 0 %
EOS PCT: 3 %
Eosinophils Absolute: 0.2 10*3/uL (ref 0.0–0.5)
HCT: 40.5 % (ref 38.4–49.9)
Hemoglobin: 13.6 g/dL (ref 13.0–17.1)
LYMPHS ABS: 2.5 10*3/uL (ref 0.9–3.3)
Lymphocytes Relative: 38 %
MCH: 32.4 pg (ref 27.2–33.4)
MCHC: 33.6 g/dL (ref 32.0–36.0)
MCV: 96.4 fL (ref 79.3–98.0)
MONOS PCT: 8 %
Monocytes Absolute: 0.5 10*3/uL (ref 0.1–0.9)
Neutro Abs: 3.3 10*3/uL (ref 1.5–6.5)
Neutrophils Relative %: 51 %
PLATELETS: 166 10*3/uL (ref 140–400)
RBC: 4.2 MIL/uL (ref 4.20–5.82)
RDW: 15 % — ABNORMAL HIGH (ref 11.0–14.6)
WBC: 6.5 10*3/uL (ref 4.0–10.3)

## 2017-10-06 MED ORDER — DENOSUMAB 60 MG/ML ~~LOC~~ SOSY
60.0000 mg | PREFILLED_SYRINGE | Freq: Once | SUBCUTANEOUS | Status: AC
  Administered 2017-10-06: 60 mg via SUBCUTANEOUS

## 2017-10-06 NOTE — Patient Instructions (Signed)
Bone Health Bones protect organs, store calcium, and anchor muscles. Good health habits, such as eating nutritious foods and exercising regularly, are important for maintaining healthy bones. They can also help to prevent a condition that causes bones to lose density and become weak and brittle (osteoporosis). Why is bone mass important? Bone mass refers to the amount of bone tissue that you have. The higher your bone mass, the stronger your bones. An important step toward having healthy bones throughout life is to have strong and dense bones during childhood. A young adult who has a high bone mass is more likely to have a high bone mass later in life. Bone mass at its greatest it is called peak bone mass. A large decline in bone mass occurs in older adults. In women, it occurs about the time of menopause. During this time, it is important to practice good health habits, because if more bone is lost than what is replaced, the bones will become less healthy and more likely to break (fracture). If you find that you have a low bone mass, you may be able to prevent osteoporosis or further bone loss by changing your diet and lifestyle. How can I find out if my bone mass is low? Bone mass can be measured with an X-ray test that is called a bone mineral density (BMD) test. This test is recommended for all women who are age 65 or older. It may also be recommended for men who are age 70 or older, or for people who are more likely to develop osteoporosis due to:  Having bones that break easily.  Having a long-term disease that weakens bones, such as kidney disease or rheumatoid arthritis.  Having menopause earlier than normal.  Taking medicine that weakens bones, such as steroids, thyroid hormones, or hormone treatment for breast cancer or prostate cancer.  Smoking.  Drinking three or more alcoholic drinks each day.  What are the nutritional recommendations for healthy bones? To have healthy bones, you  need to get enough of the right minerals and vitamins. Most nutrition experts recommend getting these nutrients from the foods that you eat. Nutritional recommendations vary from person to person. Ask your health care provider what is healthy for you. Here are some general guidelines. Calcium Recommendations Calcium is the most important (essential) mineral for bone health. Most people can get enough calcium from their diet, but supplements may be recommended for people who are at risk for osteoporosis. Good sources of calcium include:  Dairy products, such as low-fat or nonfat milk, cheese, and yogurt.  Dark green leafy vegetables, such as bok choy and broccoli.  Calcium-fortified foods, such as orange juice, cereal, bread, soy beverages, and tofu products.  Nuts, such as almonds.  Follow these recommended amounts for daily calcium intake:  Children, age 1?3: 700 mg.  Children, age 4?8: 1,000 mg.  Children, age 9?13: 1,300 mg.  Teens, age 14?18: 1,300 mg.  Adults, age 19?50: 1,000 mg.  Adults, age 51?70: ? Men: 1,000 mg. ? Women: 1,200 mg.  Adults, age 71 or older: 1,200 mg.  Pregnant and breastfeeding females: ? Teens: 1,300 mg. ? Adults: 1,000 mg.  Vitamin D Recommendations Vitamin D is the most essential vitamin for bone health. It helps the body to absorb calcium. Sunlight stimulates the skin to make vitamin D, so be sure to get enough sunlight. If you live in a cold climate or you do not get outside often, your health care provider may recommend that you take vitamin   D supplements. Good sources of vitamin D in your diet include:  Egg yolks.  Saltwater fish.  Milk and cereal fortified with vitamin D.  Follow these recommended amounts for daily vitamin D intake:  Children and teens, age 1?18: 600 international units.  Adults, age 50 or younger: 400-800 international units.  Adults, age 51 or older: 800-1,000 international units.  Other Nutrients Other nutrients  for bone health include:  Phosphorus. This mineral is found in meat, poultry, dairy foods, nuts, and legumes. The recommended daily intake for adult men and adult women is 700 mg.  Magnesium. This mineral is found in seeds, nuts, dark green vegetables, and legumes. The recommended daily intake for adult men is 400?420 mg. For adult women, it is 310?320 mg.  Vitamin K. This vitamin is found in green leafy vegetables. The recommended daily intake is 120 mg for adult men and 90 mg for adult women.  What type of physical activity is best for building and maintaining healthy bones? Weight-bearing and strength-building activities are important for building and maintaining peak bone mass. Weight-bearing activities cause muscles and bones to work against gravity. Strength-building activities increases muscle strength that supports bones. Weight-bearing and muscle-building activities include:  Walking and hiking.  Jogging and running.  Dancing.  Gym exercises.  Lifting weights.  Tennis and racquetball.  Climbing stairs.  Aerobics.  Adults should get at least 30 minutes of moderate physical activity on most days. Children should get at least 60 minutes of moderate physical activity on most days. Ask your health care provide what type of exercise is best for you. Where can I find more information? For more information, check out the following websites:  National Osteoporosis Foundation: http://nof.org/learn/basics  National Institutes of Health: http://www.niams.nih.gov/Health_Info/Bone/Bone_Health/bone_health_for_life.asp  This information is not intended to replace advice given to you by your health care provider. Make sure you discuss any questions you have with your health care provider. Document Released: 04/06/2003 Document Revised: 08/04/2015 Document Reviewed: 01/19/2014 Elsevier Interactive Patient Education  2018 [employer]   Denosumab injection What is this  medicine? DENOSUMAB (den oh sue mab) slows bone breakdown. Prolia is used to treat osteoporosis in women after menopause and in men. Xgeva is used to treat a high calcium level due to cancer and to prevent bone fractures and other bone problems caused by multiple myeloma or cancer bone metastases. Xgeva is also used to treat giant cell tumor of the bone. This medicine may be used for other purposes; ask your health care provider or pharmacist if you have questions. COMMON BRAND NAME(S): Prolia, XGEVA What should I tell my health care provider before I take this medicine? They need to know if you have any of these conditions: -dental disease -having surgery or tooth extraction -infection -kidney disease -low levels of calcium or Vitamin D in the blood -malnutrition -on hemodialysis -skin conditions or sensitivity -thyroid or parathyroid disease -an unusual reaction to denosumab, other medicines, foods, dyes, or preservatives -pregnant or trying to get pregnant -breast-feeding How should I use this medicine? This medicine is for injection under the skin. It is given by a health care professional in a hospital or clinic setting. If you are getting Prolia, a special MedGuide will be given to you by the pharmacist with each prescription and refill. Be sure to read this information carefully each time. For Prolia, talk to your pediatrician regarding the use of this medicine in children. Special care may be needed. For Xgeva, talk to your pediatrician regarding the use   of this medicine in children. While this drug may be prescribed for children as young as 13 years for selected conditions, precautions do apply. Overdosage: If you think you have taken too much of this medicine contact a poison control center or emergency room at once. NOTE: This medicine is only for you. Do not share this medicine with others. What if I miss a dose? It is important not to miss your dose. Call your doctor or health  care professional if you are unable to keep an appointment. What may interact with this medicine? Do not take this medicine with any of the following medications: -other medicines containing denosumab This medicine may also interact with the following medications: -medicines that lower your chance of fighting infection -steroid medicines like prednisone or cortisone This list may not describe all possible interactions. Give your health care provider a list of all the medicines, herbs, non-prescription drugs, or dietary supplements you use. Also tell them if you smoke, drink alcohol, or use illegal drugs. Some items may interact with your medicine. What should I watch for while using this medicine? Visit your doctor or health care professional for regular checks on your progress. Your doctor or health care professional may order blood tests and other tests to see how you are doing. Call your doctor or health care professional for advice if you get a fever, chills or sore throat, or other symptoms of a cold or flu. Do not treat yourself. This drug may decrease your body's ability to fight infection. Try to avoid being around people who are sick. You should make sure you get enough calcium and vitamin D while you are taking this medicine, unless your doctor tells you not to. Discuss the foods you eat and the vitamins you take with your health care professional. See your dentist regularly. Brush and floss your teeth as directed. Before you have any dental work done, tell your dentist you are receiving this medicine. Do not become pregnant while taking this medicine or for 5 months after stopping it. Talk with your doctor or health care professional about your birth control options while taking this medicine. Women should inform their doctor if they wish to become pregnant or think they might be pregnant. There is a potential for serious side effects to an unborn child. Talk to your health care professional  or pharmacist for more information. What side effects may I notice from receiving this medicine? Side effects that you should report to your doctor or health care professional as soon as possible: -allergic reactions like skin rash, itching or hives, swelling of the face, lips, or tongue -bone pain -breathing problems -dizziness -jaw pain, especially after dental work -redness, blistering, peeling of the skin -signs and symptoms of infection like fever or chills; cough; sore throat; pain or trouble passing urine -signs of low calcium like fast heartbeat, muscle cramps or muscle pain; pain, tingling, numbness in the hands or feet; seizures -unusual bleeding or bruising -unusually weak or tired Side effects that usually do not require medical attention (report to your doctor or health care professional if they continue or are bothersome): -constipation -diarrhea -headache -joint pain -loss of appetite -muscle pain -runny nose -tiredness -upset stomach This list may not describe all possible side effects. Call your doctor for medical advice about side effects. You may report side effects to FDA at 1-800-FDA-1088. Where should I keep my medicine? This medicine is only given in a clinic, doctor's office, or other health care setting and   will not be stored at home. NOTE: This sheet is a summary. It may not cover all possible information. If you have questions about this medicine, talk to your doctor, pharmacist, or health care provider.  2018 Elsevier/Gold Standard (2016-02-06 19:17:21)  

## 2017-10-06 NOTE — Progress Notes (Signed)
[place]  [phone] [phone]     ID: [name] DOB: 09/24/1932  MR#: [id number]  ESP#:[id number]  Patient Care Team: [name] Roers, MD as PCP - General (Family Medicine) [name] Lever, MD as Consulting Physician (Pulmonary Disease) [name] Kenner, MD (Dermatology) [name], [name] Dad, MD as Consulting Physician (Oncology) [name] Prows, MD as Consulting Physician (Cardiology) [name] Dock, NP OTHER MD:  CHIEF COMPLAINT: M-GUS  CURRENT TREATMENT: observation   HISTORY OF CURRENT ILLNESS: From the original intake note, 06/17/2011:   [name] [name] fell in 06/2009, and was evaluated in the emergency room with plain films of the lumbar spine, which showed no acute abnormality.  A week later he presented with intractable pain to Dr. Gladstone [name], and had to be admitted for further evaluation and for pain control.  Films on 07/02 showed a new compression deformity of T12.  There was no obvious retropulsion.  There was diffuse osteopenia, and a possible radiolucent lesion was noted in the left femur.  This was further evaluated on 07/05, and no radiolucent lesion was seen by CT scan.  The patient has had a fairly protracted course since that time.  He was at [facility] for a while.  He is now back home.  Uses a walker, and he gets around fairly well with that according to the wife.  Further evaluation is planned through Dr. Gladstone [name] and Dr. Melina [name].  As part of his workup in the hospital in early July, the patient had an immunofixation which showed slightly low IgA at 50, slightly low IgM at 34, and a normal IgG at 1400 with a monoclonal IgG kappa paraprotein.  The M-spike at that time, on 07/02, was 0.84 g/dL.  Urine immunofixation showed the IgG kappa, but no Bence-Jones proteins.  Free kappa light chains were 2.16, free lambda light chains 0.10. His subsequent history is as detailed below.  The patient's subsequent history is as detailed  below.  INTERVAL HISTORY: [name] [name] was evaluated in the hematology clinic on 09/08/2017 for his monoclonal gammopathy MGUS accompanied by his wife [name] [name].  He is following up based up on his recent skeletal survey that was equivocal due to his osteoporosis.  Dr. Jana [name] called [name] [name] and his wife and recommended Prolia, with continued surveillance with repeat survey in one year.  [name] [name] and [name] [name] are here to review the Prolia.  REVIEW OF SYSTEMS: [name] [name] is feeling well today.  He does have some lower back pain and has h/o T12 compression fracture.  He is hesitant to take any medications such as tylenol for the pain.  He denies any new issues today.  He is doing well otherwise. He denies any unusual headaches, vision changes, night sweats, or appetite loss.  He is without fevers, chills, fatigue.  He denies shortness of breath, chest pain, or palpitations.  He denies any bowel or bladder changes.  A detailed ROS is otherwise non contributory.     PAST MEDICAL HISTORY: Past Medical History:  Diagnosis Date  . Complication of anesthesia    Pt difficult to intubate; 07/19/03 aborted surgery unable to intubate  . Diabetes mellitus without complication ([place])   . Dysthymic disorder   . Gout    Hx: of in left wrist  . Hearing deficit    Hx: of wears hearing aids  . Hypertension   . Kidney stones    Hx: of  . Multiple myeloma ([place])   . Occlusion and stenosis of unspecified carotid artery   .  Pathologic fracture of vertebrae   . Pneumonia   . Rosacea   . Sleep apnea    Hx: of pt wears CPAP    PAST SURGICAL HISTORY: Past Surgical History:  Procedure Laterality Date  . APPENDECTOMY    . BACK SURGERY  [phone]   lumb  . CARDIAC CATHETERIZATION     Hx: of 04/05/10  . CAROTID ARTERY - SUBCLAVIAN ARTERY BYPASS GRAFT  2004   Hx: of-left  . CARPAL TUNNEL RELEASE Right 04/13/2013   Procedure: RIGHT CARPAL TUNNEL RELEASE;  Surgeon: Cammie Sickle., MD;  Location: [facility];  Service:  Orthopedics;  Laterality: Right;  . CATARACT EXTRACTION     Hx: of left eye only  . COLONOSCOPY    . COLONOSCOPY W/ BIOPSIES AND POLYPECTOMY     Hx: of  . CORONARY ARTERY BYPASS GRAFT  2012   Hx: of 5  . ELBOW SURGERY  1992   Hx: of-lt  . HERNIA REPAIR  1963   Hx: of right  . JOINT REPLACEMENT  2006   Hx: of left knee  . LUMBAR LAMINECTOMY/DECOMPRESSION MICRODISCECTOMY N/A 11/18/2012   Procedure: REVISION L2 - L4 DECOMPRESSION DISCECTOMY 2 LEVELS;  Surgeon: [name] Schools, MD;  Location: [facility];  Service: Orthopedics;  Laterality: N/A;  . ROTATOR CUFF REPAIR  2003   Hx: of right  . TONSILLECTOMY    . TRIGGER FINGER RELEASE Right 04/13/2013   Procedure: RIGHT THUMB A-1 PULLEY RELEASE;  Surgeon: Cammie Sickle., MD;  Location: [facility];  Service: Orthopedics;  Laterality: Right;    FAMILY HISTORY Family History  Problem Relation Age of Onset  . Cancer - Colon Mother   . Heart disease Father   . Alzheimer's disease Brother   . Dementia Other   . Alzheimer's disease Sister   . Alzheimer's disease Brother    The patient's mother died of colon cancer at age 6. The patient's father died of a heart attack at age 7. The patient had 1 brother and 2 sisters. The patient's older brother and sister died of Alzheimer's. The patient's nephew (son of the brother who died of Alzheimer's) had a sinus cancer.   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 He has been married to his wife, [name] [name] for 33 years. He has one daughter, [name] [name] who lives in [place] and works for the school system. He has 1 grandchild and no great grandchildren. He belongs to [employer].     HEALTH MAINTENANCE: Social History   Tobacco Use  . Smoking status: Former Smoker    Types: Cigarettes    Last attempt to quit: 04/08/1978    Years since quitting: 39.5  . Smokeless tobacco: Never Used  . Tobacco  comment: Quit smoking cigarretes in 1980  Substance Use Topics  . Alcohol use: No    Alcohol/week: 0.0 standard drinks  . Drug use: No      Allergies  Allergen Reactions  . Ciprofloxacin   . Hydrocodone Nausea Only    Ok taking in low doses(91m), has nausea with higher doses  . Levofloxacin Other (See Comments)    Pain/cramping   . Oxycodone Nausea Only  . Tramadol Nausea Only  . Cardizem [Diltiazem Hcl] Rash    Current Outpatient Medications  Medication Sig Dispense Refill  . allopurinol (ZYLOPRIM) 300 MG tablet Take 300 mg by mouth daily.     .[name]  aspirin EC 81 MG tablet Take 81 mg by mouth daily.    . bisoprolol (ZEBETA) 10 MG tablet Take 10 mg by mouth every morning.     . cromolyn (OPTICROM) 4 % ophthalmic solution PLACE 1 DROP INTO BOTH EYES 2 TO 4 TIMES A DAY  2  . fexofenadine (ALLEGRA) 180 MG tablet Take 180 mg by mouth daily as needed (for allergies).     [name] levothyroxine (SYNTHROID, LEVOTHROID) 100 MCG tablet Take 100 mcg by mouth daily before breakfast.    . lisinopril-hydrochlorothiazide (PRINZIDE,ZESTORETIC) 10-12.5 MG per tablet Take 1 tablet by mouth every morning.     . Multiple Vitamin (MULTIVITAMIN) tablet Take 1 tablet by mouth daily.    [name] NITROSTAT 0.4 MG SL tablet     . Omega-3 Fatty Acids (FISH OIL) 1000 MG CAPS Take by mouth. 1051m po tid    . Polyethyl Glycol-Propyl Glycol (SYSTANE OP) Apply 1 drop to eye 2 (two) times daily. Each eye    . rosuvastatin (CRESTOR) 20 MG tablet Take 20 mg by mouth at bedtime.    . Vitamin D, Ergocalciferol, (DRISDOL) 50000 UNITS CAPS Take 50,000 Units by mouth every 7 (seven) days.      No current facility-administered medications for this visit.     OBJECTIVE: Morbidly obese white man in no acute distress  Vitals:   10/06/17 1510  BP: (!) 110/94  Pulse: (!) 58  Resp: 18  Temp: 98.4 F (36.9 C)  SpO2: 98%     Body mass index is 44.45 kg/m.   Wt Readings from Last 3 Encounters:  10/06/17 267 lb 1.6 oz (121.2 kg)    09/08/17 260 lb (117.9 kg)  05/29/16 270 lb 9.6 oz (122.7 kg)      ECOG FS:1 - Symptomatic but completely ambulatory GENERAL: Patient is a well appearing older male in no acute distress HEENT:  Sclerae anicteric.  Oropharynx clear and moist. No ulcerations or evidence of oropharyngeal candidiasis. Neck is supple.  NODES:  No cervical, supraclavicular, or axillary lymphadenopathy palpated.  LUNGS:  Clear to auscultation bilaterally.  No wheezes or rhonchi. HEART:  Regular rate and rhythm. No murmur appreciated. ABDOMEN:  Soft, nontender.  Positive, normoactive bowel sounds. No organomegaly palpated. MSK:  No focal spinal tenderness to palpation.  EXTREMITIES:  No peripheral edema.   SKIN:  Clear with no obvious rashes or skin changes. No nail dyscrasia. NEURO:  Nonfocal. Well oriented.  Appropriate affect.      LAB RESULTS:  CMP     Component Value Date/Time   NA 139 09/01/2017 1140   NA 142 06/09/2012 0944   K 4.3 09/01/2017 1140   K 4.1 06/09/2012 0944   CL 104 09/01/2017 1140   CL 105 06/09/2012 0944   CO2 25 09/01/2017 1140   CO2 27 06/09/2012 0944   GLUCOSE 171 (H) 09/01/2017 1140   GLUCOSE 148 (H) 06/09/2012 0944   BUN 21 09/01/2017 1140   BUN 24.1 06/09/2012 0944   CREATININE 1.13 09/01/2017 1140   CREATININE 1.1 06/09/2012 0944   CALCIUM 9.5 09/01/2017 1140   CALCIUM 9.3 06/09/2012 0944   PROT 7.6 09/01/2017 1140   PROT 7.0 06/09/2012 0944   ALBUMIN 3.9 09/01/2017 1140   ALBUMIN 3.4 (L) 06/09/2012 0944   AST 21 09/01/2017 1140   AST 20 06/09/2012 0944   ALT 20 09/01/2017 1140   ALT 20 06/09/2012 0944   ALKPHOS 63 09/01/2017 1140   ALKPHOS 50 06/09/2012 0944   BILITOT 0.7 09/01/2017  1140   BILITOT 0.91 06/09/2012 0944   GFRNONAA 59 (L) 09/01/2017 1140   GFRAA >60 09/01/2017 1140    Lab Results  Component Value Date   TOTALPROTELP 6.8 09/01/2017   ALBUMINELP 3.4 09/01/2017   A1GS 0.2 09/01/2017   A2GS 0.9 09/01/2017   BETS 0.9 09/01/2017    BETA2SER 3.4 06/09/2012   GAMS 1.4 09/01/2017   MSPIKE 0.8 (H) 09/01/2017   SPEI Comment 09/01/2017     Lab Results  Component Value Date   KPAFRELGTCHN 35.4 (H) 09/01/2017   LAMBDASER 8.8 09/01/2017   KAPLAMBRATIO 4.02 (H) 09/01/2017    Lab Results  Component Value Date   WBC 6.5 10/06/2017   NEUTROABS 3.3 10/06/2017   HGB 13.6 10/06/2017   HCT 40.5 10/06/2017   MCV 96.4 10/06/2017   PLT 166 10/06/2017    @LASTCHEMISTRY @  No results found for: LABCA2  No components found for: ITGPQD826  No results for input(s): INR in the last 168 hours.  No results found for: LABCA2  No results found for: EBR830  No results found for: NMM768  No results found for: GSU110  No results found for: CA2729  No components found for: HGQUANT  No results found for: CEA1 / No results found for: CEA1   No results found for: AFPTUMOR  No results found for: CHROMOGRNA  No results found for: PSA1  Appointment on 10/06/2017  Component Date Value Ref Range Status  . WBC 10/06/2017 6.5  4.0 - 10.3 K/uL Final  . RBC 10/06/2017 4.20  4.20 - 5.82 MIL/uL Final  . Hemoglobin 10/06/2017 13.6  13.0 - 17.1 g/dL Final  . HCT 10/06/2017 40.5  38.4 - 49.9 % Final  . MCV 10/06/2017 96.4  79.3 - 98.0 fL Final  . MCH 10/06/2017 32.4  27.2 - 33.4 pg Final  . MCHC 10/06/2017 33.6  32.0 - 36.0 g/dL Final  . RDW 10/06/2017 15.0* 11.0 - 14.6 % Final  . Platelets 10/06/2017 166  140 - 400 K/uL Final  . Neutrophils Relative % 10/06/2017 51  % Final  . Neutro Abs 10/06/2017 3.3  1.5 - 6.5 K/uL Final  . Lymphocytes Relative 10/06/2017 38  % Final  . Lymphs Abs 10/06/2017 2.5  0.9 - 3.3 K/uL Final  . Monocytes Relative 10/06/2017 8  % Final  . Monocytes Absolute 10/06/2017 0.5  0.1 - 0.9 K/uL Final  . Eosinophils Relative 10/06/2017 3  % Final  . Eosinophils Absolute 10/06/2017 0.2  0.0 - 0.5 K/uL Final  . Basophils Relative 10/06/2017 0  % Final  . Basophils Absolute 10/06/2017 0.0  0.0 - 0.1 K/uL  Final   Performed at [facility], [place] [address], [place], [place] [address]    (this displays the last labs from the last 3 days)  Lab Results  Component Value Date   TOTALPROTELP 6.8 09/01/2017   ALBUMINELP 3.4 09/01/2017   A1GS 0.2 09/01/2017   A2GS 0.9 09/01/2017   BETS 0.9 09/01/2017   BETA2SER 3.4 06/09/2012   GAMS 1.4 09/01/2017   MSPIKE 0.8 (H) 09/01/2017   SPEI Comment 09/01/2017   (this displays SPEP labs)  Lab Results  Component Value Date   KPAFRELGTCHN 35.4 (H) 09/01/2017   LAMBDASER 8.8 09/01/2017   KAPLAMBRATIO 4.02 (H) 09/01/2017   (kappa/lambda light chains)  No results found for: HGBA, HGBA2QUANT, HGBFQUANT, HGBSQUAN (Hemoglobinopathy evaluation)   Lab Results  Component Value Date   LDH 149 01/30/2010    No results found for:  IRON, TIBC, IRONPCTSAT (Iron and TIBC)  No results found for: FERRITIN  Urinalysis    Component Value Date/Time   COLORURINE YELLOW 04/06/2010 1832   APPEARANCEUR CLEAR 04/06/2010 1832   LABSPEC 1.009 04/06/2010 1832   PHURINE 7.5 04/06/2010 1832   GLUCOSEU NEGATIVE 04/06/2010 1832   HGBUR NEGATIVE 04/06/2010 1832   BILIRUBINUR NEGATIVE 04/06/2010 1832   KETONESUR NEGATIVE 04/06/2010 1832   PROTEINUR NEGATIVE 04/06/2010 1832   UROBILINOGEN 0.2 04/06/2010 1832   NITRITE NEGATIVE 04/06/2010 1832   LEUKOCYTESUR NEGATIVE 04/06/2010 1832     STUDIES: Dg Bone Survey Met  Result Date: 09/25/2017 CLINICAL DATA:  Monoclonal gammopathy of undetermined significance EXAM: METASTATIC BONE SURVEY COMPARISON:  None. FINDINGS: Skull: Multiple small rounded lucencies suspicious for myeloma involvement. The skull can be heterogeneous in normal patients, especially with osteopenia. Spine: Diffuse demineralization which limits visualization. There is bulky flowing osteophytes with multilevel thoracic ankylosis. T12 is non rib-bearing. Remote T12 compression fracture. Bulky degenerative spurring also seen in  the lumbar spine. There has been remote L5 superior endplate fracture. Advanced facet arthropathy at L4-5. Pelvis: Osteopenia without focal lesion. Lower extremities: Ovoid lucency in the subtrochanteric medullary space of the right femur. Unremarkable left knee arthroplasty. Upper extremities: Endosteal scalloping along the distal clavicles that is likely from osteopenia. Prior rotator cuff repair on the right. Ribs: No visible lesion. Remote lateral left rib fractures that appear healed. IMPRESSION: 1. Exam is complicated by osteopenia. Still, there are lucent foci in the skull and proximal right femur compatible with myelomatous foci. 2. Remote T12 compression fracture with vertebral plana. Remote L5 superior endplate fracture. 3. Advanced spinal degenerative disease with multilevel thoracic ankylosis. Electronically Signed   By: Monte Fantasia M.D.   On: 09/25/2017 16:34    ELIGIBLE FOR AVAILABLE RESEARCH PROTOCOL: no  ASSESSMENT: 84 y.o. [place], [place] man with a small M spike first noted July 2011 and stable since that time, consistent with M-GUS  1. Skeletal survey on 09/25/2017 equivocal and consistent with osteoporosis  (a) Prolia beginning on 10/07/2017 every 6 months   PLAN:  [name] [name] is doing well today. I reviewed Prolia with him in detail, including the reasoning behind it.  We reviewed risks and benefits.  I reviewed his dental health.  He sees the dentist every 6 months and has a good relationship with his dentist.  He has no dental concerns or impending dental work. I did review small risk of osteonecrosis of the jaw, and to update his dentist with the fact that he is receiving prolia every 6 months.  He knows to take extra calcium on the day of his prolia with Tums.  He is already taking calcium and vitamin d supplementation.  I gave him detailed information in his AVS about bone health regarding calcium, vitamin d, other minerals that improve bone density, and weight bearing  exercises he can do at home.  I also gave him detailed information about Prolia and potential adverse effects--or things to call us about if he experiences them.  Donnovan is agreeable, and will proceed with Prolia every 6 months.  I reviewed him with Dr. Jana [name].  We will monitor his MGUS with myeloma labs every 6 months as well.  He will undergo repeat bone survey in end of august, early September, 2020 and see [place] after that is done.    Claudy knows to call us for any questions or concerns prior to his next appointment with [place].  We can certainly see him sooner if  needed.    A total of (30) minutes of face-to-face time was spent with this patient with greater than 50% of that time in counseling and care-coordination.   Wilber Bihari, NP09/09/19 3:32 PM Medical Oncology and Hematology [facility] [address] [place], [place] [address] Tel. [phone]    Fax. [phone]

## 2017-10-06 NOTE — Telephone Encounter (Signed)
Gave avs and calendar ° °

## 2017-10-31 ENCOUNTER — Encounter: Payer: Self-pay | Admitting: Oncology

## 2018-04-06 ENCOUNTER — Inpatient Hospital Stay: Payer: Medicare Other

## 2018-04-06 ENCOUNTER — Inpatient Hospital Stay: Payer: Medicare Other | Attending: Oncology

## 2018-04-06 DIAGNOSIS — M810 Age-related osteoporosis without current pathological fracture: Secondary | ICD-10-CM | POA: Diagnosis not present

## 2018-04-06 DIAGNOSIS — D472 Monoclonal gammopathy: Secondary | ICD-10-CM | POA: Insufficient documentation

## 2018-04-06 DIAGNOSIS — Z79899 Other long term (current) drug therapy: Secondary | ICD-10-CM | POA: Insufficient documentation

## 2018-04-06 DIAGNOSIS — Z87891 Personal history of nicotine dependence: Secondary | ICD-10-CM | POA: Insufficient documentation

## 2018-04-06 DIAGNOSIS — M818 Other osteoporosis without current pathological fracture: Secondary | ICD-10-CM

## 2018-04-06 LAB — COMPREHENSIVE METABOLIC PANEL
ALBUMIN: 3.9 g/dL (ref 3.5–5.0)
ALT: 18 U/L (ref 0–44)
ANION GAP: 11 (ref 5–15)
AST: 22 U/L (ref 15–41)
Alkaline Phosphatase: 58 U/L (ref 38–126)
BILIRUBIN TOTAL: 0.8 mg/dL (ref 0.3–1.2)
BUN: 19 mg/dL (ref 8–23)
CALCIUM: 9.4 mg/dL (ref 8.9–10.3)
CHLORIDE: 104 mmol/L (ref 98–111)
CO2: 25 mmol/L (ref 22–32)
CREATININE: 0.99 mg/dL (ref 0.61–1.24)
GFR calc Af Amer: >60 mL/min (ref >60)
Glucose, Bld: 155 mg/dL — ABNORMAL HIGH (ref 70–99)
POTASSIUM: 4.1 mmol/L (ref 3.5–5.1)
Sodium: 140 mmol/L (ref 135–145)
Total Protein: 7.7 g/dL (ref 6.5–8.1)

## 2018-04-06 LAB — CBC WITH DIFFERENTIAL (CANCER CENTER ONLY)
ABS IMMATURE GRANULOCYTES: 0.02 10*3/uL (ref 0.00–0.07)
BASOS PCT: 1 %
Basophils Absolute: 0 10*3/uL (ref 0.0–0.1)
EOS ABS: 0.2 10*3/uL (ref 0.0–0.5)
EOS PCT: 2 %
HCT: 43.4 % (ref 39.0–52.0)
Hemoglobin: 14.2 g/dL (ref 13.0–17.0)
IMMATURE GRANULOCYTES: 0 %
LYMPHS ABS: 2.3 10*3/uL (ref 0.7–4.0)
Lymphocytes Relative: 36 %
MCH: 31.5 pg (ref 26.0–34.0)
MCHC: 32.7 g/dL (ref 30.0–36.0)
MCV: 96.2 fL (ref 80.0–100.0)
MONO ABS: 0.6 10*3/uL (ref 0.1–1.0)
Monocytes Relative: 9 %
NRBC: 0 % (ref 0.0–0.2)
Neutro Abs: 3.4 10*3/uL (ref 1.7–7.7)
Neutrophils Relative %: 52 %
Platelet Count: 153 10*3/uL (ref 150–400)
RBC: 4.51 MIL/uL (ref 4.22–5.81)
RDW: 14.2 % (ref 11.5–15.5)
WBC: 6.5 10*3/uL (ref 4.0–10.5)

## 2018-04-06 LAB — LACTATE DEHYDROGENASE: LDH: 181 U/L (ref 98–192)

## 2018-04-06 MED ORDER — DENOSUMAB 60 MG/ML ~~LOC~~ SOSY
PREFILLED_SYRINGE | SUBCUTANEOUS | Status: AC
  Filled 2018-04-06: qty 1

## 2018-04-06 MED ORDER — DENOSUMAB 60 MG/ML ~~LOC~~ SOSY
60.0000 mg | PREFILLED_SYRINGE | Freq: Once | SUBCUTANEOUS | Status: AC
  Administered 2018-04-06: 60 mg via SUBCUTANEOUS

## 2018-04-07 ENCOUNTER — Encounter: Payer: Self-pay | Admitting: Oncology

## 2018-04-07 ENCOUNTER — Other Ambulatory Visit: Payer: Self-pay | Admitting: Oncology

## 2018-04-07 LAB — MULTIPLE MYELOMA PANEL, SERUM
ALBUMIN SERPL ELPH-MCNC: 3.9 g/dL (ref 2.9–4.4)
Albumin/Glob SerPl: 1.2 (ref 0.7–1.7)
Alpha 1: 0.2 g/dL (ref 0.0–0.4)
Alpha2 Glob SerPl Elph-Mcnc: 0.9 g/dL (ref 0.4–1.0)
B-Globulin SerPl Elph-Mcnc: 0.9 g/dL (ref 0.7–1.3)
Gamma Glob SerPl Elph-Mcnc: 1.4 g/dL (ref 0.4–1.8)
Globulin, Total: 3.3 g/dL (ref 2.2–3.9)
IGM (IMMUNOGLOBULIN M), SRM: 27 mg/dL (ref 15–143)
IgA: 54 mg/dL — ABNORMAL LOW (ref 61–437)
IgG (Immunoglobin G), Serum: 1508 mg/dL (ref 700–1600)
M Protein SerPl Elph-Mcnc: 0.7 g/dL — ABNORMAL HIGH
TOTAL PROTEIN ELP: 7.2 g/dL (ref 6.0–8.5)

## 2018-04-07 LAB — KAPPA/LAMBDA LIGHT CHAINS
KAPPA FREE LGHT CHN: 28 mg/L — AB (ref 3.3–19.4)
Kappa, lambda light chain ratio: 2.35 — ABNORMAL HIGH (ref 0.26–1.65)
LAMDA FREE LIGHT CHAINS: 11.9 mg/L (ref 5.7–26.3)

## 2018-04-07 LAB — BETA 2 MICROGLOBULIN, SERUM: BETA 2 MICROGLOBULIN: 2.5 mg/L — AB (ref 0.6–2.4)

## 2018-04-07 LAB — IGG, IGA, IGM
IGA: 57 mg/dL — AB (ref 61–437)
IGM (IMMUNOGLOBULIN M), SRM: 28 mg/dL (ref 15–143)
IgG (Immunoglobin G), Serum: 1506 mg/dL (ref 700–1600)

## 2018-04-08 ENCOUNTER — Encounter: Payer: Self-pay | Admitting: Oncology

## 2018-05-27 ENCOUNTER — Encounter: Payer: Self-pay | Admitting: Cardiology

## 2018-05-27 ENCOUNTER — Other Ambulatory Visit: Payer: Self-pay

## 2018-05-27 ENCOUNTER — Telehealth: Payer: Self-pay

## 2018-05-27 ENCOUNTER — Ambulatory Visit (INDEPENDENT_AMBULATORY_CARE_PROVIDER_SITE_OTHER): Payer: Medicare Other | Admitting: Cardiology

## 2018-05-27 VITALS — BP 137/67 | HR 58 | Ht 66.0 in | Wt 256.0 lb

## 2018-05-27 DIAGNOSIS — Z951 Presence of aortocoronary bypass graft: Secondary | ICD-10-CM

## 2018-05-27 DIAGNOSIS — E782 Mixed hyperlipidemia: Secondary | ICD-10-CM | POA: Diagnosis not present

## 2018-05-27 DIAGNOSIS — Z6841 Body Mass Index (BMI) 40.0 and over, adult: Secondary | ICD-10-CM

## 2018-05-27 DIAGNOSIS — I10 Essential (primary) hypertension: Secondary | ICD-10-CM | POA: Diagnosis not present

## 2018-05-27 DIAGNOSIS — I2510 Atherosclerotic heart disease of native coronary artery without angina pectoris: Secondary | ICD-10-CM

## 2018-05-27 DIAGNOSIS — I771 Stricture of artery: Secondary | ICD-10-CM

## 2018-05-27 NOTE — Progress Notes (Signed)
Virtual Visit via Telephone Note: Patient unable to use video assisted device.  This visit type was conducted due to national recommendations for restrictions regarding the COVID-19 Pandemic (e.g. social distancing).  This format is felt to be most appropriate for this patient at this time.  All issues noted in this document were discussed and addressed.  No physical exam was performed.  The patient has consented to conduct a Telehealth visit and understands insurance will be billed.   I connected with@, on 05/27/18 at  by TELEPHONE and verified that I am speaking with the correct person using two identifiers.   I discussed the limitations of evaluation and management by telemedicine and the availability of in person appointments. The patient expressed understanding and agreed to proceed.   I have discussed with patient regarding the safety during COVID Pandemic and steps and precautions to be taken including social distancing, frequent hand wash and use of detergent soap, gels with the patient. I asked the patient to avoid touching mouth, nose, eyes, ears with the hands. I encouraged regular walking around the neighborhood and exercise and regular diet, as long as social distancing can be maintained.  Primary Physician/Referring:  Tamsen Roers, MD  Patient ID: [name] [name], male    DOB: 06/03/1932, 85 y.o.   MRN: [id number]  Chief Complaint  Patient presents with  . Coronary Artery Disease  . Hypertension  . Follow-up    72yr   HPI: [name] [name] is a 84y.o. male  with CABG in 2012, hypertension, mixed hyperlipidemia, hyperglycemia, h/o gout, morbid obesity presents for annual visit. He has asymptomatic, hemodynamically insignificant left subclavian artery stenosis, and no symptoms of claudication. No dizziness, syncope, chest pain.   He has been bothered by right knee arthritis and needing repeated steroid shots and aspiration. He also c/o left arm numbness especially at night.  Denies neck pain. He also c/o numbness left first 3 fingers all the time as well. He did have carpel tunnel surgery right arm years ago.   No recent weight change, lost 8 Lbs.  No leg edema, no PND or orthopnea. Has not used NTG over the past year.   Past Medical History:  Diagnosis Date  . Complication of anesthesia    Pt difficult to intubate; 07/19/03 aborted surgery unable to intubate  . Diabetes mellitus without complication ([facility]   . Dysthymic disorder   . Gout    Hx: of in left wrist  . Hearing deficit    Hx: of wears hearing aids  . Hypertension   . Kidney stones    Hx: of  . Multiple myeloma ([facility]   . Occlusion and stenosis of unspecified carotid artery   . Pathologic fracture of vertebrae   . Pneumonia   . Rosacea   . Sleep apnea    Hx: of pt wears CPAP    Past Surgical History:  Procedure Laterality Date  . APPENDECTOMY    . BACK SURGERY  [phone]  lumb  . CARDIAC CATHETERIZATION     Hx: of 04/05/10  . CAROTID ARTERY - SUBCLAVIAN ARTERY BYPASS GRAFT  2004   Hx: of-left  . CARPAL TUNNEL RELEASE Right 04/13/2013   Procedure: RIGHT CARPAL TUNNEL RELEASE;  Surgeon: RCammie Sickle, MD;  [facility] [facility]  Service: Orthopedics;  Laterality: Right;  . CATARACT EXTRACTION     Hx: of left eye only  . COLONOSCOPY    . COLONOSCOPY W/ BIOPSIES AND POLYPECTOMY  Hx: of  . CORONARY ARTERY BYPASS GRAFT  2012   Hx: of 5  . ELBOW SURGERY  1992   Hx: of-lt  . HERNIA REPAIR  1963   Hx: of right  . JOINT REPLACEMENT  2006   Hx: of left knee  . LUMBAR LAMINECTOMY/DECOMPRESSION MICRODISCECTOMY N/A 11/18/2012   Procedure: REVISION L2 - L4 DECOMPRESSION DISCECTOMY 2 LEVELS;  Surgeon: Melina Schools, MD;  Location: [place];  Service: Orthopedics;  Laterality: N/A;  . ROTATOR CUFF REPAIR  2003   Hx: of right  . TONSILLECTOMY    . TRIGGER FINGER RELEASE Right 04/13/2013   Procedure: RIGHT THUMB A-1 PULLEY RELEASE;  Surgeon: Cammie Sickle., MD;   Location: [place] [facility];  Service: Orthopedics;  Laterality: Right;    Social History   Socioeconomic History  . Marital status: Married    Spouse name: Not on file  . Number of children: 1  . Years of education: college  . Highest education level: Not on file  Occupational History  . Occupation: Retired   Scientific laboratory technician  . Financial resource strain: Not on file  . Food insecurity:    Worry: Not on file    Inability: Not on file  . Transportation needs:    Medical: Not on file    Non-medical: Not on file  Tobacco Use  . Smoking status: Former Smoker    Packs/day: 0.50    Years: 30.00    Pack years: 15.00    Types: Cigarettes    Last attempt to quit: 04/08/1978    Years since quitting: 40.1  . Smokeless tobacco: Never Used  . Tobacco comment: Quit smoking cigarretes in 1980  Substance and Sexual Activity  . Alcohol use: No    Alcohol/week: 0.0 standard drinks  . Drug use: No  . Sexual activity: Not on file  Lifestyle  . Physical activity:    Days per week: Not on file    Minutes per session: Not on file  . Stress: Not on file  Relationships  . Social connections:    Talks on phone: Not on file    Gets together: Not on file    Attends religious service: Not on file    Active member of club or organization: Not on file    Attends meetings of clubs or organizations: Not on file    Relationship status: Not on file  . Intimate partner violence:    Fear of current or ex partner: Not on file    Emotionally abused: Not on file    Physically abused: Not on file    Forced sexual activity: Not on file  Other Topics Concern  . Not on file  Social History Narrative   Rare caffeine use     Current Outpatient Medications on File Prior to Visit  Medication Sig Dispense Refill  . allopurinol (ZYLOPRIM) 300 MG tablet Take 300 mg by mouth daily.     [name] aspirin EC 81 MG tablet Take 81 mg by mouth daily.    . bisoprolol (ZEBETA) 10 MG tablet Take 10 mg by mouth every  morning.     . cromolyn (OPTICROM) 4 % ophthalmic solution PLACE 1 DROP INTO BOTH EYES 2 TO 4 TIMES A DAY  2  . denosumab (PROLIA) 60 MG/ML SOSY injection Every 6 months    . fexofenadine (ALLEGRA) 180 MG tablet Take 180 mg by mouth daily as needed (for allergies).     [name] levothyroxine (SYNTHROID, LEVOTHROID) 100  MCG tablet Take 100 mcg by mouth daily before breakfast.    . lisinopril-hydrochlorothiazide (PRINZIDE,ZESTORETIC) 10-12.5 MG per tablet Take 1 tablet by mouth every morning.     . Multiple Vitamin (MULTIVITAMIN) tablet Take 1 tablet by mouth daily.    . Multiple Vitamins-Minerals (PRESERVISION AREDS PO) daily.    [name] NITROSTAT 0.4 MG SL tablet     . Omega-3 Fatty Acids (FISH OIL) 1000 MG CAPS Take by mouth 4 (four) times daily.     Vladimir Faster Glycol-Propyl Glycol (SYSTANE OP) Apply 1 drop to eye 2 (two) times daily. Each eye    . rosuvastatin (CRESTOR) 20 MG tablet Take 20 mg by mouth at bedtime.    . Vitamin D, Ergocalciferol, (DRISDOL) 50000 UNITS CAPS Take 50,000 Units by mouth every 7 (seven) days.      No current facility-administered medications on file prior to visit.     Review of Systems  Constitution: Negative for chills, decreased appetite, malaise/fatigue and weight gain.  Cardiovascular: Positive for dyspnea on exertion (chronic and stable). Negative for chest pain, leg swelling, orthopnea, palpitations and syncope.  Endocrine: Negative for cold intolerance.  Hematologic/Lymphatic: Does not bruise/bleed easily.  Musculoskeletal: Positive for arthritis (recurrent inflammation right knee) and joint pain. Negative for joint swelling.  Gastrointestinal: Negative for abdominal pain, anorexia and change in bowel habit.  Neurological: Positive for numbness (left arm when he wakes up). Negative for headaches and light-headedness.  Psychiatric/Behavioral: Negative for depression and substance abuse.  All other systems reviewed and are negative.     Objective  Blood pressure  137/67, pulse (!) 58, height _0  (1.676 m), weight 256 lb (116.1 kg). Body mass index is 41.32 kg/m.    Physical Exam  Constitutional: He appears well-developed. No distress.  Morbidly obese  HENT:  Head: Atraumatic.  Eyes: Conjunctivae are normal.  Neck: Neck supple. No thyromegaly present.  Short neck and difficult to evaluate JVP  Cardiovascular: Normal rate, regular rhythm and normal heart sounds. Exam reveals no gallop.  No murmur heard. Pulses:      Carotid pulses are 2+ on the right side and 2+ on the left side.      Dorsalis pedis pulses are 2+ on the right side and 2+ on the left side.       Posterior tibial pulses are 2+ on the right side and 2+ on the left side.  Femoral and popliteal pulse difficult to feel due to patient's body habitus.   Pulmonary/Chest: Effort normal and breath sounds normal.  Abdominal: Soft. Bowel sounds are normal.  Obese. Pannus present  Musculoskeletal: Normal range of motion.        General: No edema.  Neurological: He is alert.  Skin: Skin is warm and dry.  Psychiatric: He has a normal mood and affect.   Radiology: No results found.  Laboratory examination:    CMP Latest Ref Rng & Units 04/06/2018 10/06/2017 09/01/2017  Glucose 70 - 99 mg/dL 155(H) 152(H) 171(H)  BUN 8 - 23 mg/dL 19 31(H) 21  Creatinine 0.61 - 1.24 mg/dL 0.99 1.11 1.13  Sodium 135 - 145 mmol/L 140 140 139  Potassium 3.5 - 5.1 mmol/L 4.1 4.8 4.3  Chloride 98 - 111 mmol/L 104 106 104  CO2 22 - 32 mmol/L _1 Calcium 8.9 - 10.3 mg/dL 9.4 9.7 9.5  Total Protein 6.5 - 8.1 g/dL 7.7 7.3 7.6  Total Bilirubin 0.3 - 1.2 mg/dL 0.8 0.6 0.7  Alkaline Phos 38 - 126 U/L  58 63 63  AST 15 - 41 U/L _0 ALT 0 - 44 U/L _1 CBC Latest Ref Rng & Units 04/06/2018 10/06/2017 09/01/2017  WBC 4.0 - 10.5 K/uL 6.5 6.5 5.8  Hemoglobin 13.0 - 17.0 g/dL 14.2 13.6 14.4  Hematocrit 39.0 - 52.0 % 43.4 40.5 42.4  Platelets 150 - 400 K/uL 153 166 149    Lipid Panel  Sept 2019: By PCP:  Normal labs    Cardiac Studies:   Cardiac Cath: 04/05/10: 3 Vessel CAD; RCA 99% mid; Mid Cx; 80%, Ostial LAD 80%, Mid 90% S/P CABG  x5  04/09/10  LIMA to the LAD, SVG to D1, SVG to OM1 & OM3, SVG to Rt PDA.  Lexiscan stress 04/05/11: Normal perfusion without ischemia, EF 63%. Compared to  03/30/10: Inferior ischemia not present.  Carotid duplex 09/07/2012: No evidence of hemodynamically significant stenosis in the bilateral carotid bifurcation vessels.  There is evidence of heterogeneous plaque in the bilateral carotid artery.  Mild bilateral intimal thickening. Left carotid endarterectomy is widely patient. No significant change since 09/14/11.  Assessment   Hx of CABG x5  04/09/10  LIMA to the LAD, SVG to D1, SVG to OM1 & OM3, SVG to Rt PDA.  Coronary artery disease involving native coronary artery of native heart without angina pectoris  Mixed hyperlipidemia  Essential hypertension  Stenosis of left subclavian artery (HCC) asymptomatic, mild 2014  Class 3 severe obesity due to excess calories with serious comorbidity and body mass index (BMI) of 40.0 to 44.9 in adult [facility])  EKG 05/26/2017: Sinus bradycardia at 52 bpm, first-degree AV block, normal axis, poor R-wave progression cannot exclude anterior infarct old.  No evidence of ischemia.  No significant change compared to EKG in 2018.  Recommendations:   He is presently doing well from cardiac standpoint without recurrence of angina pectoris, dyspnea has remained stable.  Arthritis in his knee continues to be main issue for now.  Also his weight continues to be fairly high.    Weight loss was again discussed with the patient,He states that his labs were normal including normal lipids in September 2019.  He is on appropriate medical therapy including statins and also on aspirin for CAD.  In view of the fact that I could not see him personally face-to-face, I'd like to see him back in 6 months for follow-up.  Blood pressure is well controlled,  recent labs including CBC and CMP are within normal limits.  He does have carotid atherosclerosis and asymptomatic subclavian stenosis but these are very mild.  He denies any symptoms suggestive of claudication, his left arm numbness at night is probably unrelated to PAD but may be related to cervical spine disease.  I'll evaluate his blood pressure in both arms on his next office visit.  Adrian Prows, MD, [facility] 05/27/2018, 1:28 PM [facility] [place] Pager: [phone] Office: [phone] If no answer Cell [phone]

## 2018-05-27 NOTE — Telephone Encounter (Signed)
FYI-Hazel-spouse called to advise that pt has appt in the morning with his pcp and will have labs done there that you discussed during visit today. She will mail results to our office.//ah

## 2018-07-12 ENCOUNTER — Other Ambulatory Visit: Payer: Self-pay | Admitting: Cardiology

## 2018-10-07 NOTE — Progress Notes (Signed)
[facility]  Telephone:(336) [phone] Fax:(336) [phone]     ID: [name] [name] DOB: January 30, 1932  MR#: [id number]  DTO#:[id number]  Patient Care Team: Tamsen Roers, MD as PCP - General (Family Medicine) Deneise Lever, MD as Consulting Physician (Pulmonary Disease) Allyn Kenner, MD (Dermatology) Jenita Rayfield, Virgie Dad, MD as Consulting Physician (Oncology) Adrian Prows, MD as Consulting Physician (Cardiology) Chauncey Cruel, MD OTHER MD:  CHIEF COMPLAINT: M-GUS  CURRENT TREATMENT: prolia   INTERVAL HISTORY: [name] [name] returns today for follow up of his M-GUS.  The interval history is generally unremarkable.  He is being appropriately careful regarding the pandemic.  He continues on Prolia, with a dose due today.  He has had no side effects or complications from this treatment.  Per note from Wilber Bihari, NP at his last visit on 10/06/2017, [name] [name] was to undergo repeat bone survey prior to his appointment today. No pending orders are seen and no appointments scheduled for this.   REVIEW OF SYSTEMS: [name] [name] likes to work in his garden.  He does not go out for walks.  He has breakfast with friends once a week.  He denies unusual pain, fever, rash, drenching sweats, unexplained fatigue or unexplained weight loss.  A detailed review of systems today was otherwise stable   HISTORY OF CURRENT ILLNESS: From the original intake note, 06/17/2011:   [name] [name] fell in 06/2009, and was evaluated in the emergency room with plain films of the lumbar spine, which showed no acute abnormality.  A week later he presented with intractable pain to Dr. Gladstone Lighter, and had to be admitted for further evaluation and for pain control.  Films on 07/02 showed a new compression deformity of T12.  There was no obvious retropulsion.  There was diffuse osteopenia, and a possible radiolucent lesion was noted in the left femur.  This was further evaluated on 07/05, and no radiolucent lesion was seen by CT  scan.  The patient has had a fairly protracted course since that time.  He was at [facility] for a while.  He is now back home.  Uses a walker, and he gets around fairly well with that according to the wife.  Further evaluation is planned through Dr. Gladstone Lighter and Dr. Melina Schools.  As part of his workup in the hospital in early July, the patient had an immunofixation which showed slightly low IgA at 50, slightly low IgM at 34, and a normal IgG at 1400 with a monoclonal IgG kappa paraprotein.  The M-spike at that time, on 07/02, was 0.84 g/dL.  Urine immunofixation showed the IgG kappa, but no Bence-Jones proteins.  Free kappa light chains were 2.16, free lambda light chains 0.10. His subsequent history is as detailed below.  The patient's subsequent history is as detailed below.   PAST MEDICAL HISTORY: Past Medical History:  Diagnosis Date  . Complication of anesthesia    Pt difficult to intubate; 07/19/03 aborted surgery unable to intubate  . Diabetes mellitus without complication ([facility])   . Dysthymic disorder   . Gout    Hx: of in left wrist  . Hearing deficit    Hx: of wears hearing aids  . Hypertension   . Kidney stones    Hx: of  . Multiple myeloma ([facility])   . Occlusion and stenosis of unspecified carotid artery   . Pathologic fracture of vertebrae   . Pneumonia   . Rosacea   . Sleep apnea    Hx: of pt wears  CPAP    PAST SURGICAL HISTORY: Past Surgical History:  Procedure Laterality Date  . APPENDECTOMY    . BACK SURGERY  [phone]   lumb  . CARDIAC CATHETERIZATION     Hx: of 04/05/10  . CAROTID ARTERY - SUBCLAVIAN ARTERY BYPASS GRAFT  2004   Hx: of-left  . CARPAL TUNNEL RELEASE Right 04/13/2013   Procedure: RIGHT CARPAL TUNNEL RELEASE;  Surgeon: Cammie Sickle., MD;  Location: [facility];  Service: Orthopedics;  Laterality: Right;  . CATARACT EXTRACTION     Hx: of left eye only  . COLONOSCOPY    . COLONOSCOPY W/ BIOPSIES AND POLYPECTOMY      Hx: of  . CORONARY ARTERY BYPASS GRAFT  2012   Hx: of 5  . ELBOW SURGERY  1992   Hx: of-lt  . HERNIA REPAIR  1963   Hx: of right  . JOINT REPLACEMENT  2006   Hx: of left knee  . LUMBAR LAMINECTOMY/DECOMPRESSION MICRODISCECTOMY N/A 11/18/2012   Procedure: REVISION L2 - L4 DECOMPRESSION DISCECTOMY 2 LEVELS;  Surgeon: Melina Schools, MD;  Location: [facility];  Service: Orthopedics;  Laterality: N/A;  . ROTATOR CUFF REPAIR  2003   Hx: of right  . TONSILLECTOMY    . TRIGGER FINGER RELEASE Right 04/13/2013   Procedure: RIGHT THUMB A-1 PULLEY RELEASE;  Surgeon: Cammie Sickle., MD;  Location: [facility];  Service: Orthopedics;  Laterality: Right;    FAMILY HISTORY Family History  Problem Relation Age of Onset  . Cancer - Colon Mother   . Heart disease Father   . Alzheimer's disease Brother   . Alzheimer's disease Sister   . Alzheimer's disease Brother   . Dementia Other    The patient's mother died of colon cancer at age 51. The patient's father died of a heart attack at age 86. The patient had 1 brother and 2 sisters. The patient's older brother and sister died of Alzheimer's. The patient's nephew (son of the brother who died of Alzheimer's) had a sinus cancer.    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 He has been married to [name] [name] for 59+ years. He has one daughter, [name] [name] who [place] in [place] and works for the school system. He has 1 grandchild and no great grandchildren. He belongs to [employer].     HEALTH MAINTENANCE: Social History   Tobacco Use  . Smoking status: Former Smoker    Packs/day: 0.50    Years: 30.00    Pack years: 15.00    Types: Cigarettes    Quit date: 04/08/1978    Years since quitting: 40.5  . Smokeless tobacco: Never Used  . Tobacco comment: Quit smoking cigarretes in 1980  Substance Use Topics  . Alcohol use: No    Alcohol/week: 0.0  standard drinks  . Drug use: No      Allergies  Allergen Reactions  . Ciprofloxacin   . Hydrocodone Nausea Only    Ok taking in low doses(25m), has nausea with higher doses  . Levofloxacin Other (See Comments)    Pain/cramping   . Oxycodone Nausea Only  . Tramadol Nausea Only  . Cardizem [Diltiazem Hcl] Rash    Current Outpatient Medications  Medication Sig Dispense Refill  . allopurinol (ZYLOPRIM) 300 MG tablet Take 300 mg by mouth daily.     .Marland Kitchenaspirin EC 81 MG tablet Take 81 mg by mouth  daily.    . bisoprolol (ZEBETA) 10 MG tablet Take 10 mg by mouth every morning.     . cromolyn (OPTICROM) 4 % ophthalmic solution PLACE 1 DROP INTO BOTH EYES 2 TO 4 TIMES A DAY  2  . denosumab (PROLIA) 60 MG/ML SOSY injection Every 6 months    . fexofenadine (ALLEGRA) 180 MG tablet Take 180 mg by mouth daily as needed (for allergies).     [name] levothyroxine (SYNTHROID, LEVOTHROID) 100 MCG tablet Take 100 mcg by mouth daily before breakfast.    . lisinopril-hydrochlorothiazide (PRINZIDE,ZESTORETIC) 10-12.5 MG per tablet Take 1 tablet by mouth every morning.     [name] lisinopril-hydrochlorothiazide (ZESTORETIC) 20-12.5 MG tablet TAKE 1 TABLET BY MOUTH IN  THE MORNING 90 tablet 1  . Multiple Vitamin (MULTIVITAMIN) tablet Take 1 tablet by mouth daily.    . Multiple Vitamins-Minerals (PRESERVISION AREDS PO) daily.    [name] NITROSTAT 0.4 MG SL tablet     . Omega-3 Fatty Acids (FISH OIL) 1000 MG CAPS Take by mouth 4 (four) times daily.     Vladimir Faster Glycol-Propyl Glycol (SYSTANE OP) Apply 1 drop to eye 2 (two) times daily. Each eye    . rosuvastatin (CRESTOR) 20 MG tablet Take 20 mg by mouth at bedtime.    . Vitamin D, Ergocalciferol, (DRISDOL) 50000 UNITS CAPS Take 50,000 Units by mouth every 7 (seven) days.      No current facility-administered medications for this visit.     OBJECTIVE: Morbidly obese white man who appears stated age  69:   10/08/18 1138  BP: (!) 160/94  Pulse: (!) 58  Resp: 18   Temp: 99.1 F (37.3 C)  SpO2: 100%     Body mass index is 41.32 kg/m.   Wt Readings from Last 3 Encounters:  10/08/18 256 lb (116.1 kg)  05/27/18 256 lb (116.1 kg)  10/06/17 267 lb 1.6 oz (121.2 kg)      ECOG FS:1 - Symptomatic but completely ambulatory  Sclerae unicteric, EOMs intact Wearing a mask No cervical or supraclavicular adenopathy Lungs no rales or rhonchi Heart regular rate and rhythm Abd soft, obese, nontender, positive bowel sounds MSK no focal spinal tenderness, no upper extremity lymphedema Neuro: nonfocal, well oriented, appropriate affect   LAB RESULTS:   CMP     Component Value Date/Time   NA 141 10/08/2018 1101   NA 142 06/09/2012 0944   K 3.8 10/08/2018 1101   K 4.1 06/09/2012 0944   CL 106 10/08/2018 1101   CL 105 06/09/2012 0944   CO2 27 10/08/2018 1101   CO2 27 06/09/2012 0944   GLUCOSE 114 (H) 10/08/2018 1101   GLUCOSE 148 (H) 06/09/2012 0944   BUN 24 (H) 10/08/2018 1101   BUN 24.1 06/09/2012 0944   CREATININE 1.02 10/08/2018 1101   CREATININE 1.1 06/09/2012 0944   CALCIUM 9.9 10/08/2018 1101   CALCIUM 9.3 06/09/2012 0944   PROT 7.2 10/08/2018 1101   PROT 7.0 06/09/2012 0944   ALBUMIN 3.9 10/08/2018 1101   ALBUMIN 3.4 (L) 06/09/2012 0944   AST 23 10/08/2018 1101   AST 20 06/09/2012 0944   ALT 18 10/08/2018 1101   ALT 20 06/09/2012 0944   ALKPHOS 55 10/08/2018 1101   ALKPHOS 50 06/09/2012 0944   BILITOT 0.7 10/08/2018 1101   BILITOT 0.91 06/09/2012 0944   GFRNONAA >60 10/08/2018 1101   GFRAA >60 10/08/2018 1101    Lab Results  Component Value Date   TOTALPROTELP 7.2 04/06/2018   ALBUMINELP  3.4 09/01/2017   A1GS 0.2 09/01/2017   A2GS 0.9 09/01/2017   BETS 0.9 09/01/2017   BETA2SER 3.4 06/09/2012   GAMS 1.4 09/01/2017   MSPIKE 0.8 (H) 09/01/2017   SPEI Comment 09/01/2017     Lab Results  Component Value Date   KPAFRELGTCHN 28.0 (H) 04/06/2018   LAMBDASER 11.9 04/06/2018   KAPLAMBRATIO 2.35 (H) 04/06/2018    Lab  Results  Component Value Date   WBC 6.9 10/08/2018   NEUTROABS 4.0 10/08/2018   HGB 14.2 10/08/2018   HCT 42.3 10/08/2018   MCV 95.3 10/08/2018   PLT 166 10/08/2018    @LASTCHEMISTRY @  No results found for: LABCA2  No components found for: YNWGNF621  No results for input(s): INR in the last 168 hours.  No results found for: LABCA2  No results found for: HYQ657  No results found for: QIO962  No results found for: XBM841  No results found for: CA2729  No components found for: HGQUANT  No results found for: CEA1 / No results found for: CEA1   No results found for: AFPTUMOR  No results found for: CHROMOGRNA  No results found for: PSA1  Appointment on 10/08/2018  Component Date Value Ref Range Status  . LDH 10/08/2018 157  98 - 192 U/L Final   Performed at [facility], [place] [address], [place], [place] [address]  . Sodium 10/08/2018 141  135 - 145 mmol/L Final  . Potassium 10/08/2018 3.8  3.5 - 5.1 mmol/L Final  . Chloride 10/08/2018 106  98 - 111 mmol/L Final  . CO2 10/08/2018 27  22 - 32 mmol/L Final  . Glucose, Bld 10/08/2018 114* 70 - 99 mg/dL Final  . BUN 10/08/2018 24* 8 - 23 mg/dL Final  . Creatinine 10/08/2018 1.02  0.61 - 1.24 mg/dL Final  . Calcium 10/08/2018 9.9  8.9 - 10.3 mg/dL Final  . Total Protein 10/08/2018 7.2  6.5 - 8.1 g/dL Final  . Albumin 10/08/2018 3.9  3.5 - 5.0 g/dL Final  . AST 10/08/2018 23  15 - 41 U/L Final  . ALT 10/08/2018 18  0 - 44 U/L Final  . Alkaline Phosphatase 10/08/2018 55  38 - 126 U/L Final  . Total Bilirubin 10/08/2018 0.7  0.3 - 1.2 mg/dL Final  . GFR, Est Non Af Am 10/08/2018 >60  >60 mL/min Final  . GFR, Est AFR Am 10/08/2018 >60  >60 mL/min Final  . Anion gap 10/08/2018 8  5 - 15 Final   Performed at [facility], [place] [address], [place], [place] [address]  . WBC Count 10/08/2018 6.9  4.0 - 10.5 K/uL Final  . RBC 10/08/2018 4.44  4.22 - 5.81 MIL/uL Final  .  Hemoglobin 10/08/2018 14.2  13.0 - 17.0 g/dL Final  . HCT 10/08/2018 42.3  39.0 - 52.0 % Final  . MCV 10/08/2018 95.3  80.0 - 100.0 fL Final  . MCH 10/08/2018 32.0  26.0 - 34.0 pg Final  . MCHC 10/08/2018 33.6  30.0 - 36.0 g/dL Final  . RDW 10/08/2018 15.9* 11.5 - 15.5 % Final  . Platelet Count 10/08/2018 166  150 - 400 K/uL Final  . nRBC 10/08/2018 0.0  0.0 - 0.2 % Final  . Neutrophils Relative % 10/08/2018 59  % Final  . Neutro Abs 10/08/2018 4.0  1.7 - 7.7 K/uL Final  . Lymphocytes Relative 10/08/2018 28  % Final  . Lymphs Abs 10/08/2018 1.9  0.7 - 4.0 K/uL Final  .  Monocytes Relative 10/08/2018 10  % Final  . Monocytes Absolute 10/08/2018 0.7  0.1 - 1.0 K/uL Final  . Eosinophils Relative 10/08/2018 3  % Final  . Eosinophils Absolute 10/08/2018 0.2  0.0 - 0.5 K/uL Final  . Basophils Relative 10/08/2018 0  % Final  . Basophils Absolute 10/08/2018 0.0  0.0 - 0.1 K/uL Final  . Immature Granulocytes 10/08/2018 0  % Final  . Abs Immature Granulocytes 10/08/2018 0.02  0.00 - 0.07 K/uL Final   Performed at [facility], [place] [address] [place], [place] [address]    (this displays the last labs from the last 3 days)  Lab Results  Component Value Date   TOTALPROTELP 7.2 04/06/2018   ALBUMINELP 3.4 09/01/2017   A1GS 0.2 09/01/2017   A2GS 0.9 09/01/2017   BETS 0.9 09/01/2017   BETA2SER 3.4 06/09/2012   GAMS 1.4 09/01/2017   MSPIKE 0.8 (H) 09/01/2017   SPEI Comment 09/01/2017   (this displays SPEP labs)  Lab Results  Component Value Date   KPAFRELGTCHN 28.0 (H) 04/06/2018   LAMBDASER 11.9 04/06/2018   KAPLAMBRATIO 2.35 (H) 04/06/2018   (kappa/lambda light chains)  No results found for: HGBA, HGBA2QUANT, HGBFQUANT, HGBSQUAN (Hemoglobinopathy evaluation)   Lab Results  Component Value Date   LDH 157 10/08/2018    No results found for: IRON, TIBC, IRONPCTSAT (Iron and TIBC)  No results found for: FERRITIN  Urinalysis    Component Value  Date/Time   COLORURINE YELLOW 04/06/2010 1832   APPEARANCEUR CLEAR 04/06/2010 1832   LABSPEC 1.009 04/06/2010 1832   PHURINE 7.5 04/06/2010 1832   GLUCOSEU NEGATIVE 04/06/2010 1832   HGBUR NEGATIVE 04/06/2010 1832   BILIRUBINUR NEGATIVE 04/06/2010 1832   KETONESUR NEGATIVE 04/06/2010 1832   PROTEINUR NEGATIVE 04/06/2010 1832   UROBILINOGEN 0.2 04/06/2010 1832   NITRITE NEGATIVE 04/06/2010 1832   LEUKOCYTESUR NEGATIVE 04/06/2010 1832     STUDIES: No results found.  ELIGIBLE FOR AVAILABLE RESEARCH PROTOCOL: no  ASSESSMENT: 85 y.o. [place], [place] man with a small M spike first noted July 2011 and stable since that time, consistent with M-GUS  1. Skeletal survey on 09/25/2017 equivocal and consistent with osteoporosis  (a) Prolia beginning on 10/07/2017 every 6 months   PLAN:  Bricen is very stable.  We repeated his serum proteins today and we will have those results in a few days.  I will send him those by mail.  He will receive Prolia today and 6 months from today after which we will likely stop that medication and start follow-up on a yearly basis assuming of course he continues well.  We reviewed his lab work that is available today and he was reassured  He knows to call for any other issue that may develop before his next visit here.  Virgie Dad. Mera Gunkel, MD 10/08/18 3:13 PM Medical Oncology and Hematology [facility] [address] [place], [place] [address] Tel. [phone]    Fax. [phone]   I, Wilburn Mylar, am acting as scribe for Dr. Virgie Dad. Manfred Laspina.  I, Lurline Del MD, have reviewed the above documentation for accuracy and completeness, and I agree with the above.

## 2018-10-08 ENCOUNTER — Inpatient Hospital Stay (HOSPITAL_BASED_OUTPATIENT_CLINIC_OR_DEPARTMENT_OTHER): Payer: Medicare Other | Admitting: Oncology

## 2018-10-08 ENCOUNTER — Inpatient Hospital Stay: Payer: Medicare Other

## 2018-10-08 ENCOUNTER — Ambulatory Visit: Payer: Medicare Other

## 2018-10-08 ENCOUNTER — Other Ambulatory Visit: Payer: Self-pay

## 2018-10-08 ENCOUNTER — Inpatient Hospital Stay: Payer: Medicare Other | Attending: Oncology

## 2018-10-08 VITALS — BP 160/94 | HR 58 | Temp 99.1°F | Resp 18 | Ht 66.0 in | Wt 256.0 lb

## 2018-10-08 DIAGNOSIS — M818 Other osteoporosis without current pathological fracture: Secondary | ICD-10-CM

## 2018-10-08 DIAGNOSIS — M810 Age-related osteoporosis without current pathological fracture: Secondary | ICD-10-CM | POA: Diagnosis present

## 2018-10-08 DIAGNOSIS — D472 Monoclonal gammopathy: Secondary | ICD-10-CM | POA: Insufficient documentation

## 2018-10-08 DIAGNOSIS — Z87891 Personal history of nicotine dependence: Secondary | ICD-10-CM | POA: Diagnosis not present

## 2018-10-08 LAB — CMP (CANCER CENTER ONLY)
ALT: 18 U/L (ref 0–44)
AST: 23 U/L (ref 15–41)
Albumin: 3.9 g/dL (ref 3.5–5.0)
Alkaline Phosphatase: 55 U/L (ref 38–126)
Anion gap: 8 (ref 5–15)
BUN: 24 mg/dL — ABNORMAL HIGH (ref 8–23)
CO2: 27 mmol/L (ref 22–32)
Calcium: 9.9 mg/dL (ref 8.9–10.3)
Chloride: 106 mmol/L (ref 98–111)
Creatinine: 1.02 mg/dL (ref 0.61–1.24)
GFR, Est AFR Am: >60 mL/min (ref >60)
GFR, Estimated: >60 mL/min (ref >60)
Glucose, Bld: 114 mg/dL — ABNORMAL HIGH (ref 70–99)
Potassium: 3.8 mmol/L (ref 3.5–5.1)
Sodium: 141 mmol/L (ref 135–145)
Total Bilirubin: 0.7 mg/dL (ref 0.3–1.2)
Total Protein: 7.2 g/dL (ref 6.5–8.1)

## 2018-10-08 LAB — CBC WITH DIFFERENTIAL (CANCER CENTER ONLY)
Abs Immature Granulocytes: 0.02 10*3/uL (ref 0.00–0.07)
Basophils Absolute: 0 10*3/uL (ref 0.0–0.1)
Basophils Relative: 0 %
Eosinophils Absolute: 0.2 10*3/uL (ref 0.0–0.5)
Eosinophils Relative: 3 %
HCT: 42.3 % (ref 39.0–52.0)
Hemoglobin: 14.2 g/dL (ref 13.0–17.0)
Immature Granulocytes: 0 %
Lymphocytes Relative: 28 %
Lymphs Abs: 1.9 10*3/uL (ref 0.7–4.0)
MCH: 32 pg (ref 26.0–34.0)
MCHC: 33.6 g/dL (ref 30.0–36.0)
MCV: 95.3 fL (ref 80.0–100.0)
Monocytes Absolute: 0.7 10*3/uL (ref 0.1–1.0)
Monocytes Relative: 10 %
Neutro Abs: 4 10*3/uL (ref 1.7–7.7)
Neutrophils Relative %: 59 %
Platelet Count: 166 10*3/uL (ref 150–400)
RBC: 4.44 MIL/uL (ref 4.22–5.81)
RDW: 15.9 % — ABNORMAL HIGH (ref 11.5–15.5)
WBC Count: 6.9 10*3/uL (ref 4.0–10.5)
nRBC: 0 % (ref 0.0–0.2)

## 2018-10-08 LAB — LACTATE DEHYDROGENASE: LDH: 157 U/L (ref 98–192)

## 2018-10-08 MED ORDER — DENOSUMAB 60 MG/ML ~~LOC~~ SOSY
PREFILLED_SYRINGE | SUBCUTANEOUS | Status: AC
  Filled 2018-10-08: qty 1

## 2018-10-08 MED ORDER — DENOSUMAB 60 MG/ML ~~LOC~~ SOSY
60.0000 mg | PREFILLED_SYRINGE | Freq: Once | SUBCUTANEOUS | Status: AC
  Administered 2018-10-08: 13:00:00 60 mg via SUBCUTANEOUS

## 2018-10-09 LAB — KAPPA/LAMBDA LIGHT CHAINS
Kappa free light chain: 26.6 mg/L — ABNORMAL HIGH (ref 3.3–19.4)
Kappa, lambda light chain ratio: 3.13 — ABNORMAL HIGH (ref 0.26–1.65)
Lambda free light chains: 8.5 mg/L (ref 5.7–26.3)

## 2018-10-09 LAB — IGG, IGA, IGM
IgA: 44 mg/dL — ABNORMAL LOW (ref 61–437)
IgG (Immunoglobin G), Serum: 1378 mg/dL (ref 603–1613)
IgM (Immunoglobulin M), Srm: 26 mg/dL (ref 15–143)

## 2018-10-09 LAB — BETA 2 MICROGLOBULIN, SERUM: Beta-2 Microglobulin: 2.6 mg/L — ABNORMAL HIGH (ref 0.6–2.4)

## 2018-10-12 ENCOUNTER — Encounter: Payer: Self-pay | Admitting: Oncology

## 2018-10-12 ENCOUNTER — Other Ambulatory Visit: Payer: Self-pay | Admitting: Oncology

## 2018-10-12 LAB — MULTIPLE MYELOMA PANEL, SERUM
Albumin SerPl Elph-Mcnc: 3.8 g/dL (ref 2.9–4.4)
Albumin/Glob SerPl: 1.4 (ref 0.7–1.7)
Alpha 1: 0.2 g/dL (ref 0.0–0.4)
Alpha2 Glob SerPl Elph-Mcnc: 0.8 g/dL (ref 0.4–1.0)
B-Globulin SerPl Elph-Mcnc: 0.7 g/dL (ref 0.7–1.3)
Gamma Glob SerPl Elph-Mcnc: 1.1 g/dL (ref 0.4–1.8)
Globulin, Total: 2.8 g/dL (ref 2.2–3.9)
IgA: 44 mg/dL — ABNORMAL LOW (ref 61–437)
IgG (Immunoglobin G), Serum: 1317 mg/dL (ref 603–1613)
IgM (Immunoglobulin M), Srm: 20 mg/dL (ref 15–143)
M Protein SerPl Elph-Mcnc: 0.7 g/dL — ABNORMAL HIGH
Total Protein ELP: 6.6 g/dL (ref 6.0–8.5)

## 2018-11-12 ENCOUNTER — Other Ambulatory Visit: Payer: Self-pay

## 2018-11-12 DIAGNOSIS — Z20822 Contact with and (suspected) exposure to covid-19: Secondary | ICD-10-CM

## 2018-11-13 LAB — NOVEL CORONAVIRUS, NAA: SARS-CoV-2, NAA: NOT DETECTED

## 2018-11-27 ENCOUNTER — Other Ambulatory Visit: Payer: Self-pay

## 2018-11-27 ENCOUNTER — Ambulatory Visit: Payer: Medicare Other | Admitting: Cardiology

## 2018-11-27 ENCOUNTER — Encounter: Payer: Self-pay | Admitting: Cardiology

## 2018-11-27 VITALS — BP 130/80 | HR 61 | Temp 98.0°F | Ht 66.0 in | Wt 245.7 lb

## 2018-11-27 DIAGNOSIS — E782 Mixed hyperlipidemia: Secondary | ICD-10-CM

## 2018-11-27 DIAGNOSIS — I2510 Atherosclerotic heart disease of native coronary artery without angina pectoris: Secondary | ICD-10-CM | POA: Diagnosis not present

## 2018-11-27 DIAGNOSIS — I10 Essential (primary) hypertension: Secondary | ICD-10-CM

## 2018-11-27 DIAGNOSIS — Z951 Presence of aortocoronary bypass graft: Secondary | ICD-10-CM | POA: Diagnosis not present

## 2018-11-27 NOTE — Progress Notes (Signed)
Primary Physician/Referring:  Tamsen Roers, MD  Patient ID: [name] [name], male    DOB: 1932/12/09, 85 y.o.   MRN: [id number]  Chief Complaint  Patient presents with  . Coronary Artery Disease    Hx of CABG  . Hypertension  . Hyperlipidemia  . Follow-up    85mo   HPI: [name] [name] is a 85y.o. male  with CABG in 2012, hypertension, mixed hyperlipidemia, hyperglycemia, h/o gout, morbid obesity presents for annual visit. Denies chest pain, dizziness, syncope, chest pain.   He has been bothered by right knee arthritis and needing repeated steroid shots and aspiration. He also c/o left arm numbness especially at night. Now scheduled for carpel tunnel surgery soon.  He has lost 10 Lbs. In weight since last OV.  No leg edema, no PND or orthopnea. Has not used NTG over the past year.   Past Medical History:  Diagnosis Date  . Complication of anesthesia    Pt difficult to intubate; 07/19/03 aborted surgery unable to intubate  . Diabetes mellitus without complication ([place]   . Dysthymic disorder   . Gout    Hx: of in left wrist  . Hearing deficit    Hx: of wears hearing aids  . Hypertension   . Kidney stones    Hx: of  . Multiple [place]   . Occlusion and stenosis of unspecified carotid artery   . Pathologic fracture of vertebrae   . Pneumonia   . Rosacea   . Sleep apnea    Hx: of pt wears CPAP    Past Surgical History:  Procedure Laterality Date  . APPENDECTOMY    . BACK SURGERY  [phone]  lumb  . CARDIAC CATHETERIZATION     Hx: of 04/05/10  . CAROTID ARTERY - SUBCLAVIAN ARTERY BYPASS GRAFT  2004   Hx: of-left  . CARPAL TUNNEL RELEASE Right 04/13/2013   Procedure: RIGHT CARPAL TUNNEL RELEASE;  Surgeon: RCammie Sickle, MD;  [facility]  Service: Orthopedics;  Laterality: Right;  . CATARACT EXTRACTION     Hx: of left eye only  . COLONOSCOPY    . COLONOSCOPY W/ BIOPSIES AND POLYPECTOMY     Hx: of  . CORONARY ARTERY BYPASS GRAFT   2012   Hx: of 5  . ELBOW SURGERY  1992   Hx: of-lt  . HERNIA REPAIR  1963   Hx: of right  . JOINT REPLACEMENT  2006   Hx: of left knee  . LUMBAR LAMINECTOMY/DECOMPRESSION MICRODISCECTOMY N/A 11/18/2012   Procedure: REVISION L2 - L4 DECOMPRESSION DISCECTOMY 2 LEVELS;  Surgeon: DMelina Schools MD;  [facility]  Service: Orthopedics;  Laterality: N/A;  . ROTATOR CUFF REPAIR  2003   Hx: of right  . TONSILLECTOMY    . TRIGGER FINGER RELEASE Right 04/13/2013   Procedure: RIGHT THUMB A-1 PULLEY RELEASE;  Surgeon: RCammie Sickle, MD;  [facility]  Service: Orthopedics;  Laterality: Right;    Social History   Socioeconomic History  . Marital status: Married    Spouse name: Not on file  . Number of children: 1  . Years of education: college  . Highest education level: Not on file  Occupational History  . Occupation: Retired   SScientific laboratory technician . Financial resource strain: Not on file  . Food insecurity    Worry: Not on file    Inability: Not on file  . Transportation needs  Medical: Not on file    Non-medical: Not on file  Tobacco Use  . Smoking status: Former Smoker    Packs/day: 0.50    Years: 30.00    Pack years: 15.00    Types: Cigarettes    Quit date: 04/08/1978    Years since quitting: 40.6  . Smokeless tobacco: Never Used  . Tobacco comment: Quit smoking cigarretes in 1980  Substance and Sexual Activity  . Alcohol use: No    Alcohol/week: 0.0 standard drinks  . Drug use: No  . Sexual activity: Not on file  Lifestyle  . Physical activity    Days per week: Not on file    Minutes per session: Not on file  . Stress: Not on file  Relationships  . Social Herbalist on phone: Not on file    Gets together: Not on file    Attends religious service: Not on file    Active member of club or organization: Not on file    Attends meetings of clubs or organizations: Not on file    Relationship status: Not on file  . Intimate partner  violence    Fear of current or ex partner: Not on file    Emotionally abused: Not on file    Physically abused: Not on file    Forced sexual activity: Not on file  Other Topics Concern  . Not on file  Social History Narrative   Rare caffeine use     Current Outpatient Medications on File Prior to Visit  Medication Sig Dispense Refill  . allopurinol (ZYLOPRIM) 300 MG tablet Take 300 mg by mouth daily.     [name] amoxicillin (AMOXIL) 500 MG capsule TAKE 4 CAPSULES 1 HOUR BEFORE APPOINTMENT    . aspirin EC 81 MG tablet Take 81 mg by mouth daily.    . bisoprolol (ZEBETA) 10 MG tablet Take 10 mg by mouth every morning.     . cromolyn (OPTICROM) 4 % ophthalmic solution PLACE 1 DROP INTO BOTH EYES 2 TO 4 TIMES A DAY  2  . denosumab (PROLIA) 60 MG/ML SOSY injection Every 6 months    . fexofenadine (ALLEGRA) 180 MG tablet Take 180 mg by mouth daily as needed (for allergies).     . fluticasone (FLONASE) 50 MCG/ACT nasal spray as needed.    . gabapentin (NEURONTIN) 100 MG capsule as needed.    [name] levothyroxine (SYNTHROID, LEVOTHROID) 100 MCG tablet Take 100 mcg by mouth daily before breakfast.    . lisinopril-hydrochlorothiazide (ZESTORETIC) 20-12.5 MG tablet TAKE 1 TABLET BY MOUTH IN  THE MORNING 90 tablet 1  . Multiple Vitamin (MULTIVITAMIN) tablet Take 1 tablet by mouth daily.    . Multiple Vitamins-Minerals (PRESERVISION AREDS PO) daily.    [name] NITROSTAT 0.4 MG SL tablet     . Omega-3 Fatty Acids (FISH OIL) 1000 MG CAPS Take by mouth 4 (four) times daily.     Vladimir Faster Glycol-Propyl Glycol (SYSTANE OP) Apply 1 drop to eye 2 (two) times daily. Each eye    . rosuvastatin (CRESTOR) 20 MG tablet Take 20 mg by mouth at bedtime.    . Vitamin D, Ergocalciferol, (DRISDOL) 50000 UNITS CAPS Take 50,000 Units by mouth every 7 (seven) days.     [name] lisinopril-hydrochlorothiazide (PRINZIDE,ZESTORETIC) 10-12.5 MG per tablet Take 1 tablet by mouth every morning.      No current facility-administered medications  on file prior to visit.    Review of Systems  Constitution: Negative for  chills, decreased appetite, malaise/fatigue and weight gain.  Cardiovascular: Positive for dyspnea on exertion (chronic and stable) and leg swelling (mild). Negative for chest pain, orthopnea, palpitations and syncope.  Endocrine: Negative for cold intolerance.  Hematologic/Lymphatic: Does not bruise/bleed easily.  Musculoskeletal: Positive for arthritis (recurrent inflammation right knee) and joint pain. Negative for joint swelling.  Gastrointestinal: Negative for abdominal pain, anorexia and change in bowel habit.  Neurological: Positive for numbness (left arm when he wakes up). Negative for headaches and light-headedness.  Psychiatric/Behavioral: Negative for depression and substance abuse.  All other systems reviewed and are negative.     Objective   Vitals with BMI 11/27/2018 10/08/2018 05/27/2018  Height _0  _1  _2   Weight 245 lbs 11 oz 256 lbs 256 lbs  BMI 39.68 39.53 20.23  Systolic 343 568 616  Diastolic 80 94 67  Pulse 61 58 58    Physical Exam  Constitutional: He appears well-developed. No distress.  Morbidly obese  HENT:  Head: Atraumatic.  Eyes: Conjunctivae are normal.  Neck: Neck supple. No thyromegaly present.  Short neck and difficult to evaluate JVP  Cardiovascular: Normal rate, regular rhythm, normal heart sounds, intact distal pulses and normal pulses. Exam reveals no gallop.  No murmur heard. Pulses:      Carotid pulses are 2+ on the right side and 2+ on the left side. Femoral and popliteal pulse difficult to feel due to patient's body habitus.  2+ bilaterl ankle edema.  Pulmonary/Chest: Effort normal and breath sounds normal.  Abdominal: Soft. Bowel sounds are normal.  Obese. Pannus present  Musculoskeletal: Normal range of motion.        General: No edema.  Neurological: He is alert.  Skin: Skin is warm and dry.  Psychiatric: He has a normal mood and affect.    Radiology: No results found.  Laboratory examination:    CMP Latest Ref Rng & Units 10/08/2018 04/06/2018 10/06/2017  Glucose 70 - 99 mg/dL 114(H) 155(H) 152(H)  BUN 8 - 23 mg/dL 24(H) 19 31(H)  Creatinine 0.61 - 1.24 mg/dL 1.02 0.99 1.11  Sodium 135 - 145 mmol/L 141 140 140  Potassium 3.5 - 5.1 mmol/L 3.8 4.1 4.8  Chloride 98 - 111 mmol/L 106 104 106  CO2 22 - 32 mmol/L _3 Calcium 8.9 - 10.3 mg/dL 9.9 9.4 9.7  Total Protein 6.5 - 8.1 g/dL 7.2 7.7 7.3  Total Bilirubin 0.3 - 1.2 mg/dL 0.7 0.8 0.6  Alkaline Phos 38 - 126 U/L 55 58 63  AST 15 - 41 U/L _4 ALT 0 - 44 U/L _5 CBC Latest Ref Rng & Units 10/08/2018 04/06/2018 10/06/2017  WBC 4.0 - 10.5 K/uL 6.9 6.5 6.5  Hemoglobin 13.0 - 17.0 g/dL 14.2 14.2 13.6  Hematocrit 39.0 - 52.0 % 42.3 43.4 40.5  Platelets 150 - 400 K/uL 166 153 166   Lipid Panel  Sept 2019: By PCP: Normal labs    Cardiac Studies:   Cardiac Cath: 04/05/10: 3 Vessel CAD; RCA 99% mid; Mid Cx; 80%, Ostial LAD 80%, Mid 90% S/P CABG  x5  04/09/10  LIMA to the LAD, SVG to D1, SVG to OM1 & OM3, SVG to Rt PDA.  Lexiscan stress 04/05/11: Normal perfusion without ischemia, EF 63%. Compared to  03/30/10: Inferior ischemia not present.  Carotid duplex 09/07/2012: No evidence of hemodynamically significant stenosis in the bilateral carotid bifurcation vessels.  There is evidence of heterogeneous plaque in the bilateral carotid  artery.  Mild bilateral intimal thickening. Left carotid endarterectomy is widely patient. No significant change since 09/14/11.  Assessment     ICD-10-CM   1. Coronary artery disease involving native coronary artery of native heart without angina pectoris  I25.10 EKG 12-Lead  2. Hx of CABG x5  04/09/10  LIMA to the LAD, SVG to D1, SVG to OM1 & OM3, SVG to Rt PDA.  Z95.1 EKG 12-Lead  3. Mixed hyperlipidemia  E78.2   4. Essential hypertension  I10     EKG 11/27/2018:Sinus rhythm with borderline first-degree AV block at the rate of 59 bpm,  normal axis, incomplete right bundle branch block.  Poor R-wave progression, probably normal variant.  No evidence of ischemia. No significant change from  EKG 05/26/2017.ischemia.  No significant change compared to EKG in 2018.  Recommendations:   He is presently doing well from cardiac standpoint without recurrence of angina pectoris, dyspnea has remained stable.  Arthritis in his knee continues to be main issue for now along with left hand carpel tunnel issues and having surgery for his left hand soon by Dr. Apolonio Schneiders..  He has lost 12 Lbs in 4-5 months.  He may proceed with surgery with low risk.   Blood pressure is well controlled, recent labs including CBC and CMP are within normal limits.  Lipids being managed by PCP, goal LDL <70 mg%.   He does have carotid atherosclerosis and asymptomatic subclavian stenosis but these are very mild.  He denies any symptoms suggestive of claudication.  Adrian Prows, MD, [facility] 11/27/2018, 1:33 PM [facility] [place] Pager: [phone] Office: [phone] If no answer Cell [phone]

## 2018-12-01 ENCOUNTER — Other Ambulatory Visit: Payer: Self-pay | Admitting: Cardiology

## 2019-03-07 ENCOUNTER — Other Ambulatory Visit: Payer: Self-pay | Admitting: Oncology

## 2019-03-09 ENCOUNTER — Telehealth: Payer: Self-pay | Admitting: Oncology

## 2019-03-09 NOTE — Telephone Encounter (Signed)
I talk with patient regarding schedule  

## 2019-03-29 ENCOUNTER — Telehealth: Payer: Self-pay

## 2019-03-29 NOTE — Telephone Encounter (Signed)
Patient's spouse called and stated patient started experiencing chest pain that felt like a "muscle spasm" across his chest on Friday 2/26 around 2pm and lasted until 7 pm. Patient has not experienced any symptoms since 2/26 but would like to be seen today or tomorrow. Please advise. Thanks.

## 2019-03-29 NOTE — Telephone Encounter (Signed)
Patient can see Dr. Terri Skains tomorrow (Tuesday).

## 2019-03-30 ENCOUNTER — Encounter: Payer: Self-pay | Admitting: Cardiology

## 2019-03-30 ENCOUNTER — Other Ambulatory Visit: Payer: Self-pay

## 2019-03-30 ENCOUNTER — Other Ambulatory Visit: Payer: Self-pay | Admitting: Cardiology

## 2019-03-30 ENCOUNTER — Ambulatory Visit: Payer: Medicare Other | Admitting: Cardiology

## 2019-03-30 VITALS — BP 170/80 | HR 59 | Temp 97.3°F | Ht 66.0 in | Wt 253.0 lb

## 2019-03-30 DIAGNOSIS — Z87891 Personal history of nicotine dependence: Secondary | ICD-10-CM

## 2019-03-30 DIAGNOSIS — I2510 Atherosclerotic heart disease of native coronary artery without angina pectoris: Secondary | ICD-10-CM

## 2019-03-30 DIAGNOSIS — Z951 Presence of aortocoronary bypass graft: Secondary | ICD-10-CM

## 2019-03-30 DIAGNOSIS — I10 Essential (primary) hypertension: Secondary | ICD-10-CM

## 2019-03-30 DIAGNOSIS — E782 Mixed hyperlipidemia: Secondary | ICD-10-CM

## 2019-03-30 DIAGNOSIS — G4733 Obstructive sleep apnea (adult) (pediatric): Secondary | ICD-10-CM

## 2019-03-30 MED ORDER — LOSARTAN POTASSIUM 50 MG PO TABS: 50.0000 mg | ORAL_TABLET | Freq: Every evening | ORAL | 1 refills | Status: DC

## 2019-03-30 MED ORDER — AMLODIPINE BESYLATE 5 MG PO TABS: 5.0000 mg | ORAL_TABLET | Freq: Every morning | ORAL | 1 refills | Status: DC

## 2019-03-30 MED ORDER — HYDROCHLOROTHIAZIDE 12.5 MG PO CAPS: 12.5000 mg | ORAL_CAPSULE | Freq: Every morning | ORAL | 1 refills | Status: DC

## 2019-03-30 MED ORDER — NITROGLYCERIN 0.4 MG SL SUBL: 0.4000 mg | SUBLINGUAL_TABLET | SUBLINGUAL | 3 refills | Status: AC | PRN

## 2019-03-30 NOTE — Progress Notes (Signed)
Primary Physician/Referring:  Tamsen Roers, MD  Patient ID: [name] [name], male    DOB: Sep 01, 1932, 86 y.o.   MRN: [id number]  Chief Complaint  Patient presents with  . Chest Pain  . Coronary Artery Disease  . Hypertension    Elevated blood pressures    HPI: [name] [name]  is a 86 y.o. male his past medical history and cardiovascular risk factors include: Established coronary disease with CABG in 2012, hypertension, mixed hyperlipidemia, hyperglycemia, h/o gout, OSA on CPAP, former smoker, morbid obesity who presents to the office with chief complaint of chest pain and elevated blood pressures.  Patient's primary cardiologist is Dr. Adrian Prows and I am seeing him for the first time for the above-mentioned chief complaint and an add-on visit.  Patient is accompanied by his wife at today's office visit.  Chest pain: Patient states that on March 25, 2018 he was resting watching TV when he noticed chest discomfort at approximately 2 PM.  Patient describes his discomfort as an ache-like sensation, intensity 9 out of 10, constant, located mostly over the left breast tissue and traveling to the right chest, localizable with his hand, noneffort related, did not improve with rest.  No improving or worsening factors.  Patient did not take any medications for this.  He initially thought it was heartburn related so he took Tums but no resolution of symptoms.  His wife recommended checking his blood pressure and there was noted to be 220/185.  My wife recommended that he goes to the hospital for further management of his chest discomfort given his above-mentioned history and elevated blood pressures but because of COVID-19 pandemic patient refused.  Patient states that they continue to check blood pressures periodically throughout the night and his blood pressures did improve to approximately 190/91 and by 3:30 AM his chest pain was approximately 6 out of 10.  Patient states that over the next couple of  days the chest pain continued to improve but because of his wife's recommendation he comes to the office for further evaluation.  At the time of the evaluation patient denies any active chest pain at rest or with effort related activities.  He did not have any anginal-like symptoms walking from the parking lot to the office today's office visit.  Hypertension: Patient's wife states that his home blood pressure usually is 150/80.  But recently it has been elevated.  Patient states that he has been compliant with his medical therapy.  And when discussing other causes of hypertension patient does state that he has high salt intake in his diet.  When discussing other cardiovascular risk factors patient states that his hemoglobin A1c was noted to be 7.0 in the past but he is not currently being treated for diabetes.  I do not have past labs for review at the time of the encounter.  Past Medical History:  Diagnosis Date  . Complication of anesthesia    Pt difficult to intubate; 07/19/03 aborted surgery unable to intubate  . Coronary artery disease   . Diabetes mellitus without complication ([place])   . Dysthymic disorder   . Gout    Hx: of in left wrist  . Hearing deficit    Hx: of wears hearing aids  . Hypertension   . Kidney stones    Hx: of  . Multiple myeloma ([place])   . Occlusion and stenosis of unspecified carotid artery   . Pathologic fracture of vertebrae   . Pneumonia   . Rosacea   .  Sleep apnea    Hx: of pt wears CPAP    Past Surgical History:  Procedure Laterality Date  . APPENDECTOMY    . BACK SURGERY  [phone]   lumb  . CARDIAC CATHETERIZATION     Hx: of 04/05/10  . CAROTID ARTERY - SUBCLAVIAN ARTERY BYPASS GRAFT  2004   Hx: of-left  . CARPAL TUNNEL RELEASE Right 04/13/2013   Procedure: RIGHT CARPAL TUNNEL RELEASE;  Surgeon: Cammie Sickle., MD;  Location: [facility];  Service: Orthopedics;  Laterality: Right;  . CATARACT EXTRACTION     Hx: of left eye  only  . COLONOSCOPY    . COLONOSCOPY W/ BIOPSIES AND POLYPECTOMY     Hx: of  . CORONARY ARTERY BYPASS GRAFT  2012   Hx: of 5  . ELBOW SURGERY  1992   Hx: of-lt  . HERNIA REPAIR  1963   Hx: of right  . JOINT REPLACEMENT  2006   Hx: of left knee  . LUMBAR LAMINECTOMY/DECOMPRESSION MICRODISCECTOMY N/A 11/18/2012   Procedure: REVISION L2 - L4 DECOMPRESSION DISCECTOMY 2 LEVELS;  Surgeon: Melina Schools, MD;  Location: [facility];  Service: Orthopedics;  Laterality: N/A;  . ROTATOR CUFF REPAIR  2003   Hx: of right  . TONSILLECTOMY    . TRIGGER FINGER RELEASE Right 04/13/2013   Procedure: RIGHT THUMB A-1 PULLEY RELEASE;  Surgeon: Cammie Sickle., MD;  Location: [facility];  Service: Orthopedics;  Laterality: Right;    Social History   Socioeconomic History  . Marital status: Married    Spouse name: Not on file  . Number of children: 1  . Years of education: college  . Highest education level: Not on file  Occupational History  . Occupation: Retired   Tobacco Use  . Smoking status: Former Smoker    Packs/day: 0.50    Years: 30.00    Pack years: 15.00    Types: Cigarettes    Quit date: 04/08/1978    Years since quitting: 41.0  . Smokeless tobacco: Never Used  . Tobacco comment: Quit smoking cigarretes in 1980  Substance and Sexual Activity  . Alcohol use: No    Alcohol/week: 0.0 standard drinks  . Drug use: No  . Sexual activity: Not on file  Other Topics Concern  . Not on file  Social History Narrative   Rare caffeine use    Social Determinants of Health   Financial Resource Strain:   . Difficulty of Paying Living Expenses: Not on file  Food Insecurity:   . Worried About Charity fundraiser in the Last Year: Not on file  . Ran Out of Food in the Last Year: Not on file  Transportation Needs:   . Lack of Transportation (Medical): Not on file  . Lack of Transportation (Non-Medical): Not on file  Physical Activity:   . Days of Exercise per Week: Not on  file  . Minutes of Exercise per Session: Not on file  Stress:   . Feeling of Stress : Not on file  Social Connections:   . Frequency of Communication with Friends and Family: Not on file  . Frequency of Social Gatherings with Friends and Family: Not on file  . Attends Religious Services: Not on file  . Active Member of Clubs or Organizations: Not on file  . Attends Archivist Meetings: Not on file  . Marital Status: Not on file  Intimate Partner Violence:   . Fear of Current or  Ex-Partner: Not on file  . Emotionally Abused: Not on file  . Physically Abused: Not on file  . Sexually Abused: Not on file    Current Outpatient Medications on File Prior to Visit  Medication Sig Dispense Refill  . allopurinol (ZYLOPRIM) 300 MG tablet Take 300 mg by mouth daily.     [name] amoxicillin (AMOXIL) 500 MG capsule TAKE 4 CAPSULES 1 HOUR BEFORE APPOINTMENT    . aspirin EC 81 MG tablet Take 81 mg by mouth daily.    . bisoprolol (ZEBETA) 10 MG tablet Take 10 mg by mouth every morning.     . cromolyn (OPTICROM) 4 % ophthalmic solution PLACE 1 DROP INTO BOTH EYES 2 TO 4 TIMES A DAY  2  . denosumab (PROLIA) 60 MG/ML SOSY injection Every 6 months    . fexofenadine (ALLEGRA) 180 MG tablet Take 180 mg by mouth daily as needed (for allergies).     . fluticasone (FLONASE) 50 MCG/ACT nasal spray as needed.    . gabapentin (NEURONTIN) 100 MG capsule as needed.    [name] levothyroxine (SYNTHROID, LEVOTHROID) 100 MCG tablet Take 100 mcg by mouth daily before breakfast.    . Multiple Vitamin (MULTIVITAMIN) tablet Take 1 tablet by mouth daily.    . Multiple Vitamins-Minerals (PRESERVISION AREDS PO) daily.    . Omega-3 Fatty Acids (FISH OIL) 1000 MG CAPS Take by mouth 4 (four) times daily.     . rosuvastatin (CRESTOR) 20 MG tablet Take 20 mg by mouth at bedtime.    Vladimir Faster Glycol-Propyl Glycol (SYSTANE OP) Apply 1 drop to eye 2 (two) times daily. Each eye    . Vitamin D, Ergocalciferol, (DRISDOL) 50000 UNITS  CAPS Take 50,000 Units by mouth every 7 (seven) days.      No current facility-administered medications on file prior to visit.   Review of Systems  Constitution: Negative for chills, decreased appetite and malaise/fatigue.  Eyes: Negative for blurred vision.  Cardiovascular: Negative for chest pain, dyspnea on exertion (chronic and stable), leg swelling, orthopnea, palpitations, paroxysmal nocturnal dyspnea and syncope.  Respiratory: Positive for shortness of breath (Chronic and stable) and snoring.   Endocrine: Negative for cold intolerance.  Hematologic/Lymphatic: Does not bruise/bleed easily.  Musculoskeletal: Positive for arthritis and joint pain. Negative for joint swelling.  Gastrointestinal: Negative for abdominal pain, anorexia and change in bowel habit.  Neurological: Negative for headaches, light-headedness and numbness.  Psychiatric/Behavioral: Negative for depression and substance abuse.  All other systems reviewed and are negative.     Objective   Vitals with BMI 03/30/2019 11/27/2018 10/08/2018  Height 5' 6"  5' 6"  5' 6"   Weight 253 lbs 245 lbs 11 oz 256 lbs  BMI 40.85 20.94 70.96  Systolic 283 662 947  Diastolic 80 80 94  Pulse 59 61 58    Physical Exam  Constitutional: He appears well-developed. No distress.  Morbidly obese  HENT:  Head: Atraumatic.  Eyes: Conjunctivae are normal.  Neck: No thyromegaly present.  Short neck and difficult to evaluate JVP  Cardiovascular: Normal rate, regular rhythm, normal heart sounds, intact distal pulses and normal pulses. Exam reveals no gallop.  No murmur heard. Pulses:      Carotid pulses are 2+ on the right side and 2+ on the left side. Femoral and popliteal pulse difficult to feel due to patient's body habitus.  2+ bilaterl ankle edema.  Pulmonary/Chest: Effort normal and breath sounds normal.  Abdominal: Soft. Bowel sounds are normal.  Obese. Pannus present  Musculoskeletal:  General: No edema. Normal range of  motion.     Cervical back: Neck supple.  Neurological: He is alert.  Skin: Skin is warm and dry.  Psychiatric: He has a normal mood and affect.   Radiology: No results found.  Laboratory examination:    CMP Latest Ref Rng & Units 10/08/2018 04/06/2018 10/06/2017  Glucose 70 - 99 mg/dL 114(H) 155(H) 152(H)  BUN 8 - 23 mg/dL 24(H) 19 31(H)  Creatinine 0.61 - 1.24 mg/dL 1.02 0.99 1.11  Sodium 135 - 145 mmol/L 141 140 140  Potassium 3.5 - 5.1 mmol/L 3.8 4.1 4.8  Chloride 98 - 111 mmol/L 106 104 106  CO2 22 - 32 mmol/L 27 25 27   Calcium 8.9 - 10.3 mg/dL 9.9 9.4 9.7  Total Protein 6.5 - 8.1 g/dL 7.2 7.7 7.3  Total Bilirubin 0.3 - 1.2 mg/dL 0.7 0.8 0.6  Alkaline Phos 38 - 126 U/L 55 58 63  AST 15 - 41 U/L 23 22 22   ALT 0 - 44 U/L 18 18 17    CBC Latest Ref Rng & Units 10/08/2018 04/06/2018 10/06/2017  WBC 4.0 - 10.5 K/uL 6.9 6.5 6.5  Hemoglobin 13.0 - 17.0 g/dL 14.2 14.2 13.6  Hematocrit 39.0 - 52.0 % 42.3 43.4 40.5  Platelets 150 - 400 K/uL 166 153 166   Cardiac Studies:   EKG  11/27/2018:Sinus rhythm with borderline first-degree AV block at the rate of 59 bpm, normal axis, incomplete right bundle branch block.  Poor R-wave progression, probably normal variant.  No evidence of ischemia.  03/30/2019: Sinus bradycardia with a ventricular rate of 59 bpm, first-degree AV block, incomplete right bundle branch block right axis deviation cannot entirely rule out lateral wall infarct, without injury pattern.   Cardiac Cath: 04/05/10: 3 Vessel CAD; RCA 99% mid; Mid Cx; 80%, Ostial LAD 80%, Mid 90% S/P CABG  x5  04/09/10  LIMA to the LAD, SVG to D1, SVG to OM1 & OM3, SVG to Rt PDA.  Lexiscan stress 04/05/11: Normal perfusion without ischemia, EF 63%. Compared to  03/30/10: Inferior ischemia not present.  Carotid duplex 09/07/2012: No evidence of hemodynamically significant stenosis in the bilateral carotid bifurcation vessels.  There is evidence of heterogeneous plaque in the bilateral carotid artery.  Mild  bilateral intimal thickening. Left carotid endarterectomy is widely patient. No significant change since 09/14/11.  Assessment     ICD-10-CM   1. Coronary artery disease involving native coronary artery of native heart without angina pectoris  I25.10 EKG 12-Lead    PCV ECHOCARDIOGRAM COMPLETE    PCV MYOCARDIAL PERFUSION WITH LEXISCAN    nitroGLYCERIN (NITROSTAT) 0.4 MG SL tablet    Lipid Panel With LDL/HDL Ratio    HgB A1c  2. Hx of CABG  Z95.1 PCV ECHOCARDIOGRAM COMPLETE    PCV MYOCARDIAL PERFUSION WITH LEXISCAN    nitroGLYCERIN (NITROSTAT) 0.4 MG SL tablet    Lipid Panel With LDL/HDL Ratio    HgB A1c  3. OSA on CPAP  G47.33    Z99.89   4. Mixed hyperlipidemia  E78.2   5. HTN (hypertension), benign  I10 losartan (COZAAR) 50 MG tablet    hydrochlorothiazide (MICROZIDE) 12.5 MG capsule    amLODipine (NORVASC) 5 MG tablet    Basic Metabolic Panel (BMET)    Magnesium    Pro b natriuretic peptide (BNP)[facility])  6. Former smoker  Z87.891       Recommendations:  [name] [name]  is a 86 y.o. male who is past  medical history and cardiovascular risk factors include: Established coronary disease with CABG in 2012, hypertension, mixed hyperlipidemia, hyperglycemia, h/o gout, OSA on CPAP, former smoker, morbid obesity.  Precordial chest pain:  Patient symptoms of chest discomfort are not completely suggestive of anginal equivalent but mostly secondary to uncontrolled hypertension.  Currently patient is not having active chest pain or shortness of breath at rest or with effort related activities.  EKG shows concern for possible lateral wall infarct without underlying injury pattern.  Patient in the presence of his wife is educated extensively to seek medical attention sooner at the closest ER via EMS if he has chest discomfort given his multiple cardiovascular risk factors progressive CAD.  Patient verbalized understanding.  However, in the setting of established  coronary artery disease and prior surgical revascularization would recommend an ischemic evaluation for further with stratification.  Plan echocardiogram to evaluate for LV function and structural heart disease.  Plan nuclear stress test to rule out underlying ischemia.  Education importance of risk factor modifications with discussions in regards to better blood pressure management lipid management and being evaluated for diabetes.  Check fasting lipid profile.  Check hemoglobin A1c  Continue aspirin 81 mg p.o. daily.  Continue beta-blocker therapy.  We will transition lisinopril/hydrochlorothiazide to losartan and hydrochlorothiazide.  Patient also been given a prescription for sublingual nitroglycerin tablets and he as well as his wife is aware that he cannot take phosphodiesterase 5 inhibitors in conjunction as they are multiple drug to drug interactions.  Medication profile discussed and administration instructions discussed  Established coronary artery disease with prior surgical revascularization: See above  Mixed hyperlipidemia:  Check fasting lipid profile.  Continue statin therapy.    Patient does not endorse any evidence of myalgias.  Hypertension:  Patient is educated on the importance of better blood pressure management.  Patient is notified that a home systolic blood pressure averaging 150 mmHg is not considered controlled.  Discontinue lisinopril/hydrochlorothiazide.  Transition lisinopril to losartan 50 mg p.o. every afternoon and will uptitrate as needed.  Continue hydrochlorothiazide 12.5 mg p.o. every morning.  Dose is not increased due to his underlying gout.  Add Norvasc 5 mg p.o. every morning  Patient is asked to keep a log of his blood pressures and to bring it in at the next office visit.  In the meantime if his systolic blood pressures consistently are greater than 140 mmHg despite the addition and change of medical regimen he is asked to call the  office sooner for further medication titration.  Educated on the importance of a low-salt diet.  Obesity, due to excess calories.  Body mass index is 40.84 kg/m.  Educated on the importance of lifestyle modification, improving modifiable cardiac risk factors, and once ischemic work-up is complete would recommend 30 minutes of moderate intensity exercise 5 days a week or as tolerated.  Former smoker: Educated importance of continued smoking cessation.  Total time spent with patient was 60 minutes and greater than 50% of that time was spent in counseling and coordination care with the patient regarding complex decision making and discussion as state above.  Rex Kras, DO, [facility]. [place] Office: [phone]

## 2019-03-30 NOTE — Telephone Encounter (Signed)
Patient is scheduled today at 3:00 as discussed. Thanks!

## 2019-03-30 NOTE — Patient Instructions (Signed)
Please remember to bring in your medication bottles in at the next visit.   Please keep a blood pressure log and bring that in at  your next office visit. Call the office if the top number is consistently greater than 132mmHg.   New Medications that were added at today's visit:  Losartan 50mg  po qPM HCTZ 12.5mg  po every AM Norvac 5mg  po qAM  Medications that were discontinued at today's visit: Lisinopril / HCTZ  Office will call you to have the following tests scheduled:  Echo Stress Test  Please get labs done in  1 week (fasting) at the nearest [place] follow up with your PCP as scheduled.

## 2019-04-02 ENCOUNTER — Other Ambulatory Visit: Payer: Self-pay | Admitting: *Deleted

## 2019-04-02 DIAGNOSIS — D472 Monoclonal gammopathy: Secondary | ICD-10-CM

## 2019-04-05 ENCOUNTER — Other Ambulatory Visit: Payer: Self-pay

## 2019-04-05 ENCOUNTER — Inpatient Hospital Stay: Payer: Medicare PPO | Attending: Oncology

## 2019-04-05 ENCOUNTER — Other Ambulatory Visit: Payer: Self-pay | Admitting: Oncology

## 2019-04-05 DIAGNOSIS — Z87891 Personal history of nicotine dependence: Secondary | ICD-10-CM | POA: Diagnosis not present

## 2019-04-05 DIAGNOSIS — Z881 Allergy status to other antibiotic agents status: Secondary | ICD-10-CM | POA: Diagnosis not present

## 2019-04-05 DIAGNOSIS — D472 Monoclonal gammopathy: Secondary | ICD-10-CM

## 2019-04-05 DIAGNOSIS — I25119 Atherosclerotic heart disease of native coronary artery with unspecified angina pectoris: Secondary | ICD-10-CM | POA: Diagnosis not present

## 2019-04-05 DIAGNOSIS — Z885 Allergy status to narcotic agent status: Secondary | ICD-10-CM | POA: Insufficient documentation

## 2019-04-05 DIAGNOSIS — Z818 Family history of other mental and behavioral disorders: Secondary | ICD-10-CM | POA: Insufficient documentation

## 2019-04-05 DIAGNOSIS — G4730 Sleep apnea, unspecified: Secondary | ICD-10-CM | POA: Insufficient documentation

## 2019-04-05 DIAGNOSIS — Z79899 Other long term (current) drug therapy: Secondary | ICD-10-CM | POA: Diagnosis not present

## 2019-04-05 DIAGNOSIS — M8580 Other specified disorders of bone density and structure, unspecified site: Secondary | ICD-10-CM | POA: Diagnosis not present

## 2019-04-05 DIAGNOSIS — Z800 Family history of malignant neoplasm of digestive organs: Secondary | ICD-10-CM | POA: Insufficient documentation

## 2019-04-05 DIAGNOSIS — Z87442 Personal history of urinary calculi: Secondary | ICD-10-CM | POA: Insufficient documentation

## 2019-04-05 DIAGNOSIS — Z8249 Family history of ischemic heart disease and other diseases of the circulatory system: Secondary | ICD-10-CM | POA: Insufficient documentation

## 2019-04-05 DIAGNOSIS — R079 Chest pain, unspecified: Secondary | ICD-10-CM | POA: Diagnosis not present

## 2019-04-05 DIAGNOSIS — Z888 Allergy status to other drugs, medicaments and biological substances status: Secondary | ICD-10-CM | POA: Diagnosis not present

## 2019-04-05 LAB — CMP (CANCER CENTER ONLY)
ALT: 15 U/L (ref 0–44)
AST: 20 U/L (ref 15–41)
Albumin: 3.8 g/dL (ref 3.5–5.0)
Alkaline Phosphatase: 63 U/L (ref 38–126)
Anion gap: 6 (ref 5–15)
BUN: 27 mg/dL — ABNORMAL HIGH (ref 8–23)
CO2: 28 mmol/L (ref 22–32)
Calcium: 10 mg/dL (ref 8.9–10.3)
Chloride: 105 mmol/L (ref 98–111)
Creatinine: 1.11 mg/dL (ref 0.61–1.24)
GFR, Est AFR Am: >60 mL/min (ref >60)
GFR, Estimated: >60 mL/min (ref >60)
Glucose, Bld: 113 mg/dL — ABNORMAL HIGH (ref 70–99)
Potassium: 4.4 mmol/L (ref 3.5–5.1)
Sodium: 139 mmol/L (ref 135–145)
Total Bilirubin: 0.8 mg/dL (ref 0.3–1.2)
Total Protein: 7.4 g/dL (ref 6.5–8.1)

## 2019-04-05 LAB — CBC WITH DIFFERENTIAL (CANCER CENTER ONLY)
Abs Immature Granulocytes: 0.01 10*3/uL (ref 0.00–0.07)
Basophils Absolute: 0 10*3/uL (ref 0.0–0.1)
Basophils Relative: 0 %
Eosinophils Absolute: 0.1 10*3/uL (ref 0.0–0.5)
Eosinophils Relative: 2 %
HCT: 43.9 % (ref 39.0–52.0)
Hemoglobin: 14.9 g/dL (ref 13.0–17.0)
Immature Granulocytes: 0 %
Lymphocytes Relative: 35 %
Lymphs Abs: 2.4 10*3/uL (ref 0.7–4.0)
MCH: 32 pg (ref 26.0–34.0)
MCHC: 33.9 g/dL (ref 30.0–36.0)
MCV: 94.2 fL (ref 80.0–100.0)
Monocytes Absolute: 0.7 10*3/uL (ref 0.1–1.0)
Monocytes Relative: 10 %
Neutro Abs: 3.6 10*3/uL (ref 1.7–7.7)
Neutrophils Relative %: 53 %
Platelet Count: 164 10*3/uL (ref 150–400)
RBC: 4.66 MIL/uL (ref 4.22–5.81)
RDW: 15.2 % (ref 11.5–15.5)
WBC Count: 6.8 10*3/uL (ref 4.0–10.5)
nRBC: 0 % (ref 0.0–0.2)

## 2019-04-06 ENCOUNTER — Telehealth: Payer: Self-pay | Admitting: Pharmacist

## 2019-04-06 ENCOUNTER — Other Ambulatory Visit: Payer: Self-pay | Admitting: Cardiology

## 2019-04-06 DIAGNOSIS — I10 Essential (primary) hypertension: Secondary | ICD-10-CM

## 2019-04-06 NOTE — Telephone Encounter (Signed)
Pharmacist called to review BP reading. LVM for pt to CB

## 2019-04-06 NOTE — Progress Notes (Signed)
[facility]  [phone] [phone]     ID: LYMON KIDNEY DOB: 18-Jan-1933  MR#: [id number]  WUX#:[id number]  Patient Care Team: Tamsen Roers, MD as PCP - General (Family Medicine) Deneise Lever, MD as Consulting Physician (Pulmonary Disease) Allyn Kenner, MD (Dermatology) Niclas Markell, Virgie Dad, MD as Consulting Physician (Oncology) Adrian Prows, MD as Consulting Physician (Cardiology) Chauncey Cruel, MD OTHER MD:  CHIEF COMPLAINT: M-GUS,   CURRENT TREATMENT: prolia   INTERVAL HISTORY: Mr. Hackler returns today for follow up of his M-GUS.  We are checking labs every 6 months at this point.  These are reviewed below  He continues on Prolia, with a dose due today.  He has had no side effects or complications from this treatment.  He tells me his teeth are fine and that he sees his dentist every few months.   REVIEW OF SYSTEMS: Pearley was sitting at home watching TV and developed chest pain consistent with angina.  He is scheduled for stress test today shortly after receiving his Prolia here today.  Aside from that he tells me h he is doing well and he has had both his COVID-19 doses with no complications.   HISTORY OF CURRENT ILLNESS: From the original intake note, 06/17/2011:   Mr. Langlinais fell in 06/2009, and was evaluated in the emergency room with plain films of the lumbar spine, which showed no acute abnormality.  A week later he presented with intractable pain to Dr. Gladstone Lighter, and had to be admitted for further evaluation and for pain control.  Films on 07/02 showed a new compression deformity of T12.  There was no obvious retropulsion.  There was diffuse osteopenia, and a possible radiolucent lesion was noted in the left femur.  This was further evaluated on 07/05, and no radiolucent lesion was seen by CT scan.  The patient has had a fairly protracted course since that time.  He was at [facility] for a while.  He is now back home.   Uses a walker, and he gets around fairly well with that according to the wife.  Further evaluation is planned through Dr. Gladstone Lighter and Dr. Melina Schools.  As part of his workup in the hospital in early July, the patient had an immunofixation which showed slightly low IgA at 50, slightly low IgM at 34, and a normal IgG at 1400 with a monoclonal IgG kappa paraprotein.  The M-spike at that time, on 07/02, was 0.84 g/dL.  Urine immunofixation showed the IgG kappa, but no Bence-Jones proteins.  Free kappa light chains were 2.16, free lambda light chains 0.10. His subsequent history is as detailed below.  The patient's subsequent history is as detailed below.   PAST MEDICAL HISTORY: Past Medical History:  Diagnosis Date  . Complication of anesthesia    Pt difficult to intubate; 07/19/03 aborted surgery unable to intubate  . Coronary artery disease   . Diabetes mellitus without complication ([place])   . Dysthymic disorder   . Gout    Hx: of in left wrist  . Hearing deficit    Hx: of wears hearing aids  . Hypertension   . Kidney stones    Hx: of  . Multiple myeloma ([place])   . Occlusion and stenosis of unspecified carotid artery   . Pathologic fracture of vertebrae   . Pneumonia   . Rosacea   . Sleep apnea    Hx: of pt wears CPAP    PAST SURGICAL HISTORY: Past Surgical  History:  Procedure Laterality Date  . APPENDECTOMY    . BACK SURGERY  [phone]   lumb  . CARDIAC CATHETERIZATION     Hx: of 04/05/10  . CAROTID ARTERY - SUBCLAVIAN ARTERY BYPASS GRAFT  2004   Hx: of-left  . CARPAL TUNNEL RELEASE Right 04/13/2013   Procedure: RIGHT CARPAL TUNNEL RELEASE;  Surgeon: Cammie Sickle., MD;  Location: [facility];  Service: Orthopedics;  Laterality: Right;  . CATARACT EXTRACTION     Hx: of left eye only  . COLONOSCOPY    . COLONOSCOPY W/ BIOPSIES AND POLYPECTOMY     Hx: of  . CORONARY ARTERY BYPASS GRAFT  2012   Hx: of 5  . ELBOW SURGERY  1992   Hx: of-lt  . HERNIA  REPAIR  1963   Hx: of right  . JOINT REPLACEMENT  2006   Hx: of left knee  . LUMBAR LAMINECTOMY/DECOMPRESSION MICRODISCECTOMY N/A 11/18/2012   Procedure: REVISION L2 - L4 DECOMPRESSION DISCECTOMY 2 LEVELS;  Surgeon: Melina Schools, MD;  Location: [facility];  Service: Orthopedics;  Laterality: N/A;  . ROTATOR CUFF REPAIR  2003   Hx: of right  . TONSILLECTOMY    . TRIGGER FINGER RELEASE Right 04/13/2013   Procedure: RIGHT THUMB A-1 PULLEY RELEASE;  Surgeon: Cammie Sickle., MD;  Location: [facility];  Service: Orthopedics;  Laterality: Right;    FAMILY HISTORY Family History  Problem Relation Age of Onset  . Cancer - Colon Mother   . Heart disease Father   . Alzheimer's disease Brother   . Alzheimer's disease Sister   . Alzheimer's disease Brother   . Dementia Other    The patient's mother died of colon cancer at age 18. The patient's father died of a heart attack at age 23. The patient had 1 brother and 2 sisters. The patient's older brother and sister died of Alzheimer's. The patient's nephew (son of the brother who died of Alzheimer's) had a sinus cancer.    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 He has been married to [place] for 59+ years. He has one daughter, Soul Hackman who lives in [place] and works for the school system. He has 1 grandchild and no great grandchildren. He belongs to [employer].     HEALTH MAINTENANCE: Social History   Tobacco Use  . Smoking status: Former Smoker    Packs/day: 0.50    Years: 30.00    Pack years: 15.00    Types: Cigarettes    Quit date: 04/08/1978    Years since quitting: 41.0  . Smokeless tobacco: Never Used  . Tobacco comment: Quit smoking cigarretes in 1980  Substance Use Topics  . Alcohol use: No    Alcohol/week: 0.0 standard drinks  . Drug use: No      Allergies  Allergen Reactions  . Ciprofloxacin   . Hydrocodone  Nausea Only    Ok taking in low doses('5mg'$ ), has nausea with higher doses  . Levofloxacin Other (See Comments)    Pain/cramping   . Oxycodone Nausea Only  . Tramadol Nausea Only  . Cardizem [Diltiazem Hcl] Rash    Current Outpatient Medications  Medication Sig Dispense Refill  . allopurinol (ZYLOPRIM) 300 MG tablet Take 300 mg by mouth daily.     [name] amLODipine (NORVASC) 5 MG tablet Take 1 tablet (5 mg total) by mouth in the morning. 180 tablet 1  .  amoxicillin (AMOXIL) 500 MG capsule TAKE 4 CAPSULES 1 HOUR BEFORE APPOINTMENT    . aspirin EC 81 MG tablet Take 81 mg by mouth daily.    . bisoprolol (ZEBETA) 10 MG tablet Take 10 mg by mouth every morning.     . cromolyn (OPTICROM) 4 % ophthalmic solution PLACE 1 DROP INTO BOTH EYES 2 TO 4 TIMES A DAY  2  . denosumab (PROLIA) 60 MG/ML SOSY injection Every 6 months    . fexofenadine (ALLEGRA) 180 MG tablet Take 180 mg by mouth daily as needed (for allergies).     . fluticasone (FLONASE) 50 MCG/ACT nasal spray as needed.    . gabapentin (NEURONTIN) 100 MG capsule as needed.    . hydrochlorothiazide (MICROZIDE) 12.5 MG capsule Take 1 capsule (12.5 mg total) by mouth in the morning. 90 capsule 1  . levothyroxine (SYNTHROID, LEVOTHROID) 100 MCG tablet Take 100 mcg by mouth daily before breakfast.    . losartan (COZAAR) 50 MG tablet Take 1 tablet (50 mg total) by mouth every evening. 90 tablet 1  . Multiple Vitamin (MULTIVITAMIN) tablet Take 1 tablet by mouth daily.    . Multiple Vitamins-Minerals (PRESERVISION AREDS PO) daily.    . nitroGLYCERIN (NITROSTAT) 0.4 MG SL tablet Place 1 tablet (0.4 mg total) under the tongue every 5 (five) minutes as needed for chest pain. If your symptoms continue after taking two tablets 5 minutes apart call 911. 90 tablet 3  . Omega-3 Fatty Acids (FISH OIL) 1000 MG CAPS Take by mouth 4 (four) times daily.     Vladimir Faster Glycol-Propyl Glycol (SYSTANE OP) Apply 1 drop to eye 2 (two) times daily. Each eye    .  rosuvastatin (CRESTOR) 20 MG tablet Take 20 mg by mouth at bedtime.    . Vitamin D, Ergocalciferol, (DRISDOL) 50000 UNITS CAPS Take 50,000 Units by mouth every 7 (seven) days.      No current facility-administered medications for this visit.    OBJECTIVE: Morbidly obese white man in no acute distress  Vitals:   04/07/19 1055  BP: (!) 173/65  Pulse: (!) 56  Resp: 18  Temp: 98.3 F (36.8 C)  SpO2: 98%     Body mass index is 40.27 kg/m.   Wt Readings from Last 3 Encounters:  04/07/19 249 lb 8 oz (113.2 kg)  03/30/19 253 lb (114.8 kg)  11/27/18 245 lb 11.2 oz (111.4 kg)      ECOG FS:1 - Symptomatic but completely ambulatory  Sclerae unicteric, EOMs intact Wearing a mask No cervical or supraclavicular adenopathy Lungs no rales or rhonchi Heart regular rate and rhythm Abd soft, nontender, positive bowel sounds Neuro: nonfocal, well oriented, positive affect   LAB RESULTS:   CMP     Component Value Date/Time   NA 139 04/05/2019 1112   NA 142 06/09/2012 0944   K 4.4 04/05/2019 1112   K 4.1 06/09/2012 0944   CL 105 04/05/2019 1112   CL 105 06/09/2012 0944   CO2 28 04/05/2019 1112   CO2 27 06/09/2012 0944   GLUCOSE 113 (H) 04/05/2019 1112   GLUCOSE 148 (H) 06/09/2012 0944   BUN 27 (H) 04/05/2019 1112   BUN 24.1 06/09/2012 0944   CREATININE 1.11 04/05/2019 1112   CREATININE 1.1 06/09/2012 0944   CALCIUM 10.0 04/05/2019 1112   CALCIUM 9.3 06/09/2012 0944   PROT 7.4 04/05/2019 1112   PROT 7.0 06/09/2012 0944   ALBUMIN 3.8 04/05/2019 1112   ALBUMIN 3.4 (L) 06/09/2012 7096  AST 20 04/05/2019 1112   AST 20 06/09/2012 0944   ALT 15 04/05/2019 1112   ALT 20 06/09/2012 0944   ALKPHOS 63 04/05/2019 1112   ALKPHOS 50 06/09/2012 0944   BILITOT 0.8 04/05/2019 1112   BILITOT 0.91 06/09/2012 0944   GFRNONAA >60 04/05/2019 1112   GFRAA >60 04/05/2019 1112    Lab Results  Component Value Date   TOTALPROTELP 6.6 10/08/2018   ALBUMINELP 3.4 09/01/2017   A1GS 0.2  09/01/2017   A2GS 0.9 09/01/2017   BETS 0.9 09/01/2017   BETA2SER 3.4 06/09/2012   GAMS 1.4 09/01/2017   MSPIKE 0.8 (H) 09/01/2017   SPEI Comment 09/01/2017     Lab Results  Component Value Date   KPAFRELGTCHN 29.2 (H) 04/05/2019   LAMBDASER 10.4 04/05/2019   KAPLAMBRATIO 2.81 (H) 04/05/2019    Lab Results  Component Value Date   WBC 6.8 04/05/2019   NEUTROABS 3.6 04/05/2019   HGB 14.9 04/05/2019   HCT 43.9 04/05/2019   MCV 94.2 04/05/2019   PLT 164 04/05/2019   No results found for: LABCA2  No components found for: TSVXBL390  No results for input(s): INR in the last 168 hours.  No results found for: LABCA2  No results found for: ZES923  No results found for: RAQ762  No results found for: UQJ335  No results found for: CA2729  No components found for: HGQUANT  No results found for: CEA1 / No results found for: CEA1   No results found for: AFPTUMOR  No results found for: CHROMOGRNA  No results found for: PSA1  No results found for: HGBA, HGBA2QUANT, HGBFQUANT, HGBSQUAN (Hemoglobinopathy evaluation)   Lab Results  Component Value Date   LDH 157 10/08/2018    No results found for: IRON, TIBC, IRONPCTSAT (Iron and TIBC)  No results found for: FERRITIN  Urinalysis    Component Value Date/Time   COLORURINE YELLOW 04/06/2010 1832   APPEARANCEUR CLEAR 04/06/2010 1832   LABSPEC 1.009 04/06/2010 1832   PHURINE 7.5 04/06/2010 1832   GLUCOSEU NEGATIVE 04/06/2010 1832   HGBUR NEGATIVE 04/06/2010 1832   BILIRUBINUR NEGATIVE 04/06/2010 1832   KETONESUR NEGATIVE 04/06/2010 1832   PROTEINUR NEGATIVE 04/06/2010 1832   UROBILINOGEN 0.2 04/06/2010 1832   NITRITE NEGATIVE 04/06/2010 1832   LEUKOCYTESUR NEGATIVE 04/06/2010 1832    STUDIES: No results found.    ELIGIBLE FOR AVAILABLE RESEARCH PROTOCOL: no  ASSESSMENT: 86 y.o. [place], [place] man with a small M spike first noted July 2011 and stable since that time, consistent with M-GUS  1.  Skeletal survey on 09/25/2017 equivocal and consistent with osteoporosis  (a) Prolia beginning on 10/07/2017, repeated every 6 months   PLAN: Mearl is doing fine as far as his MGUS is concerned although I do not yet have repeat SPEP.  At least kappa lambda ratio is stable and there are no cytopenias.  He receives Prolia every 6 months.  He will have another dose in September and then we may discontinue that when he returns to see me March of next year  I am concerned about his new angina history.  He has significant history of coronary artery disease.  This is being appropriately followed  He knows to call for any other issue that may develop before the next visit.  Total encounter time 20 minutes.  Virgie Dad. Elim Peale, MD 04/07/19 11:29 AM Medical Oncology and Hematology [facility] [place], [place] [address] Tel. [phone]    Fax. [phone]  I, Wilburn Mylar, am acting as scribe for Dr. Sarajane Jews C. Alette Kataoka.  I, Lurline Del MD, have reviewed the above documentation for accuracy and completeness, and I agree with the above.   *Total Encounter Time as defined by the Centers for Medicare and Medicaid Services includes, in addition to the face-to-face time of a patient visit (documented in the note above) non-face-to-face time: obtaining and reviewing outside history, ordering and reviewing medications, tests or procedures, care coordination (communications with other health care professionals or caregivers) and documentation in the medical record.

## 2019-04-06 NOTE — Telephone Encounter (Signed)
BP readings reviewed. BP elevated but trending down. BP readings of ~150-160/80s for both morning and evening readings. Called to discuss with pt and wife. Pt states that he is tolerating amlodipine, losartan, and HCTZ without any issues. No SE noted. No complains of chest pain, LE edema, palpitations, or hypotension of note. Recent lab results show that pt's lab remain stable. After discussing with Dr. Terri Skains, instructed pt to increase HCTZ to 25 mg in the morning and Losartan to 100 mg in the evening. Follow up BMP lab ordered and instructed pt to complete lab work prior to next [facility] on 3/17.

## 2019-04-07 ENCOUNTER — Ambulatory Visit: Payer: Medicare PPO

## 2019-04-07 ENCOUNTER — Telehealth: Payer: Self-pay | Admitting: Oncology

## 2019-04-07 ENCOUNTER — Ambulatory Visit: Payer: Medicare Other

## 2019-04-07 ENCOUNTER — Other Ambulatory Visit: Payer: Self-pay

## 2019-04-07 ENCOUNTER — Ambulatory Visit: Payer: Medicare Other | Admitting: Oncology

## 2019-04-07 ENCOUNTER — Inpatient Hospital Stay: Payer: Medicare PPO | Admitting: Oncology

## 2019-04-07 ENCOUNTER — Inpatient Hospital Stay: Payer: Medicare PPO

## 2019-04-07 VITALS — BP 158/62

## 2019-04-07 VITALS — BP 173/65 | HR 56 | Temp 98.3°F | Resp 18 | Ht 66.0 in | Wt 249.5 lb

## 2019-04-07 DIAGNOSIS — D472 Monoclonal gammopathy: Secondary | ICD-10-CM | POA: Diagnosis not present

## 2019-04-07 DIAGNOSIS — M818 Other osteoporosis without current pathological fracture: Secondary | ICD-10-CM

## 2019-04-07 DIAGNOSIS — Z951 Presence of aortocoronary bypass graft: Secondary | ICD-10-CM

## 2019-04-07 DIAGNOSIS — I2510 Atherosclerotic heart disease of native coronary artery without angina pectoris: Secondary | ICD-10-CM

## 2019-04-07 LAB — KAPPA/LAMBDA LIGHT CHAINS
Kappa free light chain: 32.1 mg/L — ABNORMAL HIGH (ref 3.3–19.4)
Kappa, lambda light chain ratio: 3 — ABNORMAL HIGH (ref 0.26–1.65)
Lambda free light chains: 10.7 mg/L (ref 5.7–26.3)

## 2019-04-07 MED ORDER — DENOSUMAB 60 MG/ML ~~LOC~~ SOSY
PREFILLED_SYRINGE | SUBCUTANEOUS | Status: AC
  Filled 2019-04-07: qty 1

## 2019-04-07 MED ORDER — DENOSUMAB 60 MG/ML ~~LOC~~ SOSY
60.0000 mg | PREFILLED_SYRINGE | Freq: Once | SUBCUTANEOUS | Status: AC
  Administered 2019-04-07: 60 mg via SUBCUTANEOUS

## 2019-04-07 NOTE — Telephone Encounter (Signed)
Scheduled appts per 3/10 los. Pt's wife confirmed new appt dates and times.

## 2019-04-07 NOTE — Patient Instructions (Signed)
Denosumab injection What is this medicine? DENOSUMAB (den oh sue mab) slows bone breakdown. Prolia is used to treat osteoporosis in women after menopause and in men, and in people who are taking corticosteroids for 6 months or more. Xgeva is used to treat a high calcium level due to cancer and to prevent bone fractures and other bone problems caused by multiple myeloma or cancer bone metastases. Xgeva is also used to treat giant cell tumor of the bone. This medicine may be used for other purposes; ask your health care provider or pharmacist if you have questions. COMMON BRAND NAME(S): Prolia, XGEVA What should I tell my health care provider before I take this medicine? They need to know if you have any of these conditions:  dental disease  having surgery or tooth extraction  infection  kidney disease  low levels of calcium or Vitamin D in the blood  malnutrition  on hemodialysis  skin conditions or sensitivity  thyroid or parathyroid disease  an unusual reaction to denosumab, other medicines, foods, dyes, or preservatives  pregnant or trying to get pregnant  breast-feeding How should I use this medicine? This medicine is for injection under the skin. It is given by a health care professional in a hospital or clinic setting. A special MedGuide will be given to you before each treatment. Be sure to read this information carefully each time. For Prolia, talk to your pediatrician regarding the use of this medicine in children. Special care may be needed. For Xgeva, talk to your pediatrician regarding the use of this medicine in children. While this drug may be prescribed for children as young as 13 years for selected conditions, precautions do apply. Overdosage: If you think you have taken too much of this medicine contact a poison control center or emergency room at once. NOTE: This medicine is only for you. Do not share this medicine with others. What if I miss a dose? It is  important not to miss your dose. Call your doctor or health care professional if you are unable to keep an appointment. What may interact with this medicine? Do not take this medicine with any of the following medications:  other medicines containing denosumab This medicine may also interact with the following medications:  medicines that lower your chance of fighting infection  steroid medicines like prednisone or cortisone This list may not describe all possible interactions. Give your health care provider a list of all the medicines, herbs, non-prescription drugs, or dietary supplements you use. Also tell them if you smoke, drink alcohol, or use illegal drugs. Some items may interact with your medicine. What should I watch for while using this medicine? Visit your doctor or health care professional for regular checks on your progress. Your doctor or health care professional may order blood tests and other tests to see how you are doing. Call your doctor or health care professional for advice if you get a fever, chills or sore throat, or other symptoms of a cold or flu. Do not treat yourself. This drug may decrease your body's ability to fight infection. Try to avoid being around people who are sick. You should make sure you get enough calcium and vitamin D while you are taking this medicine, unless your doctor tells you not to. Discuss the foods you eat and the vitamins you take with your health care professional. See your dentist regularly. Brush and floss your teeth as directed. Before you have any dental work done, tell your dentist you are   receiving this medicine. Do not become pregnant while taking this medicine or for 5 months after stopping it. Talk with your doctor or health care professional about your birth control options while taking this medicine. Women should inform their doctor if they wish to become pregnant or think they might be pregnant. There is a potential for serious side  effects to an unborn child. Talk to your health care professional or pharmacist for more information. What side effects may I notice from receiving this medicine? Side effects that you should report to your doctor or health care professional as soon as possible:  allergic reactions like skin rash, itching or hives, swelling of the face, lips, or tongue  bone pain  breathing problems  dizziness  jaw pain, especially after dental work  redness, blistering, peeling of the skin  signs and symptoms of infection like fever or chills; cough; sore throat; pain or trouble passing urine  signs of low calcium like fast heartbeat, muscle cramps or muscle pain; pain, tingling, numbness in the hands or feet; seizures  unusual bleeding or bruising  unusually weak or tired Side effects that usually do not require medical attention (report to your doctor or health care professional if they continue or are bothersome):  constipation  diarrhea  headache  joint pain  loss of appetite  muscle pain  runny nose  tiredness  upset stomach This list may not describe all possible side effects. Call your doctor for medical advice about side effects. You may report side effects to FDA at 1-800-FDA-1088. Where should I keep my medicine? This medicine is only given in a clinic, doctor's office, or other health care setting and will not be stored at home. NOTE: This sheet is a summary. It may not cover all possible information. If you have questions about this medicine, talk to your doctor, pharmacist, or health care provider.  2020 Elsevier/Gold Standard (2017-05-23 16:10:44)

## 2019-04-08 ENCOUNTER — Encounter: Payer: Self-pay | Admitting: Oncology

## 2019-04-08 LAB — PROTEIN ELECTROPHORESIS, SERUM
A/G Ratio: 1.3 (ref 0.7–1.7)
Albumin ELP: 4 g/dL (ref 2.9–4.4)
Alpha-1-Globulin: 0.2 g/dL (ref 0.0–0.4)
Alpha-2-Globulin: 0.8 g/dL (ref 0.4–1.0)
Beta Globulin: 0.9 g/dL (ref 0.7–1.3)
Gamma Globulin: 1.3 g/dL (ref 0.4–1.8)
Globulin, Total: 3.2 g/dL (ref 2.2–3.9)
M-Spike, %: 0.7 g/dL — ABNORMAL HIGH
Total Protein ELP: 7.2 g/dL (ref 6.0–8.5)

## 2019-04-12 ENCOUNTER — Ambulatory Visit: Payer: Medicare PPO

## 2019-04-12 ENCOUNTER — Other Ambulatory Visit: Payer: Self-pay

## 2019-04-12 DIAGNOSIS — I2510 Atherosclerotic heart disease of native coronary artery without angina pectoris: Secondary | ICD-10-CM

## 2019-04-12 DIAGNOSIS — Z951 Presence of aortocoronary bypass graft: Secondary | ICD-10-CM

## 2019-04-13 LAB — BASIC METABOLIC PANEL
BUN/Creatinine Ratio: 24 (ref 10–24)
BUN: 24 mg/dL (ref 8–27)
CO2: 25 mmol/L (ref 20–29)
Calcium: 9.9 mg/dL (ref 8.6–10.2)
Chloride: 101 mmol/L (ref 96–106)
Creatinine, Ser: 0.99 mg/dL (ref 0.76–1.27)
GFR calc Af Amer: 81 mL/min/{1.73_m2} (ref >59)
GFR calc non Af Amer: 70 mL/min/{1.73_m2} (ref >59)
Glucose: 193 mg/dL — ABNORMAL HIGH (ref 65–99)
Potassium: 4.4 mmol/L (ref 3.5–5.2)
Sodium: 139 mmol/L (ref 134–144)

## 2019-04-14 ENCOUNTER — Ambulatory Visit: Payer: Medicare PPO | Admitting: Cardiology

## 2019-04-14 ENCOUNTER — Other Ambulatory Visit: Payer: Self-pay | Admitting: Cardiology

## 2019-04-14 ENCOUNTER — Other Ambulatory Visit: Payer: Self-pay

## 2019-04-14 ENCOUNTER — Encounter: Payer: Self-pay | Admitting: Cardiology

## 2019-04-14 VITALS — BP 147/69 | HR 56 | Temp 97.8°F | Ht 66.5 in | Wt 249.8 lb

## 2019-04-14 DIAGNOSIS — G4733 Obstructive sleep apnea (adult) (pediatric): Secondary | ICD-10-CM

## 2019-04-14 DIAGNOSIS — I10 Essential (primary) hypertension: Secondary | ICD-10-CM

## 2019-04-14 DIAGNOSIS — Z87891 Personal history of nicotine dependence: Secondary | ICD-10-CM

## 2019-04-14 DIAGNOSIS — Z951 Presence of aortocoronary bypass graft: Secondary | ICD-10-CM

## 2019-04-14 DIAGNOSIS — Z712 Person consulting for explanation of examination or test findings: Secondary | ICD-10-CM

## 2019-04-14 DIAGNOSIS — E782 Mixed hyperlipidemia: Secondary | ICD-10-CM

## 2019-04-14 DIAGNOSIS — Z6841 Body Mass Index (BMI) 40.0 and over, adult: Secondary | ICD-10-CM

## 2019-04-14 DIAGNOSIS — I2510 Atherosclerotic heart disease of native coronary artery without angina pectoris: Secondary | ICD-10-CM

## 2019-04-14 MED ORDER — SPIRONOLACTONE 25 MG PO TABS: 12.5000 mg | ORAL_TABLET | Freq: Every morning | ORAL | 1 refills | Status: DC

## 2019-04-14 MED ORDER — AMLODIPINE BESYLATE 10 MG PO TABS: 10.0000 mg | ORAL_TABLET | Freq: Every evening | ORAL | 1 refills | Status: DC

## 2019-04-14 NOTE — Progress Notes (Signed)
Primary Physician/Referring:  Tamsen Roers, MD  Patient ID: [name] [name], male    DOB: 04-Apr-1932, 86 y.o.   MRN: [id number]  Chief Complaint  Patient presents with  . Coronary Artery Disease  . Hypertension  . Follow-up    testing    HPI: [name] [name]  is a 86 y.o. male his past medical history and cardiovascular risk factors include: Established coronary disease with CABG in 2012, hypertension, mixed hyperlipidemia, hyperglycemia, h/o gout, OSA on CPAP, former smoker, morbid obesity who presents to the office with chief complaint of " hypertension follow-up on test results".  Since last office visit patient no longer has chest discomfort.  He was given sublingual nitroglycerin tablets with last office visit but he has not needed them.  At the last office visit his blood pressures were significantly elevated.  Prior to seeing him his blood pressures were originally 220/185.  His medications were titrated at the last office visit and he had follow-up blood work in a week.  The blood work was reviewed and because the blood pressures were not at goal the medications were uptitrated.  Patient's hydrochlorothiazide was increased to 25 mg p.o. every morning and losartan was increased to 100 mg p.o. every afternoon.  Since the change in medications patient's wife states that the systolic blood pressure ranges between [id number]  and diastolic blood pressures are between 75 and 80 mmHg.  Both the patient and his wife are happy with the progress that he has made since last office visit.  Test results: Reviewed the echocardiogram results with the patient and his wife at today's visit.  Findings noted below for further reference.  Patient also underwent a nuclear stress test given his history of coronary artery disease and prior CABG in the setting of chest discomfort at the last office visit.  The stress test results were reviewed with the patient results noted below for further reference overall the  study was reported to be low risk.  Past Medical History:  Diagnosis Date  . Complication of anesthesia    Pt difficult to intubate; 07/19/03 aborted surgery unable to intubate  . Coronary artery disease   . Diabetes mellitus without complication ([place])   . Dysthymic disorder   . Gout    Hx: of in left wrist  . Hearing deficit    Hx: of wears hearing aids  . Hypertension   . Kidney stones    Hx: of  . Multiple myeloma ([place])   . Occlusion and stenosis of unspecified carotid artery   . Pathologic fracture of vertebrae   . Pneumonia   . Rosacea   . Sleep apnea    Hx: of pt wears CPAP    Past Surgical History:  Procedure Laterality Date  . APPENDECTOMY    . BACK SURGERY  [phone]   lumb  . CARDIAC CATHETERIZATION     Hx: of 04/05/10  . CAROTID ARTERY - SUBCLAVIAN ARTERY BYPASS GRAFT  2004   Hx: of-left  . CARPAL TUNNEL RELEASE Right 04/13/2013   Procedure: RIGHT CARPAL TUNNEL RELEASE;  Surgeon: Cammie Sickle., MD;  Location: [facility];  Service: Orthopedics;  Laterality: Right;  . CATARACT EXTRACTION     Hx: of left eye only  . COLONOSCOPY    . COLONOSCOPY W/ BIOPSIES AND POLYPECTOMY     Hx: of  . CORONARY ARTERY BYPASS GRAFT  2012   Hx: of 5  . ELBOW SURGERY  1992   Hx: of-lt  .  HERNIA REPAIR  1963   Hx: of right  . JOINT REPLACEMENT  2006   Hx: of left knee  . LUMBAR LAMINECTOMY/DECOMPRESSION MICRODISCECTOMY N/A 11/18/2012   Procedure: REVISION L2 - L4 DECOMPRESSION DISCECTOMY 2 LEVELS;  Surgeon: Melina Schools, MD;  Location: [facility];  Service: Orthopedics;  Laterality: N/A;  . ROTATOR CUFF REPAIR  2003   Hx: of right  . TONSILLECTOMY    . TRIGGER FINGER RELEASE Right 04/13/2013   Procedure: RIGHT THUMB A-1 PULLEY RELEASE;  Surgeon: Cammie Sickle., MD;  Location: [facility];  Service: Orthopedics;  Laterality: Right;    Social History   Socioeconomic History  . Marital status: Married    Spouse name: Not on file  . Number  of children: 1  . Years of education: college  . Highest education level: Not on file  Occupational History  . Occupation: Retired   Tobacco Use  . Smoking status: Former Smoker    Packs/day: 0.50    Years: 30.00    Pack years: 15.00    Types: Cigarettes    Quit date: 04/08/1978    Years since quitting: 41.0  . Smokeless tobacco: Never Used  . Tobacco comment: Quit smoking cigarretes in 1980  Substance and Sexual Activity  . Alcohol use: No    Alcohol/week: 0.0 standard drinks  . Drug use: No  . Sexual activity: Not on file  Other Topics Concern  . Not on file  Social History Narrative   Rare caffeine use    Social Determinants of Health   Financial Resource Strain:   . Difficulty of Paying Living Expenses:   Food Insecurity:   . Worried About Charity fundraiser in the Last Year:   . Arboriculturist in the Last Year:   Transportation Needs:   . Film/video editor (Medical):   [name] Lack of Transportation (Non-Medical):   Physical Activity:   . Days of Exercise per Week:   . Minutes of Exercise per Session:   Stress:   . Feeling of Stress :   Social Connections:   . Frequency of Communication with Friends and Family:   . Frequency of Social Gatherings with Friends and Family:   . Attends Religious Services:   . Active Member of Clubs or Organizations:   . Attends Archivist Meetings:   [name] Marital Status:   Intimate Partner Violence:   . Fear of Current or Ex-Partner:   . Emotionally Abused:   [name] Physically Abused:   . Sexually Abused:     Current Outpatient Medications on File Prior to Visit  Medication Sig Dispense Refill  . allopurinol (ZYLOPRIM) 300 MG tablet Take 300 mg by mouth daily.     [name] amoxicillin (AMOXIL) 500 MG capsule TAKE 4 CAPSULES 1 HOUR BEFORE APPOINTMENT    . aspirin EC 81 MG tablet Take 81 mg by mouth daily.    . bisoprolol (ZEBETA) 10 MG tablet Take 10 mg by mouth every morning.     . cetirizine (ZYRTEC) 10 MG tablet Take 10 mg by  mouth daily.    . cromolyn (OPTICROM) 4 % ophthalmic solution PLACE 1 DROP INTO BOTH EYES 2 TO 4 TIMES A DAY  2  . denosumab (PROLIA) 60 MG/ML SOSY injection Every 6 months    . fluticasone (FLONASE) 50 MCG/ACT nasal spray as needed.    . gabapentin (NEURONTIN) 100 MG capsule Take 100 mg by mouth as needed.     [name]  hydrochlorothiazide (HYDRODIURIL) 25 MG tablet Take 25 mg by mouth in the morning.    [name] levothyroxine (SYNTHROID, LEVOTHROID) 100 MCG tablet Take 100 mcg by mouth daily before breakfast.    . losartan (COZAAR) 100 MG tablet Take 100 mg by mouth daily.    . Multiple Vitamin (MULTIVITAMIN) tablet Take 1 tablet by mouth daily.    . Multiple Vitamins-Minerals (PRESERVISION AREDS PO) daily.    . nitroGLYCERIN (NITROSTAT) 0.4 MG SL tablet Place 1 tablet (0.4 mg total) under the tongue every 5 (five) minutes as needed for chest pain. If your symptoms continue after taking two tablets 5 minutes apart call 911. 90 tablet 3  . Omega-3 Fatty Acids (FISH OIL) 1000 MG CAPS Take by mouth 4 (four) times daily.     Vladimir Faster Glycol-Propyl Glycol (SYSTANE OP) Apply 1 drop to eye 2 (two) times daily. Each eye    . rosuvastatin (CRESTOR) 20 MG tablet Take 20 mg by mouth at bedtime.    . Vitamin D, Ergocalciferol, (DRISDOL) 50000 UNITS CAPS Take 50,000 Units by mouth every 7 (seven) days.      No current facility-administered medications on file prior to visit.   Review of Systems  Constitution: Negative for chills, decreased appetite and malaise/fatigue.  Eyes: Negative for blurred vision.  Cardiovascular: Negative for chest pain, dyspnea on exertion (chronic and stable), leg swelling, orthopnea, palpitations, paroxysmal nocturnal dyspnea and syncope.  Respiratory: Positive for snoring. Negative for shortness of breath (Chronic and stable).   Endocrine: Negative for cold intolerance.  Hematologic/Lymphatic: Does not bruise/bleed easily.  Musculoskeletal: Positive for arthritis and joint pain.  Negative for joint swelling.  Gastrointestinal: Negative for abdominal pain, anorexia and change in bowel habit.  Neurological: Negative for headaches, light-headedness and numbness.  Psychiatric/Behavioral: Negative for depression and substance abuse.  All other systems reviewed and are negative.     Objective   Vitals with BMI 04/14/2019 04/07/2019 04/07/2019  Height 5' 6.5" - '5\' 6"'$   Weight 249 lbs 13 oz - 249 lbs 8 oz  BMI 58.09 - 98.33  Systolic 825 053 976  Diastolic 69 62 65  Pulse 56 - 56    Physical Exam  Constitutional: He appears well-developed. No distress.  Morbidly obese  HENT:  Head: Atraumatic.  Eyes: Conjunctivae are normal.  Neck: No thyromegaly present.  Short neck and difficult to evaluate JVP  Cardiovascular: Normal rate, regular rhythm, normal heart sounds, intact distal pulses and normal pulses. Exam reveals no gallop.  No murmur heard. Pulses:      Carotid pulses are 2+ on the right side and 2+ on the left side. Femoral and popliteal pulse difficult to feel due to patient's body habitus.  2+ bilaterl ankle edema.  Pulmonary/Chest: Effort normal and breath sounds normal.  Abdominal: Soft. Bowel sounds are normal.  Obese. Pannus present  Musculoskeletal:        General: No edema. Normal range of motion.     Cervical back: Neck supple.  Neurological: He is alert.  Skin: Skin is warm and dry.  Psychiatric: He has a normal mood and affect.   Radiology: No results found.  Laboratory examination:    CMP Latest Ref Rng & Units 04/12/2019 04/05/2019 10/08/2018  Glucose 65 - 99 mg/dL 193(H) 113(H) 114(H)  BUN 8 - 27 mg/dL 24 27(H) 24(H)  Creatinine 0.76 - 1.27 mg/dL 0.99 1.11 1.02  Sodium 134 - 144 mmol/L 139 139 141  Potassium 3.5 - 5.2 mmol/L 4.4 4.4 3.8  Chloride 96 -  106 mmol/L 101 105 106  CO2 20 - 29 mmol/L '25 28 27  '$ Calcium 8.6 - 10.2 mg/dL 9.9 10.0 9.9  Total Protein 6.5 - 8.1 g/dL - 7.4 7.2  Total Bilirubin 0.3 - 1.2 mg/dL - 0.8 0.7  Alkaline  Phos 38 - 126 U/L - 63 55  AST 15 - 41 U/L - 20 23  ALT 0 - 44 U/L - 15 18   CBC Latest Ref Rng & Units 04/05/2019 10/08/2018 04/06/2018  WBC 4.0 - 10.5 K/uL 6.8 6.9 6.5  Hemoglobin 13.0 - 17.0 g/dL 14.9 14.2 14.2  Hematocrit 39.0 - 52.0 % 43.9 42.3 43.4  Platelets 150 - 400 K/uL 164 166 153   Cardiac Studies:   EKG  11/27/2018:Sinus rhythm with borderline first-degree AV block at the rate of 59 bpm, normal axis, incomplete right bundle branch block.  Poor R-wave progression, probably normal variant.  No evidence of ischemia.  03/30/2019: Sinus bradycardia with a ventricular rate of 59 bpm, first-degree AV block, incomplete right bundle branch block right axis deviation cannot entirely rule out lateral wall infarct, without injury pattern.   Echo: 04/07/2019: LVEF 55 to 60%, Moderate LVH, indeterminate diastolic filling pattern, moderately dilated left atrium, mild MR, mild TR, mild PR.  Cardiac Cath: 04/05/10: 3 Vessel CAD; RCA 99% mid; Mid Cx; 80%, Ostial LAD 80%, Mid 90% S/P CABG  x5  04/09/10  LIMA to the LAD, SVG to D1, SVG to OM1 & OM3, SVG to Rt PDA.  Lexiscan Tetrofosmin Stress Test  04/12/2019: Nondiagnostic ECG stress. Resting EKG demonstrated normal sinus rhythm, high lateral infarct, old. No ST-T wave abnormalities.   Left ventricle is normal in is mildly enlarged in both rest and stress images.  There is moderate diaphragmatic attenuation artifact in the inferior wall.  A very small sized mid inferolateral nontransmural scar cannot be completely excluded. Dynamic gated images reveal normal wall motion, stress LVEF was 60%. Low risk study.   Carotid duplex 09/07/2012: No evidence of hemodynamically significant stenosis in the bilateral carotid bifurcation vessels.  There is evidence of heterogeneous plaque in the bilateral carotid artery.  Mild bilateral intimal thickening. Left carotid endarterectomy is widely patient. No significant change since 09/14/11.  Assessment     ICD-10-CM     1. HTN (hypertension), benign  I10 amLODipine (NORVASC) 10 MG tablet    spironolactone (ALDACTONE) 25 MG tablet    Basic metabolic panel    Magnesium  2. Encounter to discuss test results  Z71.2   3. Coronary artery disease involving native coronary artery of native heart without angina pectoris  I25.10   4. Hx of CABG  Z95.1   5. OSA on CPAP  G47.33    Z99.89   6. Mixed hyperlipidemia  E78.2   7. Former smoker  Z87.891   61. Class 3 severe obesity due to excess calories with serious comorbidity and body mass index (BMI) of 40.0 to 44.9 in adult [facility])  E66.01    Z68.41       Recommendations:  JASHAN COTTEN  is a 86 y.o. male who is past medical history and cardiovascular risk factors include: Established coronary disease with CABG in 2012, hypertension, mixed hyperlipidemia, hyperglycemia, h/o gout, OSA on CPAP, former smoker, morbid obesity.  Precordial chest pain: Resolved.  Since last office visit patient has not had any chest discomfort.  He was given sublingual nitroglycerin tablets which he has not used.  Given his symptoms and multiple cardiovascular risk factors he underwent a  stress test findings were reviewed with both the patient and his wife and reported above for further reference.    Since his symptoms have improved with medical therapy, LV function is preserved, low risk nuclear stress test, no additional cardiovascular testing is needed at this time.    Established coronary artery disease with prior surgical revascularization: See above  Mixed hyperlipidemia:  Check fasting lipid profile.  Continue statin therapy.    Patient does not endorse any evidence of myalgias.  Hypertension:  Patient is educated on the importance of better blood pressure management.  Reviewed his home blood pressure log.  We will add Aldactone 12.5 mg p.o. every morning.  Medication profile reviewed including side effect such as gynecomastia or breast tissue tenderness.  We will  increase Norvasc to 10 mg p.o. every afternoon.  Patient will have repeat BMP and magnesium levels done in 1 week to evaluate kidney function and electrolytes.  Patient is encouraged to participate in moderate intensity activity and/or brisk walking for 30 minutes a day 5 days a week.  Educated on the importance of a low-salt diet.  Obesity, due to excess calories. Body mass index is 39.71 kg/m.  Educated on the importance of lifestyle modification, improving modifiable cardiac risk factors, and once ischemic work-up is complete would recommend 30 minutes of moderate intensity exercise 5 days a week or as tolerated.  Former smoker: Educated importance of continued smoking cessation.  Reviewed the results of the nuclear stress test and echocardiogram with the patient at today's office visit.  His and his wife's questions were addressed to their satisfaction.  Rex Kras, DO, [facility]. [place] Office: [phone]

## 2019-04-14 NOTE — Patient Instructions (Signed)
Please remember to bring in your medication bottles in at the next visit.   New Medications that were added at today's visit:  Increase amlodipine to 10 mg p.o. every afternoon. New medication: Spironolactone 12.5 mg p.o. every morning  Please get labs done in about 1 week after starting spironolactone at the nearest [facility].  Recommend follow up with your PCP as scheduled.

## 2019-04-19 ENCOUNTER — Other Ambulatory Visit: Payer: Self-pay | Admitting: Cardiology

## 2019-04-19 DIAGNOSIS — I10 Essential (primary) hypertension: Secondary | ICD-10-CM

## 2019-04-23 LAB — BASIC METABOLIC PANEL
BUN/Creatinine Ratio: 24 (ref 10–24)
BUN: 24 mg/dL (ref 8–27)
CO2: 25 mmol/L (ref 20–29)
Calcium: 9.4 mg/dL (ref 8.6–10.2)
Chloride: 104 mmol/L (ref 96–106)
Creatinine, Ser: 1.01 mg/dL (ref 0.76–1.27)
GFR calc Af Amer: 79 mL/min/{1.73_m2} (ref >59)
GFR calc non Af Amer: 68 mL/min/{1.73_m2} (ref >59)
Glucose: 117 mg/dL — ABNORMAL HIGH (ref 65–99)
Potassium: 5.1 mmol/L (ref 3.5–5.2)
Sodium: 142 mmol/L (ref 134–144)

## 2019-04-23 LAB — MAGNESIUM: Magnesium: 2.1 mg/dL (ref 1.6–2.3)

## 2019-05-11 NOTE — Telephone Encounter (Signed)
Okay.  Thanks.

## 2019-05-20 ENCOUNTER — Telehealth: Payer: Self-pay | Admitting: Pharmacist

## 2019-05-20 NOTE — Telephone Encounter (Signed)
BP readings reviewed. BP remains elevated and above goal. SBP remains around 150s. Pulse remains around 50s. Pt had recently increased HCTZ to 25 mg and losartan 100 mg and was able to pick up the refill from her pharmacy. Pt reports to be tolerating it well without any complains of ADRs. Med list reviewed and updated. Current antihypertensive medications include    AM: bisoprolol 10 mg, HCTZ 25 mg, spironolactone 12.5 mg   PM: amlodipine 10 mg, losartan 100 mg.   Most recent lab results from 3/25 show slight rise in K+ (5.1; previously 4.4), Scr and eGFR remain stable. May consider further titrating HCTZ to provide better BP control and maybe also ensure K+ levels remain WNL. Next OV scheduled with Dr. Einar Gip on 05/25/19. Will review with Dr. Einar Gip prior to next OV.

## 2019-05-24 NOTE — Progress Notes (Signed)
Primary Physician/Referring:  Tamsen Roers, MD  Patient ID: [name] [name], male    DOB: October 05, 1932, 86 y.o.   MRN: [id number]  No chief complaint on file.  HPI:    [name] [name]  is a 86 y.o. Caucasian male with coronary disease with CABG in 2012, hypertension, mixed hyperlipidemia, hyperglycemia, h/o gout, OSA on CPAP, former smoker, morbid obesity who presents to the office with chief complaint of "hypertension follow-up on test results".  Recently he has been evaluated for recurrent episodes of chest tightness and also uncontrolled hypertension and dyspnea.  He now presents for follow-up and states that since medication changes, his blood pressure is now much more stable and he has not had any further chest pain and dyspnea has remained stable.  Accompanied by his wife.  Past Medical History:  Diagnosis Date  . Complication of anesthesia    Pt difficult to intubate; 07/19/03 aborted surgery unable to intubate  . Coronary artery disease   . Diabetes mellitus without complication ([place])   . Dysthymic disorder   . Gout    Hx: of in left wrist  . Hearing deficit    Hx: of wears hearing aids  . Hypertension   . Kidney stones    Hx: of  . Multiple myeloma ([place])   . Occlusion and stenosis of unspecified carotid artery   . Pathologic fracture of vertebrae   . Pneumonia   . Rosacea   . Sleep apnea    Hx: of pt wears CPAP   Past Surgical History:  Procedure Laterality Date  . APPENDECTOMY    . BACK SURGERY  [phone]   lumb  . CARDIAC CATHETERIZATION     Hx: of 04/05/10  . CAROTID ARTERY - SUBCLAVIAN ARTERY BYPASS GRAFT  2004   Hx: of-left  . CARPAL TUNNEL RELEASE Right 04/13/2013   Procedure: RIGHT CARPAL TUNNEL RELEASE;  Surgeon: Cammie Sickle., MD;  Location: [place];  Service: Orthopedics;  Laterality: Right;  . CATARACT EXTRACTION     Hx: of left eye only  . COLONOSCOPY    . COLONOSCOPY W/ BIOPSIES AND POLYPECTOMY     Hx: of  . CORONARY ARTERY  BYPASS GRAFT  2012   Hx: of 5  . ELBOW SURGERY  1992   Hx: of-lt  . HERNIA REPAIR  1963   Hx: of right  . JOINT REPLACEMENT  2006   Hx: of left knee  . LUMBAR LAMINECTOMY/DECOMPRESSION MICRODISCECTOMY N/A 11/18/2012   Procedure: REVISION L2 - L4 DECOMPRESSION DISCECTOMY 2 LEVELS;  Surgeon: Melina Schools, MD;  Location: [place];  Service: Orthopedics;  Laterality: N/A;  . ROTATOR CUFF REPAIR  2003   Hx: of right  . TONSILLECTOMY    . TRIGGER FINGER RELEASE Right 04/13/2013   Procedure: RIGHT THUMB A-1 PULLEY RELEASE;  Surgeon: Cammie Sickle., MD;  Location: [place];  Service: Orthopedics;  Laterality: Right;   Family History  Problem Relation Age of Onset  . Cancer - Colon Mother   . Heart disease Father   . Alzheimer's disease Brother   . Alzheimer's disease Sister   . Alzheimer's disease Brother   . Dementia Other     Social History   Tobacco Use  . Smoking status: Former Smoker    Packs/day: 0.50    Years: 30.00    Pack years: 15.00    Types: Cigarettes    Quit date: 04/08/1978    Years since quitting: 41.1  .  Smokeless tobacco: Never Used  . Tobacco comment: Quit smoking cigarretes in 1980  Substance Use Topics  . Alcohol use: No    Alcohol/week: 0.0 standard drinks   Marital Status: Married  ROS  Review of Systems  Constitution: Negative for chills, decreased appetite, malaise/fatigue and weight gain.  Cardiovascular: Negative for dyspnea on exertion, leg swelling and syncope.  Respiratory: Positive for snoring.   Endocrine: Negative for cold intolerance.  Hematologic/Lymphatic: Does not bruise/bleed easily.  Musculoskeletal: Positive for arthritis and joint pain. Negative for joint swelling.  Gastrointestinal: Negative for abdominal pain, anorexia, change in bowel habit, hematochezia and melena.  Neurological: Negative for headaches and light-headedness.  Psychiatric/Behavioral: Negative for depression and substance abuse.   Objective   Blood pressure 140/62, pulse 60, temperature (!) 97.3 F (36.3 C), temperature source Temporal, resp. rate 17, height _0  (1.676 m), weight 252 lb (114.3 kg), SpO2 97 %.  Vitals with BMI 05/25/2019 04/14/2019 04/07/2019  Height _1  5' 6.5" -  Weight 252 lbs 249 lbs 13 oz -  BMI 81.27 51.70 -  Systolic 017 494 496  Diastolic 62 69 62  Pulse 60 56 -     Physical Exam  Constitutional:  Morbidly Obese  Neck:  Short neck difficult to evaluate JVP  Cardiovascular: Normal rate, regular rhythm, normal heart sounds and intact distal pulses. Exam reveals no gallop.  No murmur heard. Pulses:      Carotid pulses are 2+ on the right side and 2+ on the left side.      Dorsalis pedis pulses are 2+ on the right side and 2+ on the left side.       Posterior tibial pulses are 2+ on the right side and 2+ on the left side.  Femoral and popliteal pulse difficult to feel due to patient's body habitus.  2+ bilateral ankle edema.  No JVD.  Pulmonary/Chest: Effort normal and breath sounds normal. No accessory muscle usage. No respiratory distress.  Abdominal: Soft. Bowel sounds are normal.  Obese. Pannus present    Laboratory examination:   Recent Labs    04/05/19 1112 04/12/19 1341 04/22/19 1140  NA 139 139 142  K 4.4 4.4 5.1  CL 105 101 104  CO2 _2 GLUCOSE 113* 193* 117*  BUN 27* 24 24  CREATININE 1.11 0.99 1.01  CALCIUM 10.0 9.9 9.4  GFRNONAA >60 70 68  GFRAA >60 81 79   CrCl cannot be calculated (Patient's most recent lab result is older than the maximum 21 days allowed.).  CMP Latest Ref Rng & Units 04/22/2019 04/12/2019 04/05/2019  Glucose 65 - 99 mg/dL 117(H) 193(H) 113(H)  BUN 8 - 27 mg/dL 24 24 27(H)  Creatinine 0.76 - 1.27 mg/dL 1.01 0.99 1.11  Sodium 134 - 144 mmol/L 142 139 139  Potassium 3.5 - 5.2 mmol/L 5.1 4.4 4.4  Chloride 96 - 106 mmol/L 104 101 105  CO2 20 - 29 mmol/L _3 Calcium 8.6 - 10.2 mg/dL 9.4 9.9 10.0  Total Protein 6.5 - 8.1 g/dL - - 7.4  Total  Bilirubin 0.3 - 1.2 mg/dL - - 0.8  Alkaline Phos 38 - 126 U/L - - 63  AST 15 - 41 U/L - - 20  ALT 0 - 44 U/L - - 15   CBC Latest Ref Rng & Units 04/05/2019 10/08/2018 04/06/2018  WBC 4.0 - 10.5 K/uL 6.8 6.9 6.5  Hemoglobin 13.0 - 17.0 g/dL 14.9 14.2 14.2  Hematocrit 39.0 - 52.0 %  43.9 42.3 43.4  Platelets 150 - 400 K/uL 164 166 153   External labs:   Hemoglobin 14.900 04/05/2019 Platelets 164.000 04/05/2019  Creatinine, Serum 1.010 04/22/2019 Potassium 5.100 04/22/2019 ALT (SGPT) 15.000 04/05/2019  Medications and allergies   Allergies  Allergen Reactions  . Ciprofloxacin   . Hydrocodone Nausea Only    Ok taking in low doses(31m), has nausea with higher doses  . Levofloxacin Other (See Comments)    Pain/cramping   . Oxycodone Nausea Only  . Tramadol Nausea Only  . Cardizem [Diltiazem Hcl] Rash     Current Outpatient Medications  Medication Instructions  . acetaminophen (TYLENOL) 650 mg, Oral, Every 6 hours PRN  . allopurinol (ZYLOPRIM) 300 mg, Oral, Daily, In the morning  . amLODipine (NORVASC) 10 mg, Oral, Every evening  . aspirin EC 81 mg, Daily  . bisoprolol (ZEBETA) 10 mg,  Every morning - 10a  . CALCIUM CITRATE-VITAMIN D PO 1 tablet, Oral, Daily, Taking [employer] brand callcium 600 mg Vit D 246m dose  . cetirizine (ZYRTEC) 10 mg, Oral, Daily  . cromolyn (OPTICROM) 4 % ophthalmic solution PLACE 1 DROP INTO BOTH EYES 2 TO 4 TIMES A DAY  . denosumab (PROLIA) 60 MG/ML SOSY injection Every 6 months   . fluticasone (FLONASE) 50 MCG/ACT nasal spray As needed  . gabapentin (NEURONTIN) 100 mg, Oral, As needed, Taking it almost everyday currently  . hydrALAZINE (APRESOLINE) 25 mg, Oral, 3 times daily  . hydrochlorothiazide (HYDRODIURIL) 25 mg, Oral, Every morning  . HYDROcodone-acetaminophen (NORCO/VICODIN) 5-325 MG tablet tablet  . isosorbide dinitrate (ISORDIL) 30 mg, Oral, 3 times daily  . levothyroxine (SYNTHROID) 100 mcg, Daily before breakfast  . losartan (COZAAR) 100 mg, Oral,  Daily, Taking at night  . Multiple Vitamin (MULTIVITAMIN) tablet 1 tablet, Daily  . Multiple Vitamins-Minerals (PRESERVISION AREDS PO) 2 times daily  . nitroGLYCERIN (NITROSTAT) 0.4 mg, Sublingual, Every 5 min PRN, If your symptoms continue after taking two tablets 5 minutes apart call 911.  . Omega-3 Fatty Acids (FISH OIL) 1000 MG CAPS 2 capsules, Oral, 2 times daily  . Polyethyl Glycol-Propyl Glycol (SYSTANE OP) 1 drop, 2 times daily  . rosuvastatin (CRESTOR) 20 mg, Daily at bedtime  . spironolactone (ALDACTONE) 25 mg, Oral, Every morning  . testosterone enanthate (DELATESTRYL) 200 MG/ML injection Intramuscular, Every 14 days, For IM use only   . Vitamin D (Ergocalciferol) (DRISDOL) 50,000 Units, Every 7 days   Radiology:   No results found.  Cardiac Studies:   Cardiac Cath: 04/05/2010: 3 Vessel CAD; RCA 99% mid; Mid Cx; 80%, Ostial LAD 80%, Mid 90% S/P CABG  x5  04/09/10  LIMA to the LAD, SVG to D1, SVG to OM1 & OM3, SVG to Rt PDA.  Carotid duplex 09/07/2012: No evidence of hemodynamically significant stenosis in the bilateral carotid bifurcation vessels.  There is evidence of heterogeneous plaque in the bilateral carotid artery.  Mild bilateral intimal thickening. Left carotid endarterectomy is widely patient. No significant change since 09/14/11.  Echo 04/07/2019: LVEF 55 to 60%, Moderate LVH, indeterminate diastolic filling pattern, moderately dilated left atrium, mild MR, mild TR, mild PR.  Lexiscan Tetrofosmin Stress Test  04/12/2019: Nondiagnostic ECG stress. Resting EKG demonstrated normal sinus rhythm, high lateral infarct, old. No ST-T wave abnormalities.   Left ventricle is normal in is mildly enlarged in both rest and stress images.  There is moderate diaphragmatic attenuation artifact in the inferior wall.  A very small sized mid inferolateral nontransmural scar cannot be completely excluded. Dynamic gated  images reveal normal wall motion, stress LVEF was 60%. Low risk study.    EKG  11/27/2018:Sinus rhythm with borderline first-degree AV block at the rate of 59 bpm, normal axis, incomplete right bundle branch block.  Poor R-wave progression, probably normal variant.  No evidence of ischemia.   03/30/2019: Sinus bradycardia with a ventricular rate of 59 bpm, first-degree AV block, incomplete right bundle branch block right axis deviation cannot entirely rule out lateral wall infarct, without injury pattern.  Assessment     ICD-10-CM   1. HTN (hypertension), benign  I10 hydrALAZINE (APRESOLINE) 25 MG tablet    isosorbide dinitrate (ISORDIL) 30 MG tablet    spironolactone (ALDACTONE) 25 MG tablet    Basic metabolic panel    Basic metabolic panel  2. Coronary artery disease involving native coronary artery of native heart without angina pectoris  I25.10 isosorbide dinitrate (ISORDIL) 30 MG tablet  3. Class 3 severe obesity due to excess calories with serious comorbidity and body mass index (BMI) of 40.0 to 44.9 in adult (HCC)  E66.01    Z68.41   4. Chronic diastolic heart failure (HCC)  I50.32 Brain natriuretic peptide    Brain natriuretic peptide     Meds ordered this encounter  Medications  . hydrALAZINE (APRESOLINE) 25 MG tablet    Sig: Take 1 tablet (25 mg total) by mouth 3 (three) times daily.    Dispense:  90 tablet    Refill:  2  . isosorbide dinitrate (ISORDIL) 30 MG tablet    Sig: Take 1 tablet (30 mg total) by mouth 3 (three) times daily.    Dispense:  90 tablet    Refill:  2    Medications Discontinued During This Encounter  Medication Reason  . spironolactone (ALDACTONE) 25 MG tablet     Recommendations:   [name] [name]  is a 86 y.o. Caucasian male with coronary disease with CABG in 2012, hypertension, mixed hyperlipidemia, hyperglycemia, h/o gout, OSA on CPAP, former smoker, morbid obesity who presents to the office with chief complaint of "hypertension follow-up on test results".  Recently he has been evaluated for recurrent episodes of  chest tightness and also uncontrolled hypertension and dyspnea.  Recent stress test from March 2021 revealing no ischemia and echocardiogram essentially revealing moderate LVH and preserved LVEF.  Chest pain symptoms have resolved with aggressive control of hypertension.  I do not have his recent lipids, potassium level has been elevated, discussed with the patient to avoid eating excessive amount of banana and foods and also foods rich in potassium and will recheck his BMP in 2 to [redacted] weeks along with a BNP to establish a baseline.  Fortunately not in acute decompensated heart failure.  Blood pressure is much improved, but would like to further optimize, will add hydralazine 25 mg p.o. 3 times daily and isosorbide dinitrate 30 mg p.o. 3 times daily.  I would like to see him back in 6 weeks for follow-up.  Weight loss again discussed with the patient.  Adrian Prows, MD, [facility] 05/27/2019, 9:29 PM [facility] [place] Office: [phone]

## 2019-05-25 ENCOUNTER — Ambulatory Visit: Payer: Medicare PPO | Admitting: Cardiology

## 2019-05-25 ENCOUNTER — Encounter: Payer: Self-pay | Admitting: Cardiology

## 2019-05-25 ENCOUNTER — Other Ambulatory Visit: Payer: Self-pay

## 2019-05-25 VITALS — BP 140/62 | HR 60 | Temp 97.3°F | Resp 17 | Ht 66.0 in | Wt 252.0 lb

## 2019-05-25 DIAGNOSIS — I5032 Chronic diastolic (congestive) heart failure: Secondary | ICD-10-CM

## 2019-05-25 DIAGNOSIS — I10 Essential (primary) hypertension: Secondary | ICD-10-CM

## 2019-05-25 DIAGNOSIS — I2510 Atherosclerotic heart disease of native coronary artery without angina pectoris: Secondary | ICD-10-CM

## 2019-05-25 DIAGNOSIS — Z6841 Body Mass Index (BMI) 40.0 and over, adult: Secondary | ICD-10-CM

## 2019-05-25 MED ORDER — ISOSORBIDE DINITRATE 30 MG PO TABS: 30.0000 mg | ORAL_TABLET | Freq: Three times a day (TID) | ORAL | 2 refills | Status: DC

## 2019-05-25 MED ORDER — HYDRALAZINE HCL 25 MG PO TABS: 25.0000 mg | ORAL_TABLET | Freq: Three times a day (TID) | ORAL | 2 refills | Status: DC

## 2019-06-09 LAB — BASIC METABOLIC PANEL
BUN/Creatinine Ratio: 21 (ref 10–24)
BUN: 23 mg/dL (ref 8–27)
CO2: 25 mmol/L (ref 20–29)
Calcium: 10.2 mg/dL (ref 8.6–10.2)
Chloride: 102 mmol/L (ref 96–106)
Creatinine, Ser: 1.1 mg/dL (ref 0.76–1.27)
GFR calc Af Amer: 71 mL/min/{1.73_m2} (ref >59)
GFR calc non Af Amer: 61 mL/min/{1.73_m2} (ref >59)
Glucose: 107 mg/dL — ABNORMAL HIGH (ref 65–99)
Potassium: 4.8 mmol/L (ref 3.5–5.2)
Sodium: 139 mmol/L (ref 134–144)

## 2019-06-09 LAB — BRAIN NATRIURETIC PEPTIDE: BNP: 234.3 pg/mL — ABNORMAL HIGH (ref 0.0–100.0)

## 2019-06-11 ENCOUNTER — Other Ambulatory Visit: Payer: Self-pay | Admitting: Pharmacist

## 2019-06-11 DIAGNOSIS — I10 Essential (primary) hypertension: Secondary | ICD-10-CM

## 2019-06-11 MED ORDER — SPIRONOLACTONE 25 MG PO TABS: 25.0000 mg | ORAL_TABLET | Freq: Every morning | ORAL | 2 refills | Status: DC

## 2019-06-11 NOTE — Telephone Encounter (Signed)
Wife called requesting refills for pt's spironolactone. Recent dose increase from 12.5 mg to 25 mg daily and pharmacy requesting new Rx with updated dose. Wife noticed that pt was having increased swelling around his legs a few days ago. Improved since wearing compressions stockings and elevating his legs. Leg swelling improved and better today. Encouraged pt to continue wearing his compression stocking more regularly, elevating his feet, and reducing their salt intake. Will pend the new spironolactone dose for Dr. Einar Gip. Reports to be tolerating the dose increase well and without any ADRs of note. BP readings continue to remain stable and close to goal. Will continue to monitor and follow up as needed.

## 2019-07-02 ENCOUNTER — Telehealth: Payer: Self-pay | Admitting: Internal Medicine

## 2019-07-02 NOTE — Telephone Encounter (Signed)
Is this the correct pt??- if so needs ov b/c I can not tell when the last visit was it has been so long  LMTCB x 1

## 2019-07-06 ENCOUNTER — Other Ambulatory Visit: Payer: Self-pay

## 2019-07-06 ENCOUNTER — Ambulatory Visit (INDEPENDENT_AMBULATORY_CARE_PROVIDER_SITE_OTHER): Payer: Medicare PPO | Admitting: Internal Medicine

## 2019-07-06 ENCOUNTER — Telehealth: Payer: Self-pay | Admitting: Internal Medicine

## 2019-07-06 ENCOUNTER — Encounter: Payer: Self-pay | Admitting: Internal Medicine

## 2019-07-06 VITALS — BP 138/70 | HR 67 | Temp 98.0°F | Ht 66.0 in | Wt 254.0 lb

## 2019-07-06 DIAGNOSIS — G4733 Obstructive sleep apnea (adult) (pediatric): Secondary | ICD-10-CM

## 2019-07-06 DIAGNOSIS — Z9989 Dependence on other enabling machines and devices: Secondary | ICD-10-CM

## 2019-07-06 DIAGNOSIS — I2510 Atherosclerotic heart disease of native coronary artery without angina pectoris: Secondary | ICD-10-CM

## 2019-07-06 NOTE — Assessment & Plan Note (Signed)
Needs replacement machine. Has been compliant and benefits from CPAP. If we can't find original diagnostic sleep study, we may need to do an HST. Plan- replace old machine, changing to autopap 8-15

## 2019-07-06 NOTE — Progress Notes (Signed)
° °Primary Physician/Referring:  Little, James, MD ° °Patient ID: [name] [name], male    DOB: 05/29/1932, 86 y.o.   MRN: [id number] ° °Chief Complaint  °Patient presents with  °• Follow-up  °  6 week  °• Hypertension  °• Shortness of Breath  ° °HPI:   ° °[name] [name]  is a 86 y.o. Caucasian male with coronary disease with CABG in 2012, hypertension, mixed hyperlipidemia, hyperglycemia, h/o gout, OSA on CPAP, former smoker, morbid obesity who presents to the office with chief complaint of "hypertension follow-up on test results".  Recently he has been evaluated for recurrent episodes of chest tightness and also uncontrolled hypertension and dyspnea. ° °He now presents for follow-up and states that since medication changes, his blood pressure is now much more stable but has noticed leg swelling since being on hydralazine. ° °He has not had any further chest pain and dyspnea has remained stable.  Accompanied by his wife. ° °Past Medical History:  °Diagnosis Date  °• Complication of anesthesia   ° Pt difficult to intubate; 07/19/03 aborted surgery unable to intubate  °• Coronary artery disease   °• Diabetes mellitus without complication (HCC)   °• Dysthymic disorder   °• Gout   ° Hx: of in left wrist  °• Hearing deficit   ° Hx: of wears hearing aids  °• Hypertension   °• Kidney stones   ° Hx: of  °• Multiple myeloma (HCC)   °• Occlusion and stenosis of unspecified carotid artery   °• Pathologic fracture of vertebrae   °• Pneumonia   °• Rosacea   °• Sleep apnea   ° Hx: of pt wears CPAP  ° °Past Surgical History:  °Procedure Laterality Date  °• APPENDECTOMY    °• BACK SURGERY  1984,2009  ° lumb  °• CARDIAC CATHETERIZATION    ° Hx: of 04/05/10  °• CAROTID ARTERY - SUBCLAVIAN ARTERY BYPASS GRAFT  2004  ° Hx: of-left  °• CARPAL TUNNEL RELEASE Right 04/13/2013  ° Procedure: RIGHT CARPAL TUNNEL RELEASE;  Surgeon: Robert V Sypher Jr., MD;  Location: [facility];  Service: Orthopedics;  Laterality: Right;  °•  CATARACT EXTRACTION    ° Hx: of left eye only  °• COLONOSCOPY    °• COLONOSCOPY W/ BIOPSIES AND POLYPECTOMY    ° Hx: of  °• CORONARY ARTERY BYPASS GRAFT  2012  ° Hx: of 5  °• ELBOW SURGERY  1992  ° Hx: of-lt  °• HERNIA REPAIR  1963  ° Hx: of right  °• JOINT REPLACEMENT  2006  ° Hx: of left knee  °• LUMBAR LAMINECTOMY/DECOMPRESSION MICRODISCECTOMY N/A 11/18/2012  ° Procedure: REVISION L2 - L4 DECOMPRESSION DISCECTOMY 2 LEVELS;  Surgeon: Dahari Brooks, MD;  Location: [facility];  Service: Orthopedics;  Laterality: N/A;  °• ROTATOR CUFF REPAIR  2003  ° Hx: of right  °• TONSILLECTOMY    °• TRIGGER FINGER RELEASE Right 04/13/2013  ° Procedure: RIGHT THUMB A-1 PULLEY RELEASE;  Surgeon: Robert V Sypher Jr., MD;  Location: [facility];  Service: Orthopedics;  Laterality: Right;  ° °Family History  °Problem Relation Age of Onset  °• Cancer - Colon Mother   °• Heart disease Father   °• Alzheimer's disease Brother   °• Alzheimer's disease Sister   °• Alzheimer's disease Brother   °• Dementia Other   °  °Social History  ° °Tobacco Use  °• Smoking status: Former Smoker  °  Packs/day: 0.50  °    Years: 30.00    Pack years: 15.00    Types: Cigarettes    Quit date: 04/08/1978    Years since quitting: 41.2   Smokeless tobacco: Never Used   Tobacco comment: Quit smoking cigarretes in 1980  Substance Use Topics   Alcohol use: No    Alcohol/week: 0.0 standard drinks   Marital Status: Married  ROS  Review of Systems  Cardiovascular: Positive for leg swelling. Negative for dyspnea on exertion and syncope.  Respiratory: Positive for snoring.   Musculoskeletal: Positive for arthritis and joint pain. Negative for joint swelling.  Gastrointestinal: Negative for melena.   Objective  Blood pressure (!) 140/55, pulse (!) 56, resp. rate 15, height 5' 6" (1.676 m), weight 252 lb (114.3 kg), SpO2 96 %.  Vitals with BMI 07/07/2019 07/06/2019 05/25/2019  Height 5' 6" 5' 6" 5' 6"  Weight 252 lbs 254 lbs 252 lbs  BMI 40.69  60.63 01.60  Systolic 109 323 557  Diastolic 55 70 62  Pulse 56 67 60     Physical Exam  Constitutional:  Morbidly Obese  Neck:  Short neck difficult to evaluate JVP  Cardiovascular: Normal rate, regular rhythm, normal heart sounds and intact distal pulses. Exam reveals no gallop.  No murmur heard. Pulses:      Carotid pulses are 2+ on the right side and 2+ on the left side.      Dorsalis pedis pulses are 2+ on the right side and 2+ on the left side.       Posterior tibial pulses are 2+ on the right side and 2+ on the left side.  Femoral and popliteal pulse difficult to feel due to patient's body habitus.  2+ bilateral pitting leg edema.  No JVD.  Pulmonary/Chest: Effort normal and breath sounds normal. No accessory muscle usage. No respiratory distress.  Abdominal: Soft. Bowel sounds are normal.  Obese. Pannus present    Laboratory examination:   Recent Labs    04/12/19 1341 04/22/19 1140 06/08/19 1157  NA 139 142 139  K 4.4 5.1 4.8  CL 101 104 102  CO2 _0 GLUCOSE 193* 117* 107*  BUN _1 CREATININE 0.99 1.01 1.10  CALCIUM 9.9 9.4 10.2  GFRNONAA 70 68 61  GFRAA 81 79 71   CrCl cannot be calculated (Patient's most recent lab result is older than the maximum 21 days allowed.).  CMP Latest Ref Rng & Units 06/08/2019 04/22/2019 04/12/2019  Glucose 65 - 99 mg/dL 107(H) 117(H) 193(H)  BUN 8 - 27 mg/dL _2 Creatinine 0.76 - 1.27 mg/dL 1.10 1.01 0.99  Sodium 134 - 144 mmol/L 139 142 139  Potassium 3.5 - 5.2 mmol/L 4.8 5.1 4.4  Chloride 96 - 106 mmol/L 102 104 101  CO2 20 - 29 mmol/L _3 Calcium 8.6 - 10.2 mg/dL 10.2 9.4 9.9  Total Protein 6.5 - 8.1 g/dL - - -  Total Bilirubin 0.3 - 1.2 mg/dL - - -  Alkaline Phos 38 - 126 U/L - - -  AST 15 - 41 U/L - - -  ALT 0 - 44 U/L - - -   CBC Latest Ref Rng & Units 04/05/2019 10/08/2018 04/06/2018  WBC 4.0 - 10.5 K/uL 6.8 6.9 6.5  Hemoglobin 13.0 - 17.0 g/dL 14.9 14.2 14.2  Hematocrit 39.0 - 52.0 % 43.9 42.3  43.4  Platelets 150 - 400 K/uL 164 166 153   BNP (last 3 results) Recent Labs  06/08/19 °1158  °BNP 234.3*  ° ° °External labs:  °Hemoglobin 14.900 04/05/2019 °Platelets 164.000 04/05/2019 ° °Creatinine, Serum 1.010 04/22/2019 °Potassium 5.100 04/22/2019 °ALT (SGPT) 15.000 04/05/2019 ° °Medications and allergies  ° °Allergies  °Allergen Reactions  °• Ciprofloxacin   °• Hydralazine Other (See Comments)  °  Leg edema  °• Hydrocodone Nausea Only  °  Ok taking in low doses(5mg), has nausea with higher doses  °• Levofloxacin Other (See Comments)  °  Pain/cramping °  °• Oxycodone Nausea Only  °• Tramadol Nausea Only  °• Cardizem [Diltiazem Hcl] Rash  °  ° °Current Outpatient Medications  °Medication Instructions  °• acetaminophen (TYLENOL) 650 mg, Oral, Every 6 hours PRN  °• allopurinol (ZYLOPRIM) 300 mg, Oral, Daily, In the morning  °• amLODipine (NORVASC) 10 mg, Oral, Every evening  °• aspirin EC 81 mg, Daily  °• bisoprolol (ZEBETA) 10 mg,  Every morning - 10a  °• CALCIUM CITRATE-VITAMIN D PO 1 tablet, Oral, Daily, Taking [employer] brand callcium 600 mg Vit D 20mcg dose  °• cetirizine (ZYRTEC) 10 mg, Oral, Daily  °• cromolyn (OPTICROM) 4 % ophthalmic solution PLACE 1 DROP INTO BOTH EYES 2 TO 4 TIMES A DAY  °• denosumab (PROLIA) 60 MG/ML SOSY injection Every 6 months   °• fluticasone (FLONASE) 50 MCG/ACT nasal spray As needed  °• gabapentin (NEURONTIN) 100 mg, Oral, As needed, Taking it almost everyday currently  °• hydrochlorothiazide (HYDRODIURIL) 25 mg, Oral, Every morning  °• HYDROcodone-acetaminophen (NORCO/VICODIN) 5-325 MG tablet tablet  °• isosorbide dinitrate (ISORDIL) 30 mg, Oral, 3 times daily  °• levothyroxine (SYNTHROID) 100 mcg, Daily before breakfast  °• losartan (COZAAR) 100 mg, Oral, Daily, Taking at night  °• Multiple Vitamin (MULTIVITAMIN) tablet 1 tablet, Daily  °• Multiple Vitamins-Minerals (PRESERVISION AREDS PO) 2 times daily  °• nitroGLYCERIN (NITROSTAT) 0.4 mg, Sublingual, Every 5 min PRN, If your  symptoms continue after taking two tablets 5 minutes apart call 911.  °• Omega-3 Fatty Acids (FISH OIL) 1000 MG CAPS 2 capsules, Oral, 2 times daily  °• Polyethyl Glycol-Propyl Glycol (SYSTANE OP) 1 drop, 2 times daily  °• rosuvastatin (CRESTOR) 20 mg, Daily at bedtime  °• spironolactone (ALDACTONE) 25 mg, Oral, Every morning  °• testosterone enanthate (DELATESTRYL) 200 MG/ML injection Intramuscular, Every 14 days, For IM use only   °• Vitamin D (Ergocalciferol) (DRISDOL) 50,000 Units, Every 7 days  ° °Medications Discontinued During This Encounter  °Medication Reason  °• hydrALAZINE (APRESOLINE) 25 MG tablet Side effect (s)  ° ° °Radiology:  ° °No results found. ° °Cardiac Studies:  ° °Cardiac Cath: 04/05/2010: 3 Vessel CAD; RCA 99% mid; Mid Cx; 80%, Ostial LAD 80%, Mid 90% S/P CABG  x5  04/09/10  LIMA to the LAD, SVG to D1, SVG to OM1 & OM3, SVG to Rt PDA. ° °Carotid duplex 09/07/2012: No evidence of hemodynamically significant stenosis in the bilateral carotid bifurcation vessels.  There is evidence of heterogeneous plaque in the bilateral carotid artery.  Mild bilateral intimal thickening. Left carotid endarterectomy is widely patient. No significant change since 09/14/11. ° °Echo 04/07/2019: LVEF 55 to 60%, Moderate LVH, indeterminate diastolic filling pattern, moderately dilated left atrium, mild MR, mild TR, mild PR. ° °Lexiscan Tetrofosmin Stress Test  04/12/2019: °Nondiagnostic ECG stress. Resting EKG demonstrated normal sinus rhythm, high lateral infarct, old. No ST-T wave abnormalities.   °Left ventricle is normal in is mildly enlarged in both rest and stress images.  There is moderate diaphragmatic attenuation artifact in the inferior wall.    A very small sized mid inferolateral nontransmural scar cannot be completely excluded. °Dynamic gated images reveal normal wall motion, stress LVEF was 60%. °Low risk study.  ° °EKG ° °11/27/2018:Sinus rhythm with borderline first-degree AV block at the rate of 59 bpm,  normal axis, incomplete right bundle branch block.  Poor R-wave progression, probably normal variant.  No evidence of ischemia.  ° °03/30/2019: Sinus bradycardia with a ventricular rate of 59 bpm, first-degree AV block, incomplete right bundle branch block right axis deviation cannot entirely rule out lateral wall infarct, without injury pattern. ° °Assessment  ° °  ICD-10-CM   °1. HTN (hypertension), benign  I10   °2. Coronary artery disease involving native coronary artery of native heart without angina pectoris  I25.10   °3. Chronic diastolic heart failure (HCC)  I50.32   °4. OSA on CPAP  G47.33   ° Z99.89   °  ° °No orders of the defined types were placed in this encounter. °  °Medications Discontinued During This Encounter  °Medication Reason  °• hydrALAZINE (APRESOLINE) 25 MG tablet Side effect (s)  °  °Recommendations:  ° °Rector J Oftedahl  is a 86 y.o. Caucasian male with coronary disease with CABG in 2012, hypertension, mixed hyperlipidemia, hyperglycemia, h/o gout, OSA on CPAP, former smoker, morbid obesity who presents to the office with chief complaint of "hypertension follow-up on test results".  Recently he has been evaluated for recurrent episodes of chest tightness and also uncontrolled hypertension and dyspnea. ° °He now presents for follow-up and states that since medication changes, his blood pressure is now much more stable but has noticed leg swelling since being on hydralazine.  Also due to borderline high potassium, I had recommended avoidance of high potassium containing foods and fruit, now BMP reviewed and potassium is stable.  BNP is minimally elevated, suggesting chronic diastolic heart failure, no clinical evidence of acute decompensated heart failure. ° °Blood pressure is much improved, but due to the edema, will discontinue hydralazine.  Continue isosorbide dinitrate.  I will see him back in 3 months for follow-up.  He has been drinking water and excessive fashion, we discussed regarding  spacing the water intake old.  Weight loss again discussed with the patient. ° °He has been set up for a home sleep study, he is presently using CPAP and is compliant, I am sure that once he gets a new machine he probably will experience much improved blood pressure control as well. ° °With regard to coronary artery disease, he has remained stable without recurrence of angina pectoris.  He is on appropriate medical therapy. ° °Jay Ganji, MD, FACC °07/07/2019, 1:28 PM °[facility] °Office: [phone]  °

## 2019-07-06 NOTE — Telephone Encounter (Signed)
We have not seen this pt since 2011. A call was made back in 2016 asking for the same thing. We advised his wife at that time that the pt would need to be seen before we could order any CPAP supplies or new CPAP machine. An appointment was never made. LMTCB x2 for pt's wife.

## 2019-07-06 NOTE — Assessment & Plan Note (Signed)
Followed by Dr Einar Gip Cardiology

## 2019-07-06 NOTE — Progress Notes (Signed)
07/06/19- 59 yoM former smoker for sleep evaluation. Wife here. Medical problem list - OSA, CAD/CABG, Osteoporosis, MGUS, Morbid Obesity,  Hx OSA- needs to replace old CPAP machine. Original study in 1990's. Last CPAP order for 11 cwp in 2008.  Machine is giving error message, due for replacement.  CPAP does prevent snore and helps sleep better.  ENT + tosillectomy. CABG but no MI Wife has been getting his CPAP supplies on-line. They think [facility] has the original sleep study report on file. Current machine is about 84 yrs old. Dr Einar Gip- cardiology- patient likes him a lot.  Had 2 Phizer Covax.  No routine cough or wheeze. Hx multiple back surgeries for DDD.  Prior to Admission medications   Medication Sig Start Date End Date Taking? Authorizing Provider  acetaminophen (TYLENOL) 325 MG tablet Take 650 mg by mouth every 6 (six) hours as needed.   Yes [provider]  allopurinol (ZYLOPRIM) 300 MG tablet Take 300 mg by mouth daily. In the morning 04/27/11  Yes [provider]  amLODipine (NORVASC) 10 MG tablet Take 1 tablet (10 mg total) by mouth every evening. 04/14/19 07/13/19 Yes Tolia, Sunit, DO  aspirin EC 81 MG tablet Take 81 mg by mouth daily.   Yes [provider]  bisoprolol (ZEBETA) 10 MG tablet Take 10 mg by mouth every morning.  04/07/11  Yes [provider]  CALCIUM CITRATE-VITAMIN D PO Take 1 tablet by mouth daily. Taking [employer] brand callcium 600 mg Vit D 5mg dose   Yes [provider]  cromolyn (OPTICROM) 4 % ophthalmic solution PLACE 1 DROP INTO BOTH EYES 2 TO 4 TIMES A DAY 04/22/16  Yes [provider]  fluticasone (FLONASE) 50 MCG/ACT nasal spray as needed. 11/25/18  Yes [provider]  gabapentin (NEURONTIN) 100 MG capsule Take 100 mg by mouth as needed. Taking it almost everyday currently 07/30/18  Yes [provider]  hydrALAZINE (APRESOLINE) 25 MG tablet Take 1 tablet (25 mg total) by mouth 3 (three) times daily.  05/25/19 08/23/19 Yes GAdrian Prows MD  hydrochlorothiazide (HYDRODIURIL) 25 MG tablet Take 25 mg by mouth in the morning.   Yes [provider]  isosorbide dinitrate (ISORDIL) 30 MG tablet Take 1 tablet (30 mg total) by mouth 3 (three) times daily. 05/25/19  Yes GAdrian Prows MD  levothyroxine (SYNTHROID, LEVOTHROID) 100 MCG tablet Take 100 mcg by mouth daily before breakfast.   Yes [provider]  losartan (COZAAR) 100 MG tablet Take 100 mg by mouth daily. Taking at night   Yes [provider]  Multiple Vitamin (MULTIVITAMIN) tablet Take 1 tablet by mouth daily.   Yes [provider]  Multiple Vitamins-Minerals (PRESERVISION AREDS PO) in the morning and at bedtime.    Yes [provider]  Omega-3 Fatty Acids (FISH OIL) 1000 MG CAPS Take 2 capsules by mouth in the morning and at bedtime.    Yes [provider]  Polyethyl Glycol-Propyl Glycol (SYSTANE OP) Apply 1 drop to eye 2 (two) times daily. Each eye   Yes [provider]  rosuvastatin (CRESTOR) 20 MG tablet Take 20 mg by mouth at bedtime.   Yes [provider]  testosterone enanthate (DELATESTRYL) 200 MG/ML injection Inject into the muscle every 14 (fourteen) days. For IM use only   Yes [provider]  Vitamin D, Ergocalciferol, (DRISDOL) 50000 UNITS CAPS Take 50,000 Units by mouth every 7 (seven) days.  06/05/11  Yes [provider]  cetirizine (ZYRTEC) 10 MG  tablet Take 10 mg by mouth daily.    [provider]  denosumab (PROLIA) 60 MG/ML SOSY injection Every 6 months    [provider]  HYDROcodone-acetaminophen (NORCO/VICODIN) 5-325 MG tablet tablet 04/09/19   [provider]  nitroGLYCERIN (NITROSTAT) 0.4 MG SL tablet Place 1 tablet (0.4 mg total) under the tongue every 5 (five) minutes as needed for chest pain. If your symptoms continue after taking two tablets 5 minutes apart call 911. Patient not taking: Reported on 07/06/2019  03/30/19   Rex Kras, DO  spironolactone (ALDACTONE) 25 MG tablet Take 1 tablet (25 mg total) by mouth in the morning. 06/11/19   Adrian Prows, MD   Past Medical History:  Diagnosis Date  . Complication of anesthesia    Pt difficult to intubate; 07/19/03 aborted surgery unable to intubate  . Coronary artery disease   . Diabetes mellitus without complication ([place])   . Dysthymic disorder   . Gout    Hx: of in left wrist  . Hearing deficit    Hx: of wears hearing aids  . Hypertension   . Kidney stones    Hx: of  . Multiple myeloma ([place])   . Occlusion and stenosis of unspecified carotid artery   . Pathologic fracture of vertebrae   . Pneumonia   . Rosacea   . Sleep apnea    Hx: of pt wears CPAP   Past Surgical History:  Procedure Laterality Date  . APPENDECTOMY    . BACK SURGERY  [phone]   lumb  . CARDIAC CATHETERIZATION     Hx: of 04/05/10  . CAROTID ARTERY - SUBCLAVIAN ARTERY BYPASS GRAFT  2004   Hx: of-left  . CARPAL TUNNEL RELEASE Right 04/13/2013   Procedure: RIGHT CARPAL TUNNEL RELEASE;  Surgeon: Cammie Sickle., MD;  Location: [facility];  Service: Orthopedics;  Laterality: Right;  . CATARACT EXTRACTION     Hx: of left eye only  . COLONOSCOPY    . COLONOSCOPY W/ BIOPSIES AND POLYPECTOMY     Hx: of  . CORONARY ARTERY BYPASS GRAFT  2012   Hx: of 5  . ELBOW SURGERY  1992   Hx: of-lt  . HERNIA REPAIR  1963   Hx: of right  . JOINT REPLACEMENT  2006   Hx: of left knee  . LUMBAR LAMINECTOMY/DECOMPRESSION MICRODISCECTOMY N/A 11/18/2012   Procedure: REVISION L2 - L4 DECOMPRESSION DISCECTOMY 2 LEVELS;  Surgeon: Melina Schools, MD;  Location: [place] [facility];  Service: Orthopedics;  Laterality: N/A;  . ROTATOR CUFF REPAIR  2003   Hx: of right  . TONSILLECTOMY    . TRIGGER FINGER RELEASE Right 04/13/2013   Procedure: RIGHT THUMB A-1 PULLEY RELEASE;  Surgeon: Cammie Sickle., MD;  Location: [place];  Service: Orthopedics;  Laterality: Right;    Family History  Problem Relation Age of Onset  . Cancer - Colon Mother   . Heart disease Father   . Alzheimer's disease Brother   . Alzheimer's disease Sister   . Alzheimer's disease Brother   . Dementia Other    Social History   Socioeconomic History  . Marital status: Married    Spouse name: Not on file  . Number of children: 1  . Years of education: college  . Highest education level: Not on file  Occupational History  . Occupation: Retired   Tobacco Use  . Smoking status: Former Smoker    Packs/day: 0.50    Years: 30.00  Pack years: 15.00    Types: Cigarettes    Quit date: 04/08/1978    Years since quitting: 41.2  . Smokeless tobacco: Never Used  . Tobacco comment: Quit smoking cigarretes in 1980  Substance and Sexual Activity  . Alcohol use: No    Alcohol/week: 0.0 standard drinks  . Drug use: No  . Sexual activity: Not on file  Other Topics Concern  . Not on file  Social History Narrative   Rare caffeine use    Social Determinants of Health   Financial Resource Strain:   . Difficulty of Paying Living Expenses:   Food Insecurity:   . Worried About Charity fundraiser in the Last Year:   . Arboriculturist in the Last Year:   Transportation Needs:   . Film/video editor (Medical):   [name] Lack of Transportation (Non-Medical):   Physical Activity:   . Days of Exercise per Week:   . Minutes of Exercise per Session:   Stress:   . Feeling of Stress :   Social Connections:   . Frequency of Communication with Friends and Family:   . Frequency of Social Gatherings with Friends and Family:   . Attends Religious Services:   . Active Member of Clubs or Organizations:   . Attends Archivist Meetings:   [name] Marital Status:   Intimate Partner Violence:   . Fear of Current or Ex-Partner:   . Emotionally Abused:   [name] Physically Abused:   . Sexually Abused:    ROS-see HPI   + = positive Constitutional:    weight loss, night sweats, fevers, chills,  fatigue, lassitude. HEENT:    headaches, difficulty swallowing, tooth/dental problems, sore throat,      + sneezing, itching, ear ache, +nasal congestion, post nasal drip, snoring CV:    chest pain, orthopnea, PND, +swelling in lower extremities, anasarca,                                  dizziness, palpitations Resp:   +shortness of breath with exertion or at rest.                productive cough,   +non-productive cough, coughing up of blood.              change in color of mucus.  wheezing.   Skin:    rash or lesions. GI:  No-   heartburn, indigestion, abdominal pain, nausea, vomiting, diarrhea,                 change in bowel habits, loss of appetite GU: dysuria, change in color of urine, no urgency or frequency.   flank pain. MS:   +joint pain, stiffness, decreased range of motion, back pain. Neuro-     nothing unusual Psych:  change in mood or affect.  depression or anxiety.   memory loss.  OBJ- Physical Exam General- Alert, Oriented, Affect-appropriate, Distress- none acute, + obese Skin- rash-none, lesions- none, excoriation- none Lymphadenopathy- none Head- atraumatic            Eyes- Gross vision intact, PERRLA, conjunctivae and secretions clear            Ears- Hearing, canals-normal            Nose- Clear, no-Septal dev, mucus, polyps, erosion, perforation             Throat- Mallampati II , mucosa  clear , drainage- none, tonsils- atrophic Neck- + thick, flexible , trachea midline, no stridor , thyroid nl, carotid no bruit Chest - symmetrical excursion , unlabored           Heart/CV- RRR , no murmur , no gallop  , no rub, nl s1 s2                           - JVD- none , edema- none, stasis changes- none, varices- none           Lung- + trace crackle L base-transient, wheeze- none, cough- none , dullness-none, rub- none           Chest wall-  Abd-  Br/ Gen/ Rectal- Not done, not indicated Extrem- cyanosis- none, clubbing, none, atrophy- none, strength- nl, + elastic  hose Neuro- grossly intact to observation

## 2019-07-06 NOTE — Telephone Encounter (Signed)
Called pt and spoke with this wife letting her know the info stated by CY and she verbalized understanding. Order has been placed for the HST and made comment for this to be done off of pt's CPAP. Nothing further needed.

## 2019-07-06 NOTE — Telephone Encounter (Signed)
Called [facility] to see if they had a sleep study on file for pt. Pt was seen today by CY for a consult and CY stated that pt's CPAP machine is old and he is needing a new one. There is no sleep study on file in pt's chart in Epic.  Spoke with Wells Guiles at [facility] to see if they had a copy of pt's sleep study on file and she stated they did not.  Dr. Annamaria Boots, please advise.

## 2019-07-06 NOTE — Telephone Encounter (Signed)
Since [name] [name] does not have original diagnostic sleep study, we will need to get an home sleep test to document for insurance.  Please order HST  for dx OSA   This needs to be done off CPAP.  Please call us for results about 2 weeks after the study.   I think he has a return office visit within 31-90 days per insurance regs.

## 2019-07-06 NOTE — Assessment & Plan Note (Signed)
Encourage effort to lose some weight. Discussed impact on OSA.

## 2019-07-06 NOTE — Patient Instructions (Signed)
Order- DME [facility]   Please replace old CPAP machine, change to auto 8-15, mask of choice, humidifier, supplies, AirView/ card   Please call if we can help

## 2019-07-07 ENCOUNTER — Ambulatory Visit: Payer: Medicare PPO | Admitting: Cardiology

## 2019-07-07 ENCOUNTER — Other Ambulatory Visit: Payer: Self-pay

## 2019-07-07 ENCOUNTER — Encounter: Payer: Self-pay | Admitting: Cardiology

## 2019-07-07 VITALS — BP 140/55 | HR 56 | Resp 15 | Ht 66.0 in | Wt 252.0 lb

## 2019-07-07 DIAGNOSIS — G4733 Obstructive sleep apnea (adult) (pediatric): Secondary | ICD-10-CM

## 2019-07-07 DIAGNOSIS — I10 Essential (primary) hypertension: Secondary | ICD-10-CM

## 2019-07-07 DIAGNOSIS — I5032 Chronic diastolic (congestive) heart failure: Secondary | ICD-10-CM

## 2019-07-07 DIAGNOSIS — I2510 Atherosclerotic heart disease of native coronary artery without angina pectoris: Secondary | ICD-10-CM

## 2019-07-07 NOTE — Telephone Encounter (Signed)
LMTCB x3 for pt's wife, Onalee Hua. We have attempted to contact pt's wife several times with no success or call back from her. Per triage protocol, message will be closed.

## 2019-08-04 ENCOUNTER — Telehealth: Payer: Self-pay | Admitting: Internal Medicine

## 2019-08-05 NOTE — Telephone Encounter (Signed)
[name] [name] has this order

## 2019-08-05 NOTE — Telephone Encounter (Signed)
I have spoke with the wife Onalee Hua and [name] [name] has been scheduled to pick up HST machine on 08/09/19 @ 2:00pm

## 2019-08-09 ENCOUNTER — Other Ambulatory Visit: Payer: Self-pay

## 2019-08-09 ENCOUNTER — Ambulatory Visit: Payer: Medicare PPO

## 2019-08-09 DIAGNOSIS — G4733 Obstructive sleep apnea (adult) (pediatric): Secondary | ICD-10-CM

## 2019-08-13 ENCOUNTER — Telehealth: Payer: Self-pay | Admitting: Internal Medicine

## 2019-08-13 DIAGNOSIS — G4733 Obstructive sleep apnea (adult) (pediatric): Secondary | ICD-10-CM

## 2019-08-13 NOTE — Addendum Note (Signed)
Addended by: Lorretta Harp on: 08/13/2019 04:35 PM   Modules accepted: Orders

## 2019-08-13 NOTE — Telephone Encounter (Signed)
Order has been placed for new CPAP machine. Attempted to call pt to let him know the results of HST and about the order that was placed but unable to reach and unable to leave  VM. Will try to call back later.  Will keep encounter in triage as encounter was closed.

## 2019-08-13 NOTE — Telephone Encounter (Signed)
Home sleep test  Showed severe bstructive sleep apnea, averaging 33.9 apneas/ hor, with drops in blood oxygen level.   Please order- [facility]- replace old CPAP machine, auto 8-15, mask of choice, humidifier, supplies, Airview/ card

## 2019-08-13 NOTE — Telephone Encounter (Signed)
Dr. Annamaria Boots please advise on results of pt's HST. Thanks!

## 2019-08-17 ENCOUNTER — Other Ambulatory Visit: Payer: Self-pay | Admitting: Cardiology

## 2019-08-17 DIAGNOSIS — I10 Essential (primary) hypertension: Secondary | ICD-10-CM

## 2019-08-17 DIAGNOSIS — I2510 Atherosclerotic heart disease of native coronary artery without angina pectoris: Secondary | ICD-10-CM

## 2019-08-20 ENCOUNTER — Other Ambulatory Visit: Payer: Self-pay | Admitting: Cardiology

## 2019-09-28 ENCOUNTER — Other Ambulatory Visit: Payer: Self-pay | Admitting: *Deleted

## 2019-09-28 DIAGNOSIS — D472 Monoclonal gammopathy: Secondary | ICD-10-CM

## 2019-09-29 ENCOUNTER — Inpatient Hospital Stay: Payer: Medicare PPO | Attending: Oncology

## 2019-09-29 ENCOUNTER — Other Ambulatory Visit: Payer: Self-pay | Admitting: Oncology

## 2019-09-29 ENCOUNTER — Other Ambulatory Visit: Payer: Self-pay

## 2019-09-29 ENCOUNTER — Inpatient Hospital Stay: Payer: Medicare PPO

## 2019-09-29 VITALS — BP 140/68 | HR 59 | Resp 19

## 2019-09-29 DIAGNOSIS — D472 Monoclonal gammopathy: Secondary | ICD-10-CM | POA: Insufficient documentation

## 2019-09-29 DIAGNOSIS — M818 Other osteoporosis without current pathological fracture: Secondary | ICD-10-CM

## 2019-09-29 DIAGNOSIS — Z79899 Other long term (current) drug therapy: Secondary | ICD-10-CM | POA: Diagnosis not present

## 2019-09-29 LAB — CBC WITH DIFFERENTIAL (CANCER CENTER ONLY)
Abs Immature Granulocytes: 0.01 10*3/uL (ref 0.00–0.07)
Basophils Absolute: 0 10*3/uL (ref 0.0–0.1)
Basophils Relative: 0 %
Eosinophils Absolute: 0.1 10*3/uL (ref 0.0–0.5)
Eosinophils Relative: 2 %
HCT: 37.6 % — ABNORMAL LOW (ref 39.0–52.0)
Hemoglobin: 13 g/dL (ref 13.0–17.0)
Immature Granulocytes: 0 %
Lymphocytes Relative: 32 %
Lymphs Abs: 2.2 10*3/uL (ref 0.7–4.0)
MCH: 33 pg (ref 26.0–34.0)
MCHC: 34.6 g/dL (ref 30.0–36.0)
MCV: 95.4 fL (ref 80.0–100.0)
Monocytes Absolute: 0.6 10*3/uL (ref 0.1–1.0)
Monocytes Relative: 9 %
Neutro Abs: 4 10*3/uL (ref 1.7–7.7)
Neutrophils Relative %: 57 %
Platelet Count: 149 10*3/uL — ABNORMAL LOW (ref 150–400)
RBC: 3.94 MIL/uL — ABNORMAL LOW (ref 4.22–5.81)
RDW: 14.1 % (ref 11.5–15.5)
WBC Count: 7 10*3/uL (ref 4.0–10.5)
nRBC: 0 % (ref 0.0–0.2)

## 2019-09-29 LAB — CMP (CANCER CENTER ONLY)
ALT: 12 U/L (ref 0–44)
AST: 20 U/L (ref 15–41)
Albumin: 3.8 g/dL (ref 3.5–5.0)
Alkaline Phosphatase: 57 U/L (ref 38–126)
Anion gap: 8 (ref 5–15)
BUN: 27 mg/dL — ABNORMAL HIGH (ref 8–23)
CO2: 22 mmol/L (ref 22–32)
Calcium: 10.3 mg/dL (ref 8.9–10.3)
Chloride: 107 mmol/L (ref 98–111)
Creatinine: 1.12 mg/dL (ref 0.61–1.24)
GFR, Est AFR Am: >60 mL/min (ref >60)
GFR, Estimated: 60 mL/min — ABNORMAL LOW (ref >60)
Glucose, Bld: 119 mg/dL — ABNORMAL HIGH (ref 70–99)
Potassium: 4.8 mmol/L (ref 3.5–5.1)
Sodium: 137 mmol/L (ref 135–145)
Total Bilirubin: 0.8 mg/dL (ref 0.3–1.2)
Total Protein: 7.4 g/dL (ref 6.5–8.1)

## 2019-09-29 MED ORDER — DENOSUMAB 60 MG/ML ~~LOC~~ SOSY
PREFILLED_SYRINGE | SUBCUTANEOUS | Status: AC
  Filled 2019-09-29: qty 1

## 2019-09-29 MED ORDER — DENOSUMAB 60 MG/ML ~~LOC~~ SOSY
60.0000 mg | PREFILLED_SYRINGE | Freq: Once | SUBCUTANEOUS | Status: AC
  Administered 2019-09-29: 60 mg via SUBCUTANEOUS

## 2019-09-29 NOTE — Patient Instructions (Signed)
Denosumab injection What is this medicine? DENOSUMAB (den oh sue mab) slows bone breakdown. Prolia is used to treat osteoporosis in women after menopause and in men, and in people who are taking corticosteroids for 6 months or more. Xgeva is used to treat a high calcium level due to cancer and to prevent bone fractures and other bone problems caused by multiple myeloma or cancer bone metastases. Xgeva is also used to treat giant cell tumor of the bone. This medicine may be used for other purposes; ask your health care provider or pharmacist if you have questions. COMMON BRAND NAME(S): Prolia, XGEVA What should I tell my health care provider before I take this medicine? They need to know if you have any of these conditions:  dental disease  having surgery or tooth extraction  infection  kidney disease  low levels of calcium or Vitamin D in the blood  malnutrition  on hemodialysis  skin conditions or sensitivity  thyroid or parathyroid disease  an unusual reaction to denosumab, other medicines, foods, dyes, or preservatives  pregnant or trying to get pregnant  breast-feeding How should I use this medicine? This medicine is for injection under the skin. It is given by a health care professional in a hospital or clinic setting. A special MedGuide will be given to you before each treatment. Be sure to read this information carefully each time. For Prolia, talk to your pediatrician regarding the use of this medicine in children. Special care may be needed. For Xgeva, talk to your pediatrician regarding the use of this medicine in children. While this drug may be prescribed for children as young as 13 years for selected conditions, precautions do apply. Overdosage: If you think you have taken too much of this medicine contact a poison control center or emergency room at once. NOTE: This medicine is only for you. Do not share this medicine with others. What if I miss a dose? It is  important not to miss your dose. Call your doctor or health care professional if you are unable to keep an appointment. What may interact with this medicine? Do not take this medicine with any of the following medications:  other medicines containing denosumab This medicine may also interact with the following medications:  medicines that lower your chance of fighting infection  steroid medicines like prednisone or cortisone This list may not describe all possible interactions. Give your health care provider a list of all the medicines, herbs, non-prescription drugs, or dietary supplements you use. Also tell them if you smoke, drink alcohol, or use illegal drugs. Some items may interact with your medicine. What should I watch for while using this medicine? Visit your doctor or health care professional for regular checks on your progress. Your doctor or health care professional may order blood tests and other tests to see how you are doing. Call your doctor or health care professional for advice if you get a fever, chills or sore throat, or other symptoms of a cold or flu. Do not treat yourself. This drug may decrease your body's ability to fight infection. Try to avoid being around people who are sick. You should make sure you get enough calcium and vitamin D while you are taking this medicine, unless your doctor tells you not to. Discuss the foods you eat and the vitamins you take with your health care professional. See your dentist regularly. Brush and floss your teeth as directed. Before you have any dental work done, tell your dentist you are   receiving this medicine. Do not become pregnant while taking this medicine or for 5 months after stopping it. Talk with your doctor or health care professional about your birth control options while taking this medicine. Women should inform their doctor if they wish to become pregnant or think they might be pregnant. There is a potential for serious side  effects to an unborn child. Talk to your health care professional or pharmacist for more information. What side effects may I notice from receiving this medicine? Side effects that you should report to your doctor or health care professional as soon as possible:  allergic reactions like skin rash, itching or hives, swelling of the face, lips, or tongue  bone pain  breathing problems  dizziness  jaw pain, especially after dental work  redness, blistering, peeling of the skin  signs and symptoms of infection like fever or chills; cough; sore throat; pain or trouble passing urine  signs of low calcium like fast heartbeat, muscle cramps or muscle pain; pain, tingling, numbness in the hands or feet; seizures  unusual bleeding or bruising  unusually weak or tired Side effects that usually do not require medical attention (report to your doctor or health care professional if they continue or are bothersome):  constipation  diarrhea  headache  joint pain  loss of appetite  muscle pain  runny nose  tiredness  upset stomach This list may not describe all possible side effects. Call your doctor for medical advice about side effects. You may report side effects to FDA at 1-800-FDA-1088. Where should I keep my medicine? This medicine is only given in a clinic, doctor's office, or other health care setting and will not be stored at home. NOTE: This sheet is a summary. It may not cover all possible information. If you have questions about this medicine, talk to your doctor, pharmacist, or health care provider.  2020 Elsevier/Gold Standard (2017-05-23 16:10:44)

## 2019-09-30 LAB — KAPPA/LAMBDA LIGHT CHAINS
Kappa free light chain: 31.2 mg/L — ABNORMAL HIGH (ref 3.3–19.4)
Kappa, lambda light chain ratio: 3.59 — ABNORMAL HIGH (ref 0.26–1.65)
Lambda free light chains: 8.7 mg/L (ref 5.7–26.3)

## 2019-09-30 LAB — PROTEIN ELECTROPHORESIS, SERUM
A/G Ratio: 1.3 (ref 0.7–1.7)
Albumin ELP: 3.8 g/dL (ref 2.9–4.4)
Alpha-1-Globulin: 0.2 g/dL (ref 0.0–0.4)
Alpha-2-Globulin: 0.8 g/dL (ref 0.4–1.0)
Beta Globulin: 0.8 g/dL (ref 0.7–1.3)
Gamma Globulin: 1.2 g/dL (ref 0.4–1.8)
Globulin, Total: 3 g/dL (ref 2.2–3.9)
M-Spike, %: 0.8 g/dL — ABNORMAL HIGH
Total Protein ELP: 6.8 g/dL (ref 6.0–8.5)

## 2019-10-07 ENCOUNTER — Ambulatory Visit: Payer: Medicare PPO | Admitting: Internal Medicine

## 2019-10-07 ENCOUNTER — Ambulatory Visit: Payer: Medicare PPO | Admitting: Cardiology

## 2019-10-08 ENCOUNTER — Encounter: Payer: Self-pay | Admitting: Cardiology

## 2019-10-08 ENCOUNTER — Other Ambulatory Visit: Payer: Self-pay

## 2019-10-08 ENCOUNTER — Ambulatory Visit: Payer: Medicare PPO | Admitting: Cardiology

## 2019-10-08 VITALS — BP 149/64 | HR 60 | Resp 16 | Ht 66.0 in | Wt 249.2 lb

## 2019-10-08 DIAGNOSIS — I10 Essential (primary) hypertension: Secondary | ICD-10-CM

## 2019-10-08 MED ORDER — LABETALOL HCL 200 MG PO TABS: 200.0000 mg | ORAL_TABLET | Freq: Two times a day (BID) | ORAL | 3 refills | Status: DC

## 2019-10-08 NOTE — Progress Notes (Signed)
Primary Physician/Referring:  Tamsen Roers, MD  Patient ID: [name] [name], male    DOB: 02/07/1932, 86 y.o.   MRN: [id number]  Chief Complaint  Patient presents with   Hypertension   Coronary Artery Disease   Follow-up    3 month   HPI:    [name] [name]  is a 86 y.o. Caucasian male with coronary disease with CABG in 2012, hypertension, mixed hyperlipidemia, hyperglycemia, h/o gout, OSA on CPAP, former smoker, morbid obesity who presents to the office with chief complaint of "hypertension follow-up on test results".  Recently he has been evaluated for recurrent episodes of chest tightness and also uncontrolled hypertension and dyspnea.  On his last office visit I had added amlodipine which he is tolerating however has noticed marked increase in leg edema that is chronic.  Otherwise he is doing well, states that his blood pressure recordings at home has been under excellent control, his wife checks his blood pressure at least once or twice a day.  He has not had any further angina pectoris and no PND or orthopnea or chronic dyspnea has remained stable.  Past Medical History:  Diagnosis Date   Complication of anesthesia    Pt difficult to intubate; 07/19/03 aborted surgery unable to intubate   Coronary artery disease    Diabetes mellitus without complication ([place])    Dysthymic disorder    Gout    Hx: of in left wrist   Hearing deficit    Hx: of wears hearing aids   Hypertension    Kidney stones    Hx: of   Multiple myeloma ([place])    Occlusion and stenosis of unspecified carotid artery    Pathologic fracture of vertebrae    Pneumonia    Rosacea    Sleep apnea    Hx: of pt wears CPAP   Past Surgical History:  Procedure Laterality Date   APPENDECTOMY     BACK SURGERY  [phone]   lumb   CARDIAC CATHETERIZATION     Hx: of 04/05/10   CAROTID ARTERY - SUBCLAVIAN ARTERY BYPASS GRAFT  2004   Hx: of-left   CARPAL TUNNEL RELEASE Right 04/13/2013   Procedure:  RIGHT CARPAL TUNNEL RELEASE;  Surgeon: Cammie Sickle., MD;  Location: [facility];  Service: Orthopedics;  Laterality: Right;   CATARACT EXTRACTION     Hx: of left eye only   COLONOSCOPY     COLONOSCOPY W/ BIOPSIES AND POLYPECTOMY     Hx: of   CORONARY ARTERY BYPASS GRAFT  2012   Hx: of [place]   Hx: of-lt   HERNIA REPAIR  1963   Hx: of right   JOINT REPLACEMENT  2006   Hx: of left knee   LUMBAR LAMINECTOMY/DECOMPRESSION MICRODISCECTOMY N/A 11/18/2012   Procedure: REVISION L2 - L4 DECOMPRESSION DISCECTOMY 2 LEVELS;  Surgeon: Melina Schools, MD;  Location: [facility];  Service: Orthopedics;  Laterality: N/A;   ROTATOR CUFF REPAIR  2003   Hx: of right   TONSILLECTOMY     TRIGGER FINGER RELEASE Right 04/13/2013   Procedure: RIGHT THUMB A-1 PULLEY RELEASE;  Surgeon: Cammie Sickle., MD;  Location: [facility];  Service: Orthopedics;  Laterality: Right;   Family History  Problem Relation Age of Onset   Cancer - Colon Mother    Heart disease Father    Alzheimer's disease Brother    Alzheimer's disease Sister    Alzheimer's disease Brother  Dementia Other     Social History   Tobacco Use   Smoking status: Former Smoker    Packs/day: 0.50    Years: 30.00    Pack years: 15.00    Types: Cigarettes    Quit date: 04/08/1978    Years since quitting: 41.5   Smokeless tobacco: Never Used   Tobacco comment: Quit smoking cigarretes in 1980  Substance Use Topics   Alcohol use: No    Alcohol/week: 0.0 standard drinks   Marital Status: Married  ROS  Review of Systems  Cardiovascular: Positive for leg swelling. Negative for dyspnea on exertion and syncope.  Respiratory: Positive for snoring.   Musculoskeletal: Positive for arthritis and joint pain. Negative for joint swelling.  Gastrointestinal: Negative for melena.   Objective  Blood pressure (!) 149/64, pulse 60, resp. rate 16, height _0  (1.676 m), weight 249  lb 3.2 oz (113 kg).  Vitals with BMI 10/08/2019 09/29/2019 07/07/2019  Height _1  - _2   Weight 249 lbs 3 oz - 252 lbs  BMI 01.02 - 72.53  Systolic 664 403 474  Diastolic 64 68 55  Pulse 60 59 56     Physical Exam Constitutional:      Comments: Morbidly Obese  Neck:     Comments: Short neck difficult to evaluate JVP Cardiovascular:     Rate and Rhythm: Normal rate and regular rhythm.     Pulses: Intact distal pulses.          Carotid pulses are 2+ on the right side and 2+ on the left side.      Dorsalis pedis pulses are 2+ on the right side and 2+ on the left side.       Posterior tibial pulses are 2+ on the right side and 2+ on the left side.     Heart sounds: Normal heart sounds. No murmur heard.  No gallop.      Comments: Femoral and popliteal pulse difficult to feel due to patient's body habitus.  2+ bilateral pitting leg edema.  No JVD. Pulmonary:     Effort: Pulmonary effort is normal. No accessory muscle usage or respiratory distress.     Breath sounds: Normal breath sounds.  Abdominal:     General: Bowel sounds are normal.     Palpations: Abdomen is soft.     Comments: Obese. Pannus present     Laboratory examination:   Recent Labs    04/22/19 1140 06/08/19 1157 09/29/19 1059  NA 142 139 137  K 5.1 4.8 4.8  CL 104 102 107  CO2 _3 GLUCOSE 117* 107* 119*  BUN 24 23 27*  CREATININE 1.01 1.10 1.12  CALCIUM 9.4 10.2 10.3  GFRNONAA 68 61 60*  GFRAA 79 71 >60   estimated creatinine clearance is 58 mL/min (by C-G formula based on SCr of 1.12 mg/dL).  CMP Latest Ref Rng & Units 09/29/2019 06/08/2019 04/22/2019  Glucose 70 - 99 mg/dL 119(H) 107(H) 117(H)  BUN 8 - 23 mg/dL 27(H) 23 24  Creatinine 0.61 - 1.24 mg/dL 1.12 1.10 1.01  Sodium 135 - 145 mmol/L 137 139 142  Potassium 3.5 - 5.1 mmol/L 4.8 4.8 5.1  Chloride 98 - 111 mmol/L 107 102 104  CO2 22 - 32 mmol/L _4 Calcium 8.9 - 10.3 mg/dL 10.3 10.2 9.4  Total Protein 6.5 - 8.1 g/dL 7.4 - -  Total  Bilirubin 0.3 - 1.2 mg/dL 0.8 - -  Alkaline Phos 38 -  126 U/L 57 - -  AST 15 - 41 U/L 20 - -  ALT 0 - 44 U/L 12 - -   CBC Latest Ref Rng & Units 09/29/2019 04/05/2019 10/08/2018  WBC 4.0 - 10.5 K/uL 7.0 6.8 6.9  Hemoglobin 13.0 - 17.0 g/dL 13.0 14.9 14.2  Hematocrit 39 - 52 % 37.6(L) 43.9 42.3  Platelets 150 - 400 K/uL 149(L) 164 166   BNP (last 3 results) Recent Labs    06/08/19 1158  BNP 234.3*    External labs:  Hemoglobin 14.900 04/05/2019 Platelets 164.000 04/05/2019  Creatinine, Serum 1.010 04/22/2019 Potassium 5.100 04/22/2019 ALT (SGPT) 15.000 04/05/2019  Medications and allergies   Allergies  Allergen Reactions   Amlodipine Swelling   Ciprofloxacin    Hydralazine Other (See Comments)    Leg edema   Hydrocodone Nausea Only    Ok taking in low doses(89m), has nausea with higher doses   Levofloxacin Other (See Comments)    Pain/cramping    Oxycodone Nausea Only   Tramadol Nausea Only   Cardizem [Diltiazem Hcl] Rash    Outpatient Medications Prior to Visit  Medication Sig Dispense Refill   acetaminophen (TYLENOL) 325 MG tablet Take 650 mg by mouth every 6 (six) hours as needed.     allopurinol (ZYLOPRIM) 300 MG tablet Take 300 mg by mouth daily. In the morning     aspirin EC 81 MG tablet Take 81 mg by mouth daily.     Calcium 600-200 MG-UNIT tablet Take 1 tablet by mouth daily.     cetirizine (ZYRTEC) 10 MG tablet Take 10 mg by mouth daily.     cromolyn (OPTICROM) 4 % ophthalmic solution PLACE 1 DROP INTO BOTH EYES 2 TO 4 TIMES A DAY  2   denosumab (PROLIA) 60 MG/ML SOSY injection Every 6 months     docusate sodium (STOOL SOFTENER) 100 MG capsule Take 100 mg by mouth 2 (two) times daily.     fluticasone (FLONASE) 50 MCG/ACT nasal spray as needed.     gabapentin (NEURONTIN) 100 MG capsule Take 100 mg by mouth as needed. Taking it almost everyday currently     hydrochlorothiazide (HYDRODIURIL) 25 MG tablet TAKE 1 TABLET BY MOUTH EVERY DAY 90 tablet 1    isosorbide dinitrate (ISORDIL) 30 MG tablet TAKE 1 TABLET BY MOUTH THREE TIMES A DAY 90 tablet 2   levothyroxine (SYNTHROID, LEVOTHROID) 100 MCG tablet Take 100 mcg by mouth daily before breakfast.     losartan (COZAAR) 100 MG tablet Take 100 mg by mouth daily. Taking at night     Multiple Vitamin (MULTIVITAMIN) tablet Take 1 tablet by mouth daily.     Multiple Vitamins-Minerals (PRESERVISION AREDS PO) in the morning and at bedtime.      nitroGLYCERIN (NITROSTAT) 0.4 MG SL tablet Place 1 tablet (0.4 mg total) under the tongue every 5 (five) minutes as needed for chest pain. If your symptoms continue after taking two tablets 5 minutes apart call 911. 90 tablet 3   Omega-3 Fatty Acids (FISH OIL) 1000 MG CAPS Take 2 capsules by mouth in the morning and at bedtime.      Polyethyl Glycol-Propyl Glycol (SYSTANE OP) Apply 1 drop to eye 2 (two) times daily. Each eye     rosuvastatin (CRESTOR) 20 MG tablet Take 20 mg by mouth at bedtime.     spironolactone (ALDACTONE) 25 MG tablet Take 1 tablet (25 mg total) by mouth in the morning. 90 tablet 2   testosterone enanthate (DELATESTRYL) 200  MG/ML injection Inject into the muscle every 14 (fourteen) days. For IM use only     Vitamin D, Ergocalciferol, (DRISDOL) 50000 UNITS CAPS Take 50,000 Units by mouth every 7 (seven) days.      amLODipine (NORVASC) 10 MG tablet Take 1 tablet (10 mg total) by mouth every evening. 180 tablet 1   bisoprolol (ZEBETA) 10 MG tablet Take 10 mg by mouth every morning.      amoxicillin (AMOXIL) 500 MG capsule Take 4 capsules by mouth as directed. Take 4 capsules 1 hour prior to dental procedure     CALCIUM CITRATE-VITAMIN D PO Take 1 tablet by mouth daily. Taking [employer] brand callcium 600 mg Vit D 20mg dose     HYDROcodone-acetaminophen (NORCO/VICODIN) 5-325 MG tablet tablet     No facility-administered medications prior to visit.    Radiology:   No results found.  Cardiac Studies:   Cardiac Cath:  04/05/2010: 3 Vessel CAD; RCA 99% mid; Mid Cx; 80%, Ostial LAD 80%, Mid 90% S/P CABG  x5  04/09/10  LIMA to the LAD, SVG to D1, SVG to OM1 & OM3, SVG to Rt PDA.  Carotid duplex 09/07/2012: No evidence of hemodynamically significant stenosis in the bilateral carotid bifurcation vessels.  There is evidence of heterogeneous plaque in the bilateral carotid artery.  Mild bilateral intimal thickening. Left carotid endarterectomy is widely patient. No significant change since 09/14/11.  Echo 04/07/2019: LVEF 55 to 60%, Moderate LVH, indeterminate diastolic filling pattern, moderately dilated left atrium, mild MR, mild TR, mild PR.  Lexiscan Tetrofosmin Stress Test  04/12/2019: Nondiagnostic ECG stress. Resting EKG demonstrated normal sinus rhythm, high lateral infarct, old. No ST-T wave abnormalities.   Left ventricle is normal in is mildly enlarged in both rest and stress images.  There is moderate diaphragmatic attenuation artifact in the inferior wall.  A very small sized mid inferolateral nontransmural scar cannot be completely excluded. Dynamic gated images reveal normal wall motion, stress LVEF was 60%. Low risk study.   EKG  11/27/2018:Sinus rhythm with borderline first-degree AV block at the rate of 59 bpm, normal axis, incomplete right bundle branch block.  Poor R-wave progression, probably normal variant.  No evidence of ischemia.   03/30/2019: Sinus bradycardia with a ventricular rate of 59 bpm, first-degree AV block, incomplete right bundle branch block right axis deviation cannot entirely rule out lateral wall infarct, without injury pattern.  Assessment     ICD-10-CM   1. HTN (hypertension), benign  I10 labetalol (NORMODYNE) 200 MG tablet     Meds ordered this encounter  Medications   labetalol (NORMODYNE) 200 MG tablet    Sig: Take 1 tablet (200 mg total) by mouth 2 (two) times daily.    Dispense:  60 tablet    Refill:  3    Discontinue Amlodipine due to edema. Switching Bisoprolol  to Labetalol    Medications Discontinued During This Encounter  Medication Reason   CALCIUM CITRATE-VITAMIN D PO Patient Preference   HYDROcodone-acetaminophen (NORCO/VICODIN) 5-325 MG tablet No longer needed (for PRN medications)   amLODipine (NORVASC) 10 MG tablet Side effect (s)   bisoprolol (ZEBETA) 10 MG tablet Change in therapy    Recommendations:   [name] [name] is a 86y.o. Caucasian male with coronary disease with CABG in 2012, hypertension, mixed hyperlipidemia, hyperglycemia, h/o gout, OSA on CPAP, former smoker, morbid obesity presents for follow-up and evaluation of difficult to control hypertension, on his last office visit I had added amlodipine.  With this the  blood pressure has been under excellent control and his wife has been monitoring this at home.  However he has noticed worsening leg edema.  I will discontinue amlodipine and also Zebeta and switch him to labetalol 200 mg p.o. twice daily.  Advised the patient and his wife who monitors the blood pressure closely regarding watching for low heart rate and to notify me if heart rate is consistently <50 bpm.  She will also continue to monitor the blood pressure, goal blood pressure 130/80 mmHg.  With regard to coronary artery disease, he has remained stable without recurrence of angina pectoris.  He is on appropriate medical therapy.  I will see him back in 6 months or sooner if problems.  They state that they will continue to follow with his PCP regarding hypertension management very closely as well.   Adrian Prows, MD, [facility] 10/08/2019, [place] [facility] Office: [phone]

## 2019-10-31 ENCOUNTER — Other Ambulatory Visit: Payer: Self-pay | Admitting: Cardiology

## 2019-11-20 ENCOUNTER — Other Ambulatory Visit: Payer: Self-pay | Admitting: Cardiology

## 2019-11-20 DIAGNOSIS — I2510 Atherosclerotic heart disease of native coronary artery without angina pectoris: Secondary | ICD-10-CM

## 2019-11-20 DIAGNOSIS — I10 Essential (primary) hypertension: Secondary | ICD-10-CM

## 2019-11-29 ENCOUNTER — Ambulatory Visit: Payer: Medicare Other | Admitting: Cardiology

## 2020-01-25 ENCOUNTER — Other Ambulatory Visit: Payer: Self-pay | Admitting: Cardiology

## 2020-01-25 DIAGNOSIS — I10 Essential (primary) hypertension: Secondary | ICD-10-CM

## 2020-01-26 ENCOUNTER — Other Ambulatory Visit: Payer: Self-pay | Admitting: Cardiology

## 2020-01-26 DIAGNOSIS — I10 Essential (primary) hypertension: Secondary | ICD-10-CM

## 2020-03-07 ENCOUNTER — Other Ambulatory Visit: Payer: Self-pay | Admitting: Cardiology

## 2020-03-07 DIAGNOSIS — I2510 Atherosclerotic heart disease of native coronary artery without angina pectoris: Secondary | ICD-10-CM

## 2020-03-07 DIAGNOSIS — I10 Essential (primary) hypertension: Secondary | ICD-10-CM

## 2020-04-05 ENCOUNTER — Inpatient Hospital Stay: Payer: Medicare PPO

## 2020-04-05 ENCOUNTER — Inpatient Hospital Stay: Payer: Medicare PPO | Attending: Oncology

## 2020-04-05 ENCOUNTER — Other Ambulatory Visit: Payer: Self-pay

## 2020-04-05 ENCOUNTER — Encounter: Payer: Self-pay | Admitting: Oncology

## 2020-04-05 ENCOUNTER — Inpatient Hospital Stay: Payer: Medicare PPO | Admitting: Oncology

## 2020-04-05 VITALS — BP 137/49 | HR 63 | Temp 97.6°F | Resp 19 | Ht 66.0 in | Wt 249.8 lb

## 2020-04-05 DIAGNOSIS — M818 Other osteoporosis without current pathological fracture: Secondary | ICD-10-CM | POA: Diagnosis not present

## 2020-04-05 DIAGNOSIS — M810 Age-related osteoporosis without current pathological fracture: Secondary | ICD-10-CM | POA: Diagnosis present

## 2020-04-05 DIAGNOSIS — D472 Monoclonal gammopathy: Secondary | ICD-10-CM

## 2020-04-05 LAB — COMPREHENSIVE METABOLIC PANEL
ALT: 21 U/L (ref 0–44)
AST: 24 U/L (ref 15–41)
Albumin: 3.7 g/dL (ref 3.5–5.0)
Alkaline Phosphatase: 58 U/L (ref 38–126)
Anion gap: 7 (ref 5–15)
BUN: 23 mg/dL (ref 8–23)
CO2: 26 mmol/L (ref 22–32)
Calcium: 9.4 mg/dL (ref 8.9–10.3)
Chloride: 106 mmol/L (ref 98–111)
Creatinine, Ser: 1.15 mg/dL (ref 0.61–1.24)
GFR, Estimated: >60 mL/min (ref >60)
Glucose, Bld: 149 mg/dL — ABNORMAL HIGH (ref 70–99)
Potassium: 4.2 mmol/L (ref 3.5–5.1)
Sodium: 139 mmol/L (ref 135–145)
Total Bilirubin: 0.7 mg/dL (ref 0.3–1.2)
Total Protein: 6.8 g/dL (ref 6.5–8.1)

## 2020-04-05 LAB — CBC WITH DIFFERENTIAL/PLATELET
Abs Immature Granulocytes: 0.02 10*3/uL (ref 0.00–0.07)
Basophils Absolute: 0 10*3/uL (ref 0.0–0.1)
Basophils Relative: 0 %
Eosinophils Absolute: 0.2 10*3/uL (ref 0.0–0.5)
Eosinophils Relative: 3 %
HCT: 34.3 % — ABNORMAL LOW (ref 39.0–52.0)
Hemoglobin: 11.5 g/dL — ABNORMAL LOW (ref 13.0–17.0)
Immature Granulocytes: 0 %
Lymphocytes Relative: 32 %
Lymphs Abs: 1.7 10*3/uL (ref 0.7–4.0)
MCH: 32.5 pg (ref 26.0–34.0)
MCHC: 33.5 g/dL (ref 30.0–36.0)
MCV: 96.9 fL (ref 80.0–100.0)
Monocytes Absolute: 0.5 10*3/uL (ref 0.1–1.0)
Monocytes Relative: 10 %
Neutro Abs: 2.9 10*3/uL (ref 1.7–7.7)
Neutrophils Relative %: 55 %
Platelets: 158 10*3/uL (ref 150–400)
RBC: 3.54 MIL/uL — ABNORMAL LOW (ref 4.22–5.81)
RDW: 15.6 % — ABNORMAL HIGH (ref 11.5–15.5)
WBC: 5.3 10*3/uL (ref 4.0–10.5)
nRBC: 0 % (ref 0.0–0.2)

## 2020-04-05 MED ORDER — DENOSUMAB 60 MG/ML ~~LOC~~ SOSY
PREFILLED_SYRINGE | SUBCUTANEOUS | Status: AC
  Filled 2020-04-05: qty 1

## 2020-04-05 MED ORDER — DENOSUMAB 60 MG/ML ~~LOC~~ SOSY
60.0000 mg | PREFILLED_SYRINGE | Freq: Once | SUBCUTANEOUS | Status: AC
  Administered 2020-04-05: 60 mg via SUBCUTANEOUS

## 2020-04-05 NOTE — Patient Instructions (Signed)
Denosumab injection What is this medicine? DENOSUMAB (den oh sue mab) slows bone breakdown. Prolia is used to treat osteoporosis in women after menopause and in men, and in people who are taking corticosteroids for 6 months or more. Xgeva is used to treat a high calcium level due to cancer and to prevent bone fractures and other bone problems caused by multiple myeloma or cancer bone metastases. Xgeva is also used to treat giant cell tumor of the bone. This medicine may be used for other purposes; ask your health care provider or pharmacist if you have questions. COMMON BRAND NAME(S): Prolia, XGEVA What should I tell my health care provider before I take this medicine? They need to know if you have any of these conditions:  dental disease  having surgery or tooth extraction  infection  kidney disease  low levels of calcium or Vitamin D in the blood  malnutrition  on hemodialysis  skin conditions or sensitivity  thyroid or parathyroid disease  an unusual reaction to denosumab, other medicines, foods, dyes, or preservatives  pregnant or trying to get pregnant  breast-feeding How should I use this medicine? This medicine is for injection under the skin. It is given by a health care professional in a hospital or clinic setting. A special MedGuide will be given to you before each treatment. Be sure to read this information carefully each time. For Prolia, talk to your pediatrician regarding the use of this medicine in children. Special care may be needed. For Xgeva, talk to your pediatrician regarding the use of this medicine in children. While this drug may be prescribed for children as young as 13 years for selected conditions, precautions do apply. Overdosage: If you think you have taken too much of this medicine contact a poison control center or emergency room at once. NOTE: This medicine is only for you. Do not share this medicine with others. What if I miss a dose? It is  important not to miss your dose. Call your doctor or health care professional if you are unable to keep an appointment. What may interact with this medicine? Do not take this medicine with any of the following medications:  other medicines containing denosumab This medicine may also interact with the following medications:  medicines that lower your chance of fighting infection  steroid medicines like prednisone or cortisone This list may not describe all possible interactions. Give your health care provider a list of all the medicines, herbs, non-prescription drugs, or dietary supplements you use. Also tell them if you smoke, drink alcohol, or use illegal drugs. Some items may interact with your medicine. What should I watch for while using this medicine? Visit your doctor or health care professional for regular checks on your progress. Your doctor or health care professional may order blood tests and other tests to see how you are doing. Call your doctor or health care professional for advice if you get a fever, chills or sore throat, or other symptoms of a cold or flu. Do not treat yourself. This drug may decrease your body's ability to fight infection. Try to avoid being around people who are sick. You should make sure you get enough calcium and vitamin D while you are taking this medicine, unless your doctor tells you not to. Discuss the foods you eat and the vitamins you take with your health care professional. See your dentist regularly. Brush and floss your teeth as directed. Before you have any dental work done, tell your dentist you are   receiving this medicine. Do not become pregnant while taking this medicine or for 5 months after stopping it. Talk with your doctor or health care professional about your birth control options while taking this medicine. Women should inform their doctor if they wish to become pregnant or think they might be pregnant. There is a potential for serious side  effects to an unborn child. Talk to your health care professional or pharmacist for more information. What side effects may I notice from receiving this medicine? Side effects that you should report to your doctor or health care professional as soon as possible:  allergic reactions like skin rash, itching or hives, swelling of the face, lips, or tongue  bone pain  breathing problems  dizziness  jaw pain, especially after dental work  redness, blistering, peeling of the skin  signs and symptoms of infection like fever or chills; cough; sore throat; pain or trouble passing urine  signs of low calcium like fast heartbeat, muscle cramps or muscle pain; pain, tingling, numbness in the hands or feet; seizures  unusual bleeding or bruising  unusually weak or tired Side effects that usually do not require medical attention (report to your doctor or health care professional if they continue or are bothersome):  constipation  diarrhea  headache  joint pain  loss of appetite  muscle pain  runny nose  tiredness  upset stomach This list may not describe all possible side effects. Call your doctor for medical advice about side effects. You may report side effects to FDA at 1-800-FDA-1088. Where should I keep my medicine? This medicine is only given in a clinic, doctor's office, or other health care setting and will not be stored at home. NOTE: This sheet is a summary. It may not cover all possible information. If you have questions about this medicine, talk to your doctor, pharmacist, or health care provider.  2021 Elsevier/Gold Standard (2017-05-23 16:10:44)
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
## 2020-04-06 ENCOUNTER — Encounter: Payer: Self-pay | Admitting: Cardiology

## 2020-04-06 ENCOUNTER — Ambulatory Visit: Payer: Medicare PPO | Admitting: Cardiology

## 2020-04-06 ENCOUNTER — Telehealth: Payer: Self-pay | Admitting: Oncology

## 2020-04-06 VITALS — BP 140/75 | HR 69 | Temp 98.0°F | Resp 16 | Ht 66.0 in | Wt 250.0 lb

## 2020-04-06 DIAGNOSIS — I5032 Chronic diastolic (congestive) heart failure: Secondary | ICD-10-CM

## 2020-04-06 DIAGNOSIS — I10 Essential (primary) hypertension: Secondary | ICD-10-CM

## 2020-04-06 DIAGNOSIS — E782 Mixed hyperlipidemia: Secondary | ICD-10-CM

## 2020-04-06 DIAGNOSIS — I2510 Atherosclerotic heart disease of native coronary artery without angina pectoris: Secondary | ICD-10-CM

## 2020-04-06 DIAGNOSIS — Z6841 Body Mass Index (BMI) 40.0 and over, adult: Secondary | ICD-10-CM

## 2020-04-06 LAB — KAPPA/LAMBDA LIGHT CHAINS
Kappa free light chain: 27.8 mg/L — ABNORMAL HIGH (ref 3.3–19.4)
Kappa, lambda light chain ratio: 2.6 — ABNORMAL HIGH (ref 0.26–1.65)
Lambda free light chains: 10.7 mg/L (ref 5.7–26.3)

## 2020-04-06 NOTE — Progress Notes (Signed)
Primary Physician/Referring:  Tamsen Roers, MD  Patient ID: [name] [name], male    DOB: 1932-10-06, 87 y.o.   MRN: [id number]  Chief Complaint  Patient presents with  . HTN (hypertension), benign  . Coronary artery disease involving native coronary artery of  . Follow-up   HPI:    [name] [name]  is a 87 y.o. Caucasian male with coronary disease with CABG in 2012, hypertension, mixed hyperlipidemia, hyperglycemia, h/o gout, OSA on CPAP, former smoker, morbid obesity, multiple myeloma in regression who presents to the office for 57-month follow-up of hypertension, hyperlipidemia and coronary artery disease.  States that he is presently doing well and he is trying his best to lose weight.  He has not had any further chest pain, dyspnea has remained stable, no pedal edema, no PND or orthopnea.  He was recently evaluated by Dr. Gunnar Bulla Magrinat and told to have complete regression of multiple myeloma and recommended annual SPEP evaluation only.   Past Medical History:  Diagnosis Date  . Complication of anesthesia    Pt difficult to intubate; 07/19/03 aborted surgery unable to intubate  . Coronary artery disease   . Diabetes mellitus without complication ([place])   . Dysthymic disorder   . Gout    Hx: of in left wrist  . Hearing deficit    Hx: of wears hearing aids  . Hypertension   . Kidney stones    Hx: of  . Multiple myeloma ([place])   . Occlusion and stenosis of unspecified carotid artery   . Pathologic fracture of vertebrae   . Pneumonia   . Rosacea   . Sleep apnea    Hx: of pt wears CPAP   Past Surgical History:  Procedure Laterality Date  . APPENDECTOMY    . BACK SURGERY  [phone]   lumb  . CARDIAC CATHETERIZATION     Hx: of 04/05/10  . CAROTID ARTERY - SUBCLAVIAN ARTERY BYPASS GRAFT  2004   Hx: of-left  . CARPAL TUNNEL RELEASE Right 04/13/2013   Procedure: RIGHT CARPAL TUNNEL RELEASE;  Surgeon: Cammie Sickle., MD;  Location: [place];  Service:  Orthopedics;  Laterality: Right;  . CATARACT EXTRACTION     Hx: of left eye only  . COLONOSCOPY    . COLONOSCOPY W/ BIOPSIES AND POLYPECTOMY     Hx: of  . CORONARY ARTERY BYPASS GRAFT  2012   Hx: of 5  . ELBOW SURGERY  1992   Hx: of-lt  . HERNIA REPAIR  1963   Hx: of right  . JOINT REPLACEMENT  2006   Hx: of left knee  . LUMBAR LAMINECTOMY/DECOMPRESSION MICRODISCECTOMY N/A 11/18/2012   Procedure: REVISION L2 - L4 DECOMPRESSION DISCECTOMY 2 LEVELS;  Surgeon: Melina Schools, MD;  Location: [facility];  Service: Orthopedics;  Laterality: N/A;  . ROTATOR CUFF REPAIR  2003   Hx: of right  . TONSILLECTOMY    . TRIGGER FINGER RELEASE Right 04/13/2013   Procedure: RIGHT THUMB A-1 PULLEY RELEASE;  Surgeon: Cammie Sickle., MD;  Location: [place];  Service: Orthopedics;  Laterality: Right;   Family History  Problem Relation Age of Onset  . Cancer - Colon Mother   . Heart disease Father   . Alzheimer's disease Brother   . Alzheimer's disease Sister   . Alzheimer's disease Brother   . Dementia Other     Social History   Tobacco Use  . Smoking status: Former Smoker    Packs/day: 0.50  Years: 30.00    Pack years: 15.00    Types: Cigarettes    Quit date: 04/08/1978    Years since quitting: 42.0  . Smokeless tobacco: Never Used  . Tobacco comment: Quit smoking cigarretes in 1980  Substance Use Topics  . Alcohol use: No    Alcohol/week: 0.0 standard drinks   Marital Status: Married  ROS  Review of Systems  Cardiovascular: Negative for dyspnea on exertion and syncope.  Respiratory: Positive for snoring.   Musculoskeletal: Positive for arthritis and joint pain. Negative for joint swelling.  Gastrointestinal: Negative for melena.   Objective  Blood pressure 140/75, pulse 69, temperature 98 F (36.7 C), temperature source Temporal, resp. rate 16, height $RemoveBe'5\' 6"'SSvyIUQDr$  (1.676 m), weight 250 lb (113.4 kg), SpO2 99 %.  Vitals with BMI 04/06/2020 04/06/2020 04/05/2020  Height -  $'5\' 6"'J$  $R'5\' 6"'hy$   Weight - 250 lbs 249 lbs 13 oz  BMI - 09.32 35.57  Systolic 322 025 427  Diastolic 75 78 49  Pulse 69 71 63     Physical Exam Constitutional:      Comments: Morbidly Obese  Neck:     Comments: Short neck difficult to evaluate JVP Cardiovascular:     Rate and Rhythm: Normal rate and regular rhythm.     Pulses: Intact distal pulses.          Carotid pulses are 2+ on the right side and 2+ on the left side.      Dorsalis pedis pulses are 2+ on the right side and 2+ on the left side.       Posterior tibial pulses are 2+ on the right side and 2+ on the left side.     Heart sounds: Normal heart sounds. No murmur heard. No gallop.      Comments: Femoral and popliteal pulse difficult to feel due to patient's body habitus.  Trace leg edema. No JVD. Pulmonary:     Effort: Pulmonary effort is normal. No accessory muscle usage or respiratory distress.     Breath sounds: Normal breath sounds.  Abdominal:     General: Bowel sounds are normal.     Palpations: Abdomen is soft.     Comments: Obese. Pannus present     Laboratory examination:   Recent Labs    04/22/19 1140 06/08/19 1157 09/29/19 1059 04/05/20 1053  NA 142 139 137 139  K 5.1 4.8 4.8 4.2  CL 104 102 107 106  CO2 $Re'25 25 22 26  'PfO$ GLUCOSE 117* 107* 119* 149*  BUN 24 23 27* 23  CREATININE 1.01 1.10 1.12 1.15  CALCIUM 9.4 10.2 10.3 9.4  GFRNONAA 68 61 60* >60  GFRAA 79 71 >60  --    estimated creatinine clearance is 55.5 mL/min (by C-G formula based on SCr of 1.15 mg/dL).  CMP Latest Ref Rng & Units 04/05/2020 09/29/2019 06/08/2019  Glucose 70 - 99 mg/dL 149(H) 119(H) 107(H)  BUN 8 - 23 mg/dL 23 27(H) 23  Creatinine 0.61 - 1.24 mg/dL 1.15 1.12 1.10  Sodium 135 - 145 mmol/L 139 137 139  Potassium 3.5 - 5.1 mmol/L 4.2 4.8 4.8  Chloride 98 - 111 mmol/L 106 107 102  CO2 22 - 32 mmol/L $RemoveB'26 22 25  'iLlJpboD$ Calcium 8.9 - 10.3 mg/dL 9.4 10.3 10.2  Total Protein 6.5 - 8.1 g/dL 6.8 7.4 -  Total Bilirubin 0.3 - 1.2 mg/dL 0.7 0.8 -   Alkaline Phos 38 - 126 U/L 58 57 -  AST 15 - 41 U/L  24 20 -  ALT 0 - 44 U/L 21 12 -   CBC Latest Ref Rng & Units 04/05/2020 09/29/2019 04/05/2019  WBC 4.0 - 10.5 K/uL 5.3 7.0 6.8  Hemoglobin 13.0 - 17.0 g/dL 11.5(L) 13.0 14.9  Hematocrit 39.0 - 52.0 % 34.3(L) 37.6(L) 43.9  Platelets 150 - 400 K/uL 158 149(L) 164   BNP (last 3 results) Recent Labs    06/08/19 1158  BNP 234.3*   Lipid Panel No results for input(s): CHOL, TRIG, LDLCALC, VLDL, HDL, CHOLHDL, LDLDIRECT in the last 8760 hours.      External labs:  Hemoglobin 14.900 04/05/2019 Platelets 164.000 04/05/2019  Creatinine, Serum 1.010 04/22/2019 Potassium 5.100 04/22/2019 ALT (SGPT) 15.000 04/05/2019  Medications and allergies   Allergies  Allergen Reactions  . Amlodipine Swelling  . Ciprofloxacin   . Hydralazine Other (See Comments)    Leg edema  . Hydrocodone Nausea Only    Ok taking in low doses($RemoveBefo'5mg'WweOzKJltiG$ ), has nausea with higher doses  . Levofloxacin Other (See Comments)    Pain/cramping   . Oxycodone Nausea Only  . Tramadol Nausea Only  . Cardizem [Diltiazem Hcl] Rash    Outpatient Medications Prior to Visit  Medication Sig Dispense Refill  . acetaminophen (TYLENOL) 325 MG tablet Take 650 mg by mouth every 6 (six) hours as needed.    [name] allopurinol (ZYLOPRIM) 300 MG tablet Take 300 mg by mouth daily. In the morning    . amoxicillin (AMOXIL) 500 MG capsule Take 4 capsules by mouth as directed. Take 4 capsules 1 hour prior to dental procedure    . aspirin EC 81 MG tablet Take 81 mg by mouth daily.    . Calcium 600-200 MG-UNIT tablet Take 1 tablet by mouth daily.    . cetirizine (ZYRTEC) 10 MG tablet Take 10 mg by mouth daily.    . cromolyn (OPTICROM) 4 % ophthalmic solution PLACE 1 DROP INTO BOTH EYES 2 TO 4 TIMES A DAY  2  . denosumab (PROLIA) 60 MG/ML SOSY injection Every 6 months    . docusate sodium (COLACE) 100 MG capsule Take 100 mg by mouth 2 (two) times daily.    . fluticasone (FLONASE) 50 MCG/ACT nasal spray as  needed.    . gabapentin (NEURONTIN) 100 MG capsule Take 100 mg by mouth as needed. Taking it almost everyday currently    . hydrochlorothiazide (HYDRODIURIL) 25 MG tablet TAKE 1 TABLET BY MOUTH EVERY DAY 90 tablet 1  . isosorbide dinitrate (ISORDIL) 30 MG tablet TAKE 1 TABLET BY MOUTH THREE TIMES A DAY 90 tablet 2  . labetalol (NORMODYNE) 200 MG tablet TAKE 1 TABLET BY MOUTH TWICE A DAY 60 tablet 3  . levothyroxine (SYNTHROID, LEVOTHROID) 100 MCG tablet Take 100 mcg by mouth daily before breakfast.    . losartan (COZAAR) 100 MG tablet TAKE 1 TABLET BY MOUTH EVERY DAY 90 tablet 1  . Multiple Vitamin (MULTIVITAMIN) tablet Take 1 tablet by mouth daily.    . Multiple Vitamins-Minerals (PRESERVISION AREDS PO) in the morning and at bedtime.     . nitroGLYCERIN (NITROSTAT) 0.4 MG SL tablet Place 1 tablet (0.4 mg total) under the tongue every 5 (five) minutes as needed for chest pain. If your symptoms continue after taking two tablets 5 minutes apart call 911. 90 tablet 3  . Omega-3 Fatty Acids (FISH OIL) 1000 MG CAPS Take 2 capsules by mouth in the morning and at bedtime.     Vladimir Faster Glycol-Propyl Glycol (SYSTANE OP) Apply 1 drop to  eye 2 (two) times daily. Each eye    . rosuvastatin (CRESTOR) 20 MG tablet Take 20 mg by mouth at bedtime.    [name] spironolactone (ALDACTONE) 25 MG tablet TAKE 1 TABLET (25 MG TOTAL) BY MOUTH IN THE MORNING. 90 tablet 2  . testosterone enanthate (DELATESTRYL) 200 MG/ML injection Inject into the muscle every 14 (fourteen) days. For IM use only    . Vitamin D, Ergocalciferol, (DRISDOL) 50000 UNITS CAPS Take 50,000 Units by mouth every 7 (seven) days.      No facility-administered medications prior to visit.   Radiology:   No results found.  Cardiac Studies:   Cardiac Cath: 04/05/2010: 3 Vessel CAD; RCA 99% mid; Mid Cx; 80%, Ostial LAD 80%, Mid 90% S/P CABG  x5  04/09/10  LIMA to the LAD, SVG to D1, SVG to OM1 & OM3, SVG to Rt PDA.  Carotid duplex 09/07/2012: No evidence  of hemodynamically significant stenosis in the bilateral carotid bifurcation vessels.  There is evidence of heterogeneous plaque in the bilateral carotid artery.  Mild bilateral intimal thickening. Left carotid endarterectomy is widely patient. No significant change since 09/14/11.  Echo 04/07/2019: LVEF 55 to 60%, Moderate LVH, indeterminate diastolic filling pattern, moderately dilated left atrium, mild MR, mild TR, mild PR.  Lexiscan Tetrofosmin Stress Test  04/12/2019: Nondiagnostic ECG stress. Resting EKG demonstrated normal sinus rhythm, high lateral infarct, old. No ST-T wave abnormalities.   Left ventricle is normal in is mildly enlarged in both rest and stress images.  There is moderate diaphragmatic attenuation artifact in the inferior wall.  A very small sized mid inferolateral nontransmural scar cannot be completely excluded. Dynamic gated images reveal normal wall motion, stress LVEF was 60%. Low risk study.   EKG    EKG 04/06/2020: Normal sinus rhythm at rate of 71 bpm, left atrial enlargement, normal axis.  Incomplete right bundle branch block.  Poor R wave progression, probably normal variant.  No evidence of ischemia, normal EKG. no significant change from 11/27/2018, borderline first-degree AV block and previously heart rate was 59 bpm.  Assessment     ICD-10-CM   1. HTN (hypertension), benign  I10   2. Coronary artery disease involving native coronary artery of native heart without angina pectoris  I25.10 EKG 12-Lead  3. Chronic diastolic heart failure (HCC)  I50.32   4. Class 3 severe obesity due to excess calories with serious comorbidity and body mass index (BMI) of 40.0 to 44.9 in adult (HCC)  E66.01    Z68.41   5. Mixed hyperlipidemia  E78.2 Lipid Panel With LDL/HDL Ratio    No orders of the defined types were placed in this encounter.  There are no discontinued medications.  Orders Placed This Encounter  Procedures  . Lipid Panel With LDL/HDL Ratio  . EKG 12-Lead     Recommendations:   [name] [name]  is a 87 y.o. Caucasian male with coronary disease with CABG in 2012, hypertension, mixed hyperlipidemia, hyperglycemia, h/o gout, OSA on CPAP, former smoker, morbid obesity, multiple myeloma in regression who presents to the office for 62-month follow-up of hypertension, hyperlipidemia and coronary artery disease.  Since being on labetalol 200 mg p.o. twice daily, blood pressure is now normalized for age, also his heart rate is increased and with resolution of first-degree AV block since discontinuing Zebeta.  There is no clinical evidence of heart failure, he is presently doing well without recurrence of angina. Will check lipids.  I did receive a call from his hematologist Dr.  Gus Magrinat (March 2022) regarding performing SPEP on annual basis and that he would see him back on a as needed basis as he is stable.  I have agreed upon this. I will see "[name] [name]" back in 8 months for cardiac follow up.    Adrian Prows, MD, [facility] 04/08/2020, 3:10 PM Office: [phone]

## 2020-04-06 NOTE — Telephone Encounter (Signed)
No 3/9 los. No changes made to pt's schedule.

## 2020-04-07 LAB — PROTEIN ELECTROPHORESIS, SERUM
A/G Ratio: 1.3 (ref 0.7–1.7)
Albumin ELP: 3.6 g/dL (ref 2.9–4.4)
Alpha-1-Globulin: 0.2 g/dL (ref 0.0–0.4)
Alpha-2-Globulin: 0.8 g/dL (ref 0.4–1.0)
Beta Globulin: 0.8 g/dL (ref 0.7–1.3)
Gamma Globulin: 1.1 g/dL (ref 0.4–1.8)
Globulin, Total: 2.8 g/dL (ref 2.2–3.9)
M-Spike, %: 0.7 g/dL — ABNORMAL HIGH
Total Protein ELP: 6.4 g/dL (ref 6.0–8.5)

## 2020-04-15 LAB — BASIC METABOLIC PANEL
BUN/Creatinine Ratio: 21 (ref 10–24)
BUN: 28 mg/dL — ABNORMAL HIGH (ref 8–27)
CO2: 21 mmol/L (ref 20–29)
Calcium: 9.8 mg/dL (ref 8.6–10.2)
Chloride: 103 mmol/L (ref 96–106)
Creatinine, Ser: 1.31 mg/dL — ABNORMAL HIGH (ref 0.76–1.27)
Glucose: 118 mg/dL — ABNORMAL HIGH (ref 65–99)
Potassium: 4.5 mmol/L (ref 3.5–5.2)
Sodium: 139 mmol/L (ref 134–144)
eGFR: 53 mL/min/{1.73_m2} — ABNORMAL LOW (ref >59)

## 2020-04-15 LAB — LIPID PANEL WITH LDL/HDL RATIO
Cholesterol, Total: 102 mg/dL (ref 100–199)
HDL: 31 mg/dL — ABNORMAL LOW (ref >39)
LDL Chol Calc (NIH): 46 mg/dL (ref 0–99)
LDL/HDL Ratio: 1.5 ratio (ref 0.0–3.6)
Triglycerides: 144 mg/dL (ref 0–149)
VLDL Cholesterol Cal: 25 mg/dL (ref 5–40)

## 2020-04-15 LAB — MAGNESIUM: Magnesium: 2 mg/dL (ref 1.6–2.3)

## 2020-04-20 ENCOUNTER — Other Ambulatory Visit: Payer: Self-pay | Admitting: Cardiology

## 2020-04-20 DIAGNOSIS — I10 Essential (primary) hypertension: Secondary | ICD-10-CM

## 2020-04-20 NOTE — Progress Notes (Signed)
Mild decrease in kidney function that is new from previous.  Potassium level is now back to normal.  Magnesium is also normal.  Would recommend rechecking kidney function in 1 month, I will place an order.  Creatinine, Serum 1.010 04/22/2019 Potassium 5.100 04/22/2019 ALT (SGPT) 15.000 04/05/2019

## 2020-04-23 ENCOUNTER — Other Ambulatory Visit: Payer: Self-pay | Admitting: Cardiology

## 2020-05-30 ENCOUNTER — Other Ambulatory Visit: Payer: Self-pay | Admitting: Cardiology

## 2020-05-30 DIAGNOSIS — I10 Essential (primary) hypertension: Secondary | ICD-10-CM

## 2020-06-18 ENCOUNTER — Other Ambulatory Visit: Payer: Self-pay | Admitting: Cardiology

## 2020-06-18 DIAGNOSIS — I2510 Atherosclerotic heart disease of native coronary artery without angina pectoris: Secondary | ICD-10-CM

## 2020-06-18 DIAGNOSIS — I10 Essential (primary) hypertension: Secondary | ICD-10-CM

## 2020-07-25 ENCOUNTER — Other Ambulatory Visit: Payer: Self-pay | Admitting: Cardiology

## 2020-08-03 ENCOUNTER — Encounter: Payer: Self-pay | Admitting: Cardiology

## 2020-08-10 ENCOUNTER — Other Ambulatory Visit (HOSPITAL_COMMUNITY): Payer: Medicare PPO

## 2020-08-14 ENCOUNTER — Ambulatory Visit: Admit: 2020-08-14 | Payer: Medicare PPO | Admitting: Orthopedic Surgery

## 2020-08-14 SURGERY — ARTHROPLASTY, KNEE, TOTAL
Anesthesia: Choice | Site: Knee | Laterality: Right

## 2020-08-17 ENCOUNTER — Other Ambulatory Visit (HOSPITAL_COMMUNITY): Payer: Medicare PPO

## 2020-08-22 ENCOUNTER — Other Ambulatory Visit: Payer: Self-pay | Admitting: Cardiology

## 2020-08-22 DIAGNOSIS — I2510 Atherosclerotic heart disease of native coronary artery without angina pectoris: Secondary | ICD-10-CM

## 2020-08-22 DIAGNOSIS — I10 Essential (primary) hypertension: Secondary | ICD-10-CM

## 2020-09-15 ENCOUNTER — Other Ambulatory Visit: Payer: Self-pay | Admitting: Cardiology

## 2020-09-15 DIAGNOSIS — I10 Essential (primary) hypertension: Secondary | ICD-10-CM

## 2020-10-12 ENCOUNTER — Ambulatory Visit: Payer: Medicare PPO | Admitting: Cardiology

## 2020-10-12 ENCOUNTER — Other Ambulatory Visit: Payer: Self-pay

## 2020-10-12 ENCOUNTER — Encounter: Payer: Self-pay | Admitting: Cardiology

## 2020-10-12 VITALS — BP 123/61 | HR 73 | Temp 98.0°F | Resp 17 | Ht 66.0 in | Wt 227.6 lb

## 2020-10-12 DIAGNOSIS — I2510 Atherosclerotic heart disease of native coronary artery without angina pectoris: Secondary | ICD-10-CM

## 2020-10-12 DIAGNOSIS — E782 Mixed hyperlipidemia: Secondary | ICD-10-CM

## 2020-10-12 DIAGNOSIS — I5032 Chronic diastolic (congestive) heart failure: Secondary | ICD-10-CM

## 2020-10-12 DIAGNOSIS — I10 Essential (primary) hypertension: Secondary | ICD-10-CM

## 2020-10-12 DIAGNOSIS — E291 Testicular hypofunction: Secondary | ICD-10-CM

## 2020-10-12 DIAGNOSIS — D472 Monoclonal gammopathy: Secondary | ICD-10-CM

## 2020-10-12 MED ORDER — TESTOSTERONE CYPIONATE 200 MG/ML IM SOLN
200.0000 mg | INTRAMUSCULAR | Status: DC
  Administered 2020-10-12: 200 mg via INTRAMUSCULAR

## 2020-10-12 NOTE — Progress Notes (Signed)
Primary Physician/Referring:  Tamsen Roers, MD  Patient ID: [name] [name], male    DOB: [birth year], 87 y.o.   MRN: [id number]  Chief Complaint  Patient presents with   Follow-up    6 month   Coronary Artery Disease   Hypertension   Bradycardia   HPI:    [name] [name]  is a 87 y.o. Caucasian male with coronary disease with CABG in 2012, hypertension, mixed hyperlipidemia, hyperglycemia, h/o gout, OSA on CPAP, former smoker, morbid obesity, multiple myeloma in regression who presents to the office for 26-monthfollow-up of hypertension, hyperlipidemia and coronary artery disease.  States that he is presently doing well and he is trying his best to lose weight.  He has lost about 25 pounds in weight.  He was scheduled for right knee arthroplasty however patient decided to cancel this.  Continues to have chronic back pain.  He has not had any further chest pain, dyspnea has remained stable, no pedal edema, no PND or orthopnea.  He was recently evaluated by Dr. GGunnar BullaMagrinat and told to have complete regression of multiple myeloma and recommended annual SPEP evaluation only.   Past Medical History:  Diagnosis Date   Complication of anesthesia    Pt difficult to intubate; 07/19/03 aborted surgery unable to intubate   Coronary artery disease    Diabetes mellitus without complication [place]    Dysthymic disorder    Gout    Hx: of in left wrist   Hearing deficit    Hx: of wears hearing aids   Hypertension    Kidney stones    Hx: of   [place]    Occlusion and stenosis of unspecified carotid artery    Pathologic fracture of vertebrae    Pneumonia    Rosacea    Sleep apnea    Hx: of pt wears CPAP   Past Surgical History:  Procedure Laterality Date   APPENDECTOMY     BACK SURGERY  [id number]  lumb   CARDIAC CATHETERIZATION     Hx: of 04/05/10   CAROTID ARTERY - SUBCLAVIAN ARTERY BYPASS GRAFT  2004   Hx: of-left   CARPAL TUNNEL RELEASE Right 04/13/2013   Procedure:  RIGHT CARPAL TUNNEL RELEASE;  Surgeon: RCammie Sickle, MD;  [place] [facility]  Service: Orthopedics;  Laterality: Right;   CATARACT EXTRACTION     Hx: of left eye only   COLONOSCOPY     COLONOSCOPY W/ BIOPSIES AND POLYPECTOMY     Hx: of   CORONARY ARTERY BYPASS GRAFT  2012   Hx: of 5Robert [name]  Hx: of-lt   HERNIA REPAIR  1963   Hx: of right   JOINT REPLACEMENT  2006   Hx: of left knee   LUMBAR LAMINECTOMY/DECOMPRESSION MICRODISCECTOMY N/A 11/18/2012   Procedure: REVISION L2 - L4 DECOMPRESSION DISCECTOMY 2 LEVELS;  Surgeon: DMelina Schools MD;  [place] [facility]  Service: Orthopedics;  Laterality: N/A;   ROTATOR CUFF REPAIR  2003   Hx: of right   TONSILLECTOMY     TRIGGER FINGER RELEASE Right 04/13/2013   Procedure: RIGHT THUMB A-1 PULLEY RELEASE;  Surgeon: RCammie Sickle, MD;  [place] [facility]  Service: Orthopedics;  Laterality: Right;   Family History  Problem Relation Age of Onset   Cancer - Colon Mother    Heart disease Father    Alzheimer's disease Sister    Stroke Sister  6 or 7   Alzheimer's disease Brother    Alzheimer's disease Brother    Dementia Other     Social History   Tobacco Use   Smoking status: Former    Packs/day: 0.50    Years: 30.00    Pack years: 15.00    Types: Cigarettes    Quit date: 04/08/1978    Years since quitting: 42.5   Smokeless tobacco: Never   Tobacco comments:    Quit smoking cigarretes in 1980  Substance Use Topics   Alcohol use: No    Alcohol/week: 0.0 standard drinks   Marital Status: Married  ROS  Review of Systems  Constitutional: Positive for weight loss (Intentional).  Cardiovascular:  Negative for chest pain, claudication, dyspnea on exertion and syncope.  Respiratory:  Positive for snoring.   Musculoskeletal:  Positive for arthritis, back pain and joint pain.  Gastrointestinal:  Negative for melena.  Objective  Blood pressure 123/61, pulse 73,  temperature 98 F (36.7 C), temperature source Temporal, resp. rate 17, height _0  (1.676 m), weight 227 lb 9.6 oz (103.2 kg), SpO2 95 %.  Vitals with BMI 10/12/2020 10/12/2020 04/06/2020  Height - _1  -  Weight - 227 lbs 10 oz -  BMI - 10.93 -  Systolic 235 573 220  Diastolic 61 55 75  Pulse 73 72 69     Physical Exam Constitutional:      Comments: Morbidly Obese  Neck:     Vascular: No carotid bruit or JVD.     Comments: Short neck difficult to evaluate JVP Cardiovascular:     Rate and Rhythm: Normal rate and regular rhythm.     Pulses: Normal pulses and intact distal pulses.          Dorsalis pedis pulses are 2+ on the right side and 2+ on the left side.       Posterior tibial pulses are 2+ on the right side and 2+ on the left side.     Heart sounds: Normal heart sounds. No murmur heard.   No gallop.  Pulmonary:     Effort: Pulmonary effort is normal. No accessory muscle usage or respiratory distress.     Breath sounds: Normal breath sounds.  Abdominal:     General: Bowel sounds are normal.     Palpations: Abdomen is soft.     Comments: Obese. Pannus present   Musculoskeletal:     Right lower leg: No edema.     Left lower leg: No edema.  Skin:    Capillary Refill: Capillary refill takes less than 2 seconds.   Laboratory examination:   Recent Labs    04/05/20 1053 04/14/20 0902  NA 139 139  K 4.2 4.5  CL 106 103  CO2 26 21  GLUCOSE 149* 118*  BUN 23 28*  CREATININE 1.15 1.31*  CALCIUM 9.4 9.8  GFRNONAA >60  --    CrCl cannot be calculated (Patient's most recent lab result is older than the maximum 21 days allowed.).  CMP Latest Ref Rng & Units 04/14/2020 04/05/2020 09/29/2019  Glucose 65 - 99 mg/dL 118(H) 149(H) 119(H)  BUN 8 - 27 mg/dL 28(H) 23 27(H)  Creatinine 0.76 - 1.27 mg/dL 1.31(H) 1.15 1.12  Sodium 134 - 144 mmol/L 139 139 137  Potassium 3.5 - 5.2 mmol/L 4.5 4.2 4.8  Chloride 96 - 106 mmol/L 103 106 107  CO2 20 - 29 mmol/L _2 Calcium 8.6 -  10.2 mg/dL 9.8 9.4 10.3  Total Protein 6.5 - 8.1 g/dL - 6.8 7.4  Total Bilirubin 0.3 - 1.2 mg/dL - 0.7 0.8  Alkaline Phos 38 - 126 U/L - 58 57  AST 15 - 41 U/L - 24 20  ALT 0 - 44 U/L - 21 12   CBC Latest Ref Rng & Units 04/05/2020 09/29/2019 04/05/2019  WBC 4.0 - 10.5 K/uL 5.3 7.0 6.8  Hemoglobin 13.0 - 17.0 g/dL 11.5(L) 13.0 14.9  Hematocrit 39.0 - 52.0 % 34.3(L) 37.6(L) 43.9  Platelets 150 - 400 K/uL 158 149(L) 164   BNP (last 3 results) No results for input(s): BNP in the last 8760 hours.  Lipid Panel Recent Labs    04/14/20 0901  CHOL 102  TRIG 144  LDLCALC 46  HDL 31*   External labs:  Hemoglobin 14.900 04/05/2019 Platelets 164.000 04/05/2019  Creatinine, Serum 1.010 04/22/2019 Potassium 5.100 04/22/2019 ALT (SGPT) 15.000 04/05/2019  Medications and allergies   Allergies  Allergen Reactions   Amlodipine Swelling   Ciprofloxacin    Hydralazine Other (See Comments)    Leg edema   Hydrocodone Nausea Only    Ok taking in low doses(61m), has nausea with higher doses   Levofloxacin Other (See Comments)    Pain/cramping    Oxycodone Nausea Only   Tramadol Nausea Only   Cardizem [Diltiazem Hcl] Rash    Outpatient Medications Prior to Visit  Medication Sig Dispense Refill   acetaminophen (TYLENOL) 325 MG tablet Take 650 mg by mouth every 6 (six) hours as needed.     allopurinol (ZYLOPRIM) 300 MG tablet Take 300 mg by mouth daily. In the morning     aspirin EC 81 MG tablet Take 81 mg by mouth daily.     Calcium Carb-Cholecalciferol (CALCIUM-VITAMIN D3) 600-200 MG-UNIT TABS Take 1 tablet by mouth daily.     cetirizine (ZYRTEC) 10 MG tablet Take 10 mg by mouth daily.     cromolyn (OPTICROM) 4 % ophthalmic solution PLACE 1 DROP INTO BOTH EYES 2 TO 4 TIMES A DAY  2   docusate sodium (COLACE) 100 MG capsule Take 100 mg by mouth 2 (two) times daily.     fluticasone (FLONASE) 50 MCG/ACT nasal spray as needed.     gabapentin (NEURONTIN) 100 MG capsule Take 100 mg by mouth as needed.  Taking it almost everyday currently     hydrochlorothiazide (HYDRODIURIL) 25 MG tablet TAKE 1 TABLET BY MOUTH EVERY DAY 90 tablet 1   isosorbide dinitrate (ISORDIL) 30 MG tablet TAKE 1 TABLET BY MOUTH THREE TIMES A DAY 90 tablet 1   labetalol (NORMODYNE) 200 MG tablet TAKE 1 TABLET BY MOUTH TWICE A DAY 60 tablet 3   levothyroxine (SYNTHROID, LEVOTHROID) 100 MCG tablet Take 100 mcg by mouth daily before breakfast.     losartan (COZAAR) 100 MG tablet TAKE 1 TABLET BY MOUTH EVERY DAY 90 tablet 1   Multiple Vitamin (MULTIVITAMIN) tablet Take 1 tablet by mouth daily.     Multiple Vitamins-Minerals (PRESERVISION AREDS PO) in the morning and at bedtime.      nitroGLYCERIN (NITROSTAT) 0.4 MG SL tablet Place 1 tablet (0.4 mg total) under the tongue every 5 (five) minutes as needed for chest pain. If your symptoms continue after taking two tablets 5 minutes apart call 911. 90 tablet 3   Omega-3 Fatty Acids (FISH OIL) 1000 MG CAPS Take 2 capsules by mouth in the morning and at bedtime.      Polyethyl Glycol-Propyl Glycol (SYSTANE OP) Apply 1 drop to eye  2 (two) times daily. Each eye     rosuvastatin (CRESTOR) 20 MG tablet Take 20 mg by mouth at bedtime.     spironolactone (ALDACTONE) 25 MG tablet TAKE 1 TABLET (25 MG TOTAL) BY MOUTH IN THE MORNING. 90 tablet 2   testosterone enanthate (DELATESTRYL) 200 MG/ML injection Inject into the muscle every 14 (fourteen) days. For IM use only     Vitamin D, Ergocalciferol, (DRISDOL) 50000 UNITS CAPS Take 50,000 Units by mouth every 7 (seven) days.      amoxicillin (AMOXIL) 500 MG capsule Take 4 capsules by mouth as directed. Take 4 capsules 1 hour prior to dental procedure     Calcium 600-200 MG-UNIT tablet Take 1 tablet by mouth daily.     denosumab (PROLIA) 60 MG/ML SOSY injection Every 6 months     No facility-administered medications prior to visit.   Radiology:   No results found.  Cardiac Studies:   Cardiac Cath: 04/05/2010: 3 Vessel CAD; RCA 99% mid; Mid  Cx; 80%, Ostial LAD 80%, Mid 90% S/P CABG  x5  04/09/10  LIMA to the LAD, SVG to D1, SVG to OM1 & OM3, SVG to Rt PDA.  Carotid duplex 09/07/2012: No evidence of hemodynamically significant stenosis in the bilateral carotid bifurcation vessels.  There is evidence of heterogeneous plaque in the bilateral carotid artery.  Mild bilateral intimal thickening. Left carotid endarterectomy is widely patient. No significant change since 09/14/11.  Echo 04/07/2019: LVEF 55 to 60%, Moderate LVH, indeterminate diastolic filling pattern, moderately dilated left atrium, mild MR, mild TR, mild PR.  Lexiscan Tetrofosmin Stress Test  04/12/2019: Nondiagnostic ECG stress. Resting EKG demonstrated normal sinus rhythm, high lateral infarct, old. No ST-T wave abnormalities.   Left ventricle is normal in is mildly enlarged in both rest and stress images.  There is moderate diaphragmatic attenuation artifact in the inferior wall.  A very small sized mid inferolateral nontransmural scar cannot be completely excluded. Dynamic gated images reveal normal wall motion, stress LVEF was 60%. Low risk study.   EKG  EKG 10/12/2020: Normal sinus rhythm at the rate of 72 bpm, normal axis, incomplete right branch block.  Poor IV progression, probably normal.  No evidence of ischemia.  No significant change from 04/06/2020.  Assessment     ICD-10-CM   1. Coronary artery disease involving native coronary artery of native heart without angina pectoris  I25.10     2. Chronic diastolic heart failure (HCC)  I50.32     3. HTN (hypertension), benign  I10 EKG 12-Lead    CMP14+EGFR    CBC    4. Hypogonadism in male  E29.1 testosterone cypionate (DEPOTESTOSTERONE CYPIONATE) injection 200 mg    TSH    5. Mixed hyperlipidemia  E78.2 Lipid Panel With LDL/HDL Ratio    6. MGUS (monoclonal gammopathy of unknown significance)  D47.2       Meds ordered this encounter  Medications   testosterone cypionate (DEPOTESTOSTERONE CYPIONATE)  injection 200 mg    Medications Discontinued During This Encounter  Medication Reason   Calcium 600-200 MG-UNIT tablet Error   amoxicillin (AMOXIL) 500 MG capsule Error   denosumab (PROLIA) 60 MG/ML SOSY injection Error    Orders Placed This Encounter  Procedures   Lipid Panel With LDL/HDL Ratio   CMP14+EGFR   CBC   TSH   EKG 12-Lead    Administration Action Time Recorded Time Documented By Site Comment Reason Patient Supplied  Given : 200 mg :   : Intramuscular 10/12/20 1526  10/12/20 1621 Gaye Alken, CMA Right Deltoid   Yes   Recommendations:   DURAN OHERN  is a 87 y.o. Caucasian male with coronary disease with CABG in 2012, hypertension, mixed hyperlipidemia, hyperglycemia, h/o gout, OSA on CPAP, former smoker, morbid obesity, multiple myeloma in regression who presents to the office for 15-monthfollow-up of hypertension, hyperlipidemia and coronary artery disease.  He remains angina free and there is no clinical evidence of heart failure.  Blood pressure is now well controlled on labetalol and EKG reveals no changes from previous and also no heart block.  Lipids are well controlled.  He does have hypogonad is him and takes 200 mg of IM testosterone, as he does not have a PCP, I given him intramuscular injection today, patient supplied.  He plans to establish with a PCP soon.  He was scheduled for right knee total knee arthroplasty however he canceled the procedure.  He is now working on his weight, has lost about 25 pounds in weight.  Positive reinforcement was given.  He has been told to be in remission from multiple myeloma and was recommended annual SPEP and if abnormal to refer him back to Dr. GGunnar BullaMagrinat.  I have agreed to do this however if he were to establish with a PCP can certainly follow-up with that.  He needs this in the next 6 months.  Routine labs ordered today.  I will see him back in 6 months.   JAdrian Prows MD, [facility]9/15/2022, 3:35 PM Office: [phone]

## 2020-10-17 ENCOUNTER — Other Ambulatory Visit: Payer: Self-pay | Admitting: Cardiology

## 2020-10-17 DIAGNOSIS — I2510 Atherosclerotic heart disease of native coronary artery without angina pectoris: Secondary | ICD-10-CM

## 2020-10-17 DIAGNOSIS — I10 Essential (primary) hypertension: Secondary | ICD-10-CM

## 2020-10-19 ENCOUNTER — Other Ambulatory Visit: Payer: Self-pay | Admitting: Cardiology

## 2020-10-21 LAB — CMP14+EGFR
ALT: 16 IU/L (ref 0–44)
AST: 24 IU/L (ref 0–40)
Albumin/Globulin Ratio: 1.8 (ref 1.2–2.2)
Albumin: 4.4 g/dL (ref 3.6–4.6)
Alkaline Phosphatase: 82 IU/L (ref 44–121)
BUN/Creatinine Ratio: 23 (ref 10–24)
BUN: 26 mg/dL (ref 8–27)
Bilirubin Total: 0.6 mg/dL (ref 0.0–1.2)
CO2: 23 mmol/L (ref 20–29)
Calcium: 10.5 mg/dL — ABNORMAL HIGH (ref 8.6–10.2)
Chloride: 103 mmol/L (ref 96–106)
Creatinine, Ser: 1.14 mg/dL (ref 0.76–1.27)
Globulin, Total: 2.5 g/dL (ref 1.5–4.5)
Glucose: 111 mg/dL — ABNORMAL HIGH (ref 65–99)
Potassium: 5 mmol/L (ref 3.5–5.2)
Sodium: 141 mmol/L (ref 134–144)
Total Protein: 6.9 g/dL (ref 6.0–8.5)
eGFR: 63 mL/min/{1.73_m2} (ref >59)

## 2020-10-21 LAB — LIPID PANEL WITH LDL/HDL RATIO
Cholesterol, Total: 108 mg/dL (ref 100–199)
HDL: 34 mg/dL — ABNORMAL LOW (ref >39)
LDL Chol Calc (NIH): 50 mg/dL (ref 0–99)
LDL/HDL Ratio: 1.5 ratio (ref 0.0–3.6)
Triglycerides: 138 mg/dL (ref 0–149)
VLDL Cholesterol Cal: 24 mg/dL (ref 5–40)

## 2020-10-21 LAB — CBC
Hematocrit: 38.9 % (ref 37.5–51.0)
Hemoglobin: 13.1 g/dL (ref 13.0–17.7)
MCH: 33.3 pg — ABNORMAL HIGH (ref 26.6–33.0)
MCHC: 33.7 g/dL (ref 31.5–35.7)
MCV: 99 fL — ABNORMAL HIGH (ref 79–97)
Platelets: 159 10*3/uL (ref 150–450)
RBC: 3.93 x10E6/uL — ABNORMAL LOW (ref 4.14–5.80)
RDW: 14.8 % (ref 11.6–15.4)
WBC: 6.6 10*3/uL (ref 3.4–10.8)

## 2020-10-21 LAB — TSH: TSH: 1.91 u[IU]/mL (ref 0.450–4.500)

## 2020-11-21 ENCOUNTER — Other Ambulatory Visit: Payer: Self-pay | Admitting: Cardiology

## 2020-11-21 DIAGNOSIS — I10 Essential (primary) hypertension: Secondary | ICD-10-CM

## 2020-12-03 ENCOUNTER — Encounter: Payer: Self-pay | Admitting: Cardiology

## 2020-12-06 ENCOUNTER — Ambulatory Visit: Payer: Medicare PPO | Admitting: Cardiology

## 2021-01-08 ENCOUNTER — Other Ambulatory Visit: Payer: Self-pay | Admitting: Cardiology

## 2021-01-08 DIAGNOSIS — I10 Essential (primary) hypertension: Secondary | ICD-10-CM

## 2021-02-07 ENCOUNTER — Other Ambulatory Visit: Payer: Self-pay | Admitting: Cardiology

## 2021-02-07 DIAGNOSIS — I10 Essential (primary) hypertension: Secondary | ICD-10-CM

## 2021-02-07 DIAGNOSIS — I2510 Atherosclerotic heart disease of native coronary artery without angina pectoris: Secondary | ICD-10-CM

## 2021-02-27 NOTE — Progress Notes (Addendum)
COVID swab appointment: 03/08/21 @ 0915  COVID Vaccine Completed: yes x3 Date COVID Vaccine completed: 02/12/19, 03/05/19 Has received booster: 11/22/19 COVID vaccine manufacturer: Pfizer      Date of COVID positive in last 90 days: no  PCP - Tamsen Roers, MD Cardiologist - Adrian Prows, MD  Chest x-ray - urgent care last year EKG - 10/12/20 Epic Stress Test - 04/12/19 Epic ECHO - 05/02/19 Epic Cardiac Cath - 2012 Pacemaker/ICD device last checked: n/a Spinal Cord Stimulator: n/a  Sleep Study - yes positive CPAP - yes every night   Fasting Blood Sugar - pre no check at home Checks Blood Sugar __ times a day  Blood Thinner Instructions: Aspirin Instructions: ASA 81, hold 7 days  Last Dose:  Activity level: Can go up a flight of stairs and perform activities of daily living without stopping and without symptoms of chest pain or shortness of breath.      Anesthesia review: CAD, OSA, HTN, DM, open heart surgery   Patient denies shortness of breath, fever, cough and chest pain at PAT appointment   Patient verbalized understanding of instructions that were given to them at the PAT appointment. Patient was also instructed that they will need to review over the PAT instructions again at home before surgery.

## 2021-02-27 NOTE — Patient Instructions (Addendum)
DUE TO COVID-19 ONLY ONE VISITOR IS ALLOWED TO COME WITH YOU AND STAY IN THE WAITING ROOM ONLY DURING PRE OP AND PROCEDURE.   **NO VISITORS ARE ALLOWED IN THE SHORT STAY AREA OR RECOVERY ROOM!!**  IF YOU WILL BE ADMITTED INTO THE HOSPITAL YOU ARE ALLOWED ONLY TWO SUPPORT PEOPLE DURING VISITATION HOURS ONLY (7 AM -8PM)   The support person(s) must pass our screening, gel in and out, and wear a mask at all times, including in the patients room. Patients must also wear a mask when staff or their support person are in the room. Visitors GUEST BADGE MUST BE WORN VISIBLY  One adult visitor may remain with you overnight and MUST be in the room by 8 P.M.  No visitors under the age of 32. Any visitor under the age of 67 must be accompanied by an adult.    COVID SWAB TESTING MUST BE COMPLETED ON:  03/08/21 @ 9:15 am   Site: [facility] [place] [place] Enter: Main Entrance have a seat in the waiting area to the right of main entrance (DO NOT [place]!!!!!) Dial: [phone] to alert staff you have arrived  You are not required to quarantine, however you are required to wear a well-fitted mask when you are out and around people not in your household.  Hand Hygiene often Do NOT share personal items Notify your provider if you are in close contact with someone who has COVID or you develop fever 100.4 or greater, new onset of sneezing, cough, sore throat, shortness of breath or body aches.   Your procedure is scheduled on: 03/12/21   Report to [facility] Main Entrance    Report to admitting at 6:40 AM   Call this number if you have problems the morning of surgery [phone]   Do not eat food :After Midnight.   May have liquids until 6:25 Am day of surgery  CLEAR LIQUID DIET  Foods Allowed                                                                     Foods Excluded  Water, Black Coffee and tea, regular and decaf                              liquids that you cannot  Plain Jell-O in any flavor  (No red)                                           see through such as: Fruit ices (not with fruit pulp)                                     milk, soups, orange juice              Iced Popsicles (No red)  All solid food                                   Apple juices Sports drinks like Gatorade (No red) Lightly seasoned clear broth or consume(fat free) Sugar    The day of surgery:  Drink ONE (1) Pre-Surgery G2 at 6:25 AM the morning of surgery. Drink in one sitting. Do not sip.  This drink was given to you during your hospital  pre-op appointment visit. Nothing else to drink after completing the  Pre-Surgery G2.          If you have questions, please contact your surgeons office.  FOLLOW BOWEL PREP INSTRUCTIONS YOU RECEIVED FROM YOUR SURGEON'S OFFICE!!!     Oral Hygiene is also important to reduce your risk of infection.                                    Remember - BRUSH YOUR TEETH THE MORNING OF SURGERY WITH YOUR REGULAR TOOTHPASTE   Follow instructions you were given regarding stopping vitamins, supplements, and Aspirin   Take these medicines the morning of surgery with A SIP OF WATER: Tylenol, Allopurinol, Zyrtec, Labetolol, Synthroid                              You may not have any metal on your body including hair pins, jewelry, and body piercing             Do not wear make-up, lotions, powders, perfumes/cologne, or deodorant  Do not wear nail polish including gel and S&S, artificial/acrylic nails, or any other type of covering on natural nails including finger and toenails. If you have artificial nails, gel coating, etc. that needs to be removed by a nail salon please have this removed prior to surgery or surgery may need to be canceled/ delayed if the surgeon/ anesthesia feels like they are unable to be safely monitored.   Do not shave  48 hours prior to surgery.               Men  may shave face and neck.   Do not bring valuables to the hospital. [place].   Bring CPAP mask and tubing day of surgery.   Bring small overnight bag day of surgery.    Patients discharged on the day of surgery will not be allowed to drive home.  Someone needs to stay with you for the first 24 hours after anesthesia.   Special Instructions: Bring a copy of your healthcare power of attorney and living will documents         the day of surgery if you haven't scanned them before.              Please read over the following fact sheets you were given: IF YOU HAVE QUESTIONS ABOUT YOUR PRE-OP INSTRUCTIONS PLEASE CALL [phone]     [facility] - Preparing for Surgery Before surgery, you can play an important role.  Because skin is not sterile, your skin needs to be as free of germs as possible.  You can reduce the number of germs on your skin by washing with CHG (chlorahexidine gluconate) soap before surgery.  CHG is an antiseptic cleaner which kills germs and bonds with the skin to continue killing germs even after washing. Please DO NOT use if you have an allergy to CHG or antibacterial soaps.  If your skin becomes reddened/irritated stop using the CHG and inform your nurse when you arrive at Short Stay. Do not shave (including legs and underarms) for at least 48 hours prior to the first CHG shower.  You may shave your face/neck.  Please follow these instructions carefully:  1.  Shower with CHG Soap the night before surgery and the  morning of surgery.  2.  If you choose to wash your hair, wash your hair first as usual with your normal  shampoo.  3.  After you shampoo, rinse your hair and body thoroughly to remove the shampoo.                             4.  Use CHG as you would any other liquid soap.  You can apply chg directly to the skin and wash.  Gently with a scrungie or clean washcloth.  5.  Apply the CHG Soap to your body ONLY FROM THE  NECK DOWN.   Do   not use on face/ open                           Wound or open sores. Avoid contact with eyes, ears mouth and   genitals (private parts).                       Wash face,  Genitals (private parts) with your normal soap.             6.  Wash thoroughly, paying special attention to the area where your    surgery  will be performed.  7.  Thoroughly rinse your body with warm water from the neck down.  8.  DO NOT shower/wash with your normal soap after using and rinsing off the CHG Soap.                9.  Pat yourself dry with a clean towel.            10.  Wear clean pajamas.            11.  Place clean sheets on your bed the night of your first shower and do not  sleep with pets. Day of Surgery : Do not apply any lotions/deodorants the morning of surgery.  Please wear clean clothes to the hospital/surgery center.  FAILURE TO FOLLOW THESE INSTRUCTIONS MAY RESULT IN THE CANCELLATION OF YOUR SURGERY  PATIENT SIGNATURE_________________________________  NURSE SIGNATURE__________________________________  ________________________________________________________________________   [name] [name]  An incentive spirometer is a tool that can help keep your lungs clear and active. This tool measures how well you are filling your lungs with each breath. Taking long deep breaths may help reverse or decrease the chance of developing breathing (pulmonary) problems (especially infection) following: A long period of time when you are unable to move or be active. BEFORE THE PROCEDURE  If the spirometer includes an indicator to show your best effort, your nurse or respiratory therapist will set it to a desired goal. If possible, sit up straight or lean slightly forward. Try not to slouch. Hold the incentive spirometer in an upright position. INSTRUCTIONS FOR USE  Sit on the edge of your  bed if possible, or sit up as far as you can in bed or on a chair. Hold the incentive spirometer in  an upright position. Breathe out normally. Place the mouthpiece in your mouth and seal your lips tightly around it. Breathe in slowly and as deeply as possible, raising the piston or the ball toward the top of the column. Hold your breath for 3-5 seconds or for as long as possible. Allow the piston or ball to fall to the bottom of the column. Remove the mouthpiece from your mouth and breathe out normally. Rest for a few seconds and repeat Steps 1 through 7 at least 10 times every 1-2 hours when you are awake. Take your time and take a few normal breaths between deep breaths. The spirometer may include an indicator to show your best effort. Use the indicator as a goal to work toward during each repetition. After each set of 10 deep breaths, practice coughing to be sure your lungs are clear. If you have an incision (the cut made at the time of surgery), support your incision when coughing by placing a pillow or rolled up towels firmly against it. Once you are able to get out of bed, walk around indoors and cough well. You may stop using the incentive spirometer when instructed by your caregiver.  RISKS AND COMPLICATIONS Take your time so you do not get dizzy or light-headed. If you are in pain, you may need to take or ask for pain medication before doing incentive spirometry. It is harder to take a deep breath if you are having pain. AFTER USE Rest and breathe slowly and easily. It can be helpful to keep track of a log of your progress. Your caregiver can provide you with a simple table to help with this. If you are using the spirometer at home, follow these instructions: [place] IF:  You are having difficultly using the spirometer. You have trouble using the spirometer as often as instructed. Your pain medication is not giving enough relief while using the spirometer. You develop fever of 100.5 F (38.1 C) or higher. SEEK IMMEDIATE MEDICAL CARE IF:  You cough up bloody sputum that  had not been present before. You develop fever of 102 F (38.9 C) or greater. You develop worsening pain at or near the incision site. MAKE SURE YOU:  Understand these instructions. Will watch your condition. Will get help right away if you are not doing well or get worse. Document Released: 05/27/2006 Document Revised: 04/08/2011 Document Reviewed: 07/28/2006 [facility] Patient Information 2014 [place], [place].   ________________________________________________________________________

## 2021-02-28 ENCOUNTER — Other Ambulatory Visit: Payer: Self-pay

## 2021-02-28 ENCOUNTER — Encounter (HOSPITAL_COMMUNITY)
Admission: RE | Admit: 2021-02-28 | Discharge: 2021-02-28 | Disposition: A | Discharge status: Home / Self Care | Payer: Medicare PPO | Source: Ambulatory Visit | Attending: Orthopedic Surgery | Admitting: Orthopedic Surgery

## 2021-02-28 ENCOUNTER — Encounter (HOSPITAL_COMMUNITY): Payer: Self-pay

## 2021-02-28 VITALS — BP 128/66 | HR 68 | Temp 98.0°F | Resp 18 | Ht 65.0 in | Wt 234.0 lb

## 2021-02-28 DIAGNOSIS — M1711 Unilateral primary osteoarthritis, right knee: Secondary | ICD-10-CM | POA: Diagnosis not present

## 2021-02-28 DIAGNOSIS — I2510 Atherosclerotic heart disease of native coronary artery without angina pectoris: Secondary | ICD-10-CM | POA: Diagnosis not present

## 2021-02-28 DIAGNOSIS — C9000 Multiple myeloma not having achieved remission: Secondary | ICD-10-CM | POA: Insufficient documentation

## 2021-02-28 DIAGNOSIS — Z951 Presence of aortocoronary bypass graft: Secondary | ICD-10-CM | POA: Insufficient documentation

## 2021-02-28 DIAGNOSIS — G4733 Obstructive sleep apnea (adult) (pediatric): Secondary | ICD-10-CM | POA: Diagnosis not present

## 2021-02-28 DIAGNOSIS — I10 Essential (primary) hypertension: Secondary | ICD-10-CM | POA: Insufficient documentation

## 2021-02-28 DIAGNOSIS — E119 Type 2 diabetes mellitus without complications: Secondary | ICD-10-CM | POA: Insufficient documentation

## 2021-02-28 DIAGNOSIS — Z01818 Encounter for other preprocedural examination: Secondary | ICD-10-CM

## 2021-02-28 DIAGNOSIS — Z01812 Encounter for preprocedural laboratory examination: Secondary | ICD-10-CM | POA: Diagnosis present

## 2021-02-28 LAB — CBC
HCT: 37.1 % — ABNORMAL LOW (ref 39.0–52.0)
Hemoglobin: 12.7 g/dL — ABNORMAL LOW (ref 13.0–17.0)
MCH: 33.8 pg (ref 26.0–34.0)
MCHC: 34.2 g/dL (ref 30.0–36.0)
MCV: 98.7 fL (ref 80.0–100.0)
Platelets: 144 10*3/uL — ABNORMAL LOW (ref 150–400)
RBC: 3.76 MIL/uL — ABNORMAL LOW (ref 4.22–5.81)
RDW: 14.9 % (ref 11.5–15.5)
WBC: 6 10*3/uL (ref 4.0–10.5)
nRBC: 0 % (ref 0.0–0.2)

## 2021-02-28 LAB — COMPREHENSIVE METABOLIC PANEL
ALT: 19 U/L (ref 0–44)
AST: 24 U/L (ref 15–41)
Albumin: 3.9 g/dL (ref 3.5–5.0)
Alkaline Phosphatase: 70 U/L (ref 38–126)
Anion gap: 5 (ref 5–15)
BUN: 27 mg/dL — ABNORMAL HIGH (ref 8–23)
CO2: 26 mmol/L (ref 22–32)
Calcium: 9.6 mg/dL (ref 8.9–10.3)
Chloride: 105 mmol/L (ref 98–111)
Creatinine, Ser: 1.06 mg/dL (ref 0.61–1.24)
GFR, Estimated: >60 mL/min (ref >60)
Glucose, Bld: 113 mg/dL — ABNORMAL HIGH (ref 70–99)
Potassium: 4.8 mmol/L (ref 3.5–5.1)
Sodium: 136 mmol/L (ref 135–145)
Total Bilirubin: 1 mg/dL (ref 0.3–1.2)
Total Protein: 7.3 g/dL (ref 6.5–8.1)

## 2021-02-28 LAB — PROTIME-INR
INR: 1 (ref 0.8–1.2)
Prothrombin Time: 13 seconds (ref 11.4–15.2)

## 2021-02-28 LAB — GLUCOSE, CAPILLARY: Glucose-Capillary: 121 mg/dL — ABNORMAL HIGH (ref 70–99)

## 2021-02-28 LAB — SURGICAL PCR SCREEN
MRSA, PCR: NEGATIVE
Staphylococcus aureus: POSITIVE — AB

## 2021-03-01 LAB — HEMOGLOBIN A1C
Hgb A1c MFr Bld: 6.3 % — ABNORMAL HIGH (ref 4.8–5.6)
Mean Plasma Glucose: 134 mg/dL

## 2021-03-02 NOTE — Progress Notes (Signed)
Anesthesia Chart Review:   Case: [id number] Date/Time: 03/12/21 0910   Procedure: TOTAL KNEE ARTHROPLASTY (Right: Knee)   Anesthesia type: Choice   Pre-op diagnosis: right knee osteoarthritis   Location: [facility] ROOM 09 / [facility]   Surgeons: Gaynelle Arabian, MD       DISCUSSION: Pt is 88 years old with hx CAD (s/p CABG x5 2012), HTN, DM, OSA, multiple myeloma  VS: BP 128/66    Pulse 68    Temp 36.7 C (Oral)    Resp 18    Ht $R'5\' 5"'Oi$  (1.651 m)    Wt 106.1 kg    SpO2 97%    BMI 38.94 kg/m   PROVIDERS: - PCP is Tamsen Roers, MD - Cardiologist is Adrian Prows, MD, who cleared pt for surgery. Last office visit 10/12/20  LABS: Labs reviewed: Acceptable for surgery. (all labs ordered are listed, but only abnormal results are displayed)  Labs Reviewed  SURGICAL PCR SCREEN - Abnormal; Notable for the following components:      Result Value   Staphylococcus aureus POSITIVE (*)    All other components within normal limits  CBC - Abnormal; Notable for the following components:   RBC 3.76 (*)    Hemoglobin 12.7 (*)    HCT 37.1 (*)    Platelets 144 (*)    All other components within normal limits  COMPREHENSIVE METABOLIC PANEL - Abnormal; Notable for the following components:   Glucose, Bld 113 (*)    BUN 27 (*)    All other components within normal limits  HEMOGLOBIN A1C - Abnormal; Notable for the following components:   Hgb A1c MFr Bld 6.3 (*)    All other components within normal limits  GLUCOSE, CAPILLARY - Abnormal; Notable for the following components:   Glucose-Capillary 121 (*)    All other components within normal limits  PROTIME-INR    EKG 10/12/20: Normal sinus rhythm at the rate of 72 bpm, normal axis, incomplete right branch block.  Poor IV progression, probably normal.  No evidence of ischemia.  No significant change from 04/06/2020.   CV: Nuclear stress test 04/12/19:  - Nondiagnostic ECG stress. Resting EKG demonstrated normal sinus rhythm, high lateral infarct, old. No ST-T  wave abnormalities.   - Left ventricle is normal in is mildly enlarged in both rest and stress images.  There is moderate diaphragmatic attenuation artifact in the inferior wall.  A very small sized mid inferolateral nontransmural scar cannot be completely excluded. - Dynamic gated images reveal normal wall motion, stress - LVEF was 60%. - Low risk study.  Previous study report from 04/15/2011 noted to have normal perfusion.  Echo 04/07/19:  - Normal LV systolic function with visual EF 55-60%. Left ventricle cavity is normal in size. Moderate left ventricular hypertrophy. Normal global wall motion. Indeterminate diastolic filling pattern, normal LAP. No obvious regional wall motion abnormalities.  - Left atrial cavity is moderately dilated.  - Mild mitral regurgitation.  - Mild tricuspid regurgitation.  - Mild pulmonic regurgitation.  - No prior study for comparison   Past Medical History:  Diagnosis Date   Complication of anesthesia    Pt difficult to intubate; 07/19/03 aborted surgery unable to intubate   Coronary artery disease    Diabetes mellitus without complication ([place])    Dysthymic disorder    Gout    Hx: of in left wrist   Gout    Hearing deficit    Hx: of wears hearing aids   Hypertension  Kidney stones    Hx: of   Multiple myeloma (HCC)    Occlusion and stenosis of unspecified carotid artery    Pathologic fracture of vertebrae    Pneumonia    Rosacea    Sleep apnea    Hx: of pt wears CPAP    Past Surgical History:  Procedure Laterality Date   APPENDECTOMY     BACK SURGERY  [phone]   lumb   CARDIAC CATHETERIZATION     Hx: of 04/05/10   CAROTID ARTERY - SUBCLAVIAN ARTERY BYPASS GRAFT  2004   Hx: of-left   CARPAL TUNNEL RELEASE Right 04/13/2013   Procedure: RIGHT CARPAL TUNNEL RELEASE;  Surgeon: Cammie Sickle., MD;  Location: [place];  Service: Orthopedics;  Laterality: Right;   CATARACT EXTRACTION     Hx: of left eye only   COLONOSCOPY      COLONOSCOPY W/ BIOPSIES AND POLYPECTOMY     Hx: of   CORONARY ARTERY BYPASS GRAFT  2012   Hx: of [place]   Hx: of-lt   HERNIA REPAIR  1963   Hx: of right   JOINT REPLACEMENT  2006   Hx: of left knee   LUMBAR LAMINECTOMY/DECOMPRESSION MICRODISCECTOMY N/A 11/18/2012   Procedure: REVISION L2 - L4 DECOMPRESSION DISCECTOMY 2 LEVELS;  Surgeon: Melina Schools, MD;  Location: [place];  Service: Orthopedics;  Laterality: N/A;   ROTATOR CUFF REPAIR  2003   Hx: of right   TONSILLECTOMY     TRIGGER FINGER RELEASE Right 04/13/2013   Procedure: RIGHT THUMB A-1 PULLEY RELEASE;  Surgeon: Cammie Sickle., MD;  Location: [place];  Service: Orthopedics;  Laterality: Right;    MEDICATIONS:  acetaminophen (TYLENOL) 650 MG CR tablet   allopurinol (ZYLOPRIM) 300 MG tablet   aspirin EC 81 MG tablet   cetirizine (ZYRTEC) 10 MG tablet   cromolyn (OPTICROM) 4 % ophthalmic solution   docusate sodium (COLACE) 100 MG capsule   fluticasone (FLONASE) 50 MCG/ACT nasal spray   gabapentin (NEURONTIN) 100 MG capsule   hydrochlorothiazide (HYDRODIURIL) 25 MG tablet   isosorbide dinitrate (ISORDIL) 30 MG tablet   labetalol (NORMODYNE) 200 MG tablet   levothyroxine (SYNTHROID, LEVOTHROID) 100 MCG tablet   losartan (COZAAR) 100 MG tablet   Multiple Vitamin (MULTIVITAMIN) tablet   Multiple Vitamins-Minerals (PRESERVISION AREDS PO)   nitroGLYCERIN (NITROSTAT) 0.4 MG SL tablet   Polyethyl Glycol-Propyl Glycol (SYSTANE OP)   rosuvastatin (CRESTOR) 20 MG tablet   spironolactone (ALDACTONE) 25 MG tablet   Vitamin D, Ergocalciferol, (DRISDOL) 50000 UNITS CAPS    testosterone cypionate (DEPOTESTOSTERONE CYPIONATE) injection 200 mg   If no changes, I anticipate pt can proceed with surgery as scheduled.   Willeen Cass, PhD, FNP-BC [facility]/Anesthesiology Phone: [phone] 03/02/2021 8:52 AM

## 2021-03-08 ENCOUNTER — Encounter (HOSPITAL_COMMUNITY)
Admission: RE | Admit: 2021-03-08 | Discharge: 2021-03-08 | Disposition: A | Discharge status: Home / Self Care | Payer: Medicare PPO | Source: Ambulatory Visit | Attending: Orthopedic Surgery | Admitting: Orthopedic Surgery

## 2021-03-08 ENCOUNTER — Other Ambulatory Visit: Payer: Self-pay

## 2021-03-08 DIAGNOSIS — Z20822 Contact with and (suspected) exposure to covid-19: Secondary | ICD-10-CM | POA: Insufficient documentation

## 2021-03-08 DIAGNOSIS — Z01812 Encounter for preprocedural laboratory examination: Secondary | ICD-10-CM | POA: Diagnosis present

## 2021-03-08 DIAGNOSIS — Z01818 Encounter for other preprocedural examination: Secondary | ICD-10-CM

## 2021-03-08 LAB — SARS CORONAVIRUS 2 (TAT 6-24 HRS): SARS Coronavirus 2: NEGATIVE

## 2021-03-11 NOTE — H&P (Signed)
TOTAL KNEE ADMISSION H&P  Patient is being admitted for right total knee arthroplasty.  Subjective:  Chief Complaint: Right knee pain.  HPI: [name] [name], 88 y.o. male has a history of pain and functional disability in the right knee due to arthritis and has failed non-surgical conservative treatments for greater than 12 weeks to include NSAID's and/or analgesics and activity modification. Onset of symptoms was gradual, starting  several  years ago with gradually worsening course since that time. The patient noted no past surgery on the right knee.  Patient currently rates pain in the right knee at 7 out of 10 with activity. Patient has worsening of pain with activity and weight bearing, pain that interferes with activities of daily living, pain with passive range of motion, and crepitus. Patient has evidence of periarticular osteophytes and joint space narrowing by imaging studies. There is no active infection.  Patient Active Problem List   Diagnosis Date Noted   CAD native coronary artery 3 Vessel CAD; RCA 99% mid; Mid Cx; 80%, Ostial LAD 80%, Mid 90% S/P CABG  x5  04/09/10  LIMA to the LAD, SVG to D1, SVG to OM1 & OM3, SVG to Rt PDA. 05/27/2018   Osteoporosis 09/30/2017   Morbid obesity ([place]) 09/08/2017   Memory loss 05/29/2016   OSA on CPAP 05/29/2016   Tenosynovitis of foot and ankle 10/13/2013   Pain in lower limb 06/09/2013   Hammer toe 07/13/2012   Onychomycosis 07/13/2012   Pain in joint, ankle and foot 07/13/2012   MGUS (monoclonal gammopathy of unknown significance) 06/17/2011    Past Medical History:  Diagnosis Date   Complication of anesthesia    Pt difficult to intubate; 07/19/03 aborted surgery unable to intubate   Coronary artery disease    Diabetes mellitus without complication ([place])    Dysthymic disorder    Gout    Hx: of in left wrist   Gout    Hearing deficit    Hx: of wears hearing aids   Hypertension    Kidney stones    Hx: of   Multiple myeloma ([place])     Occlusion and stenosis of unspecified carotid artery    Pathologic fracture of vertebrae    Pneumonia    Rosacea    Sleep apnea    Hx: of pt wears CPAP    Past Surgical History:  Procedure Laterality Date   APPENDECTOMY     BACK SURGERY  [phone]   lumb   CARDIAC CATHETERIZATION     Hx: of 04/05/10   CAROTID ARTERY - SUBCLAVIAN ARTERY BYPASS GRAFT  2004   Hx: of-left   CARPAL TUNNEL RELEASE Right 04/13/2013   Procedure: RIGHT CARPAL TUNNEL RELEASE;  Surgeon: Cammie Sickle., MD;  Location: [facility];  Service: Orthopedics;  Laterality: Right;   CATARACT EXTRACTION     Hx: of left eye only   COLONOSCOPY     COLONOSCOPY W/ BIOPSIES AND POLYPECTOMY     Hx: of   CORONARY ARTERY BYPASS GRAFT  2012   Hx: of [place]   Hx: of-lt   HERNIA REPAIR  1963   Hx: of right   JOINT REPLACEMENT  2006   Hx: of left knee   LUMBAR LAMINECTOMY/DECOMPRESSION MICRODISCECTOMY N/A 11/18/2012   Procedure: REVISION L2 - L4 DECOMPRESSION DISCECTOMY 2 LEVELS;  Surgeon: Melina Schools, MD;  Location: [facility];  Service: Orthopedics;  Laterality: N/A;   ROTATOR CUFF REPAIR  2003  Hx: of right  ° TONSILLECTOMY    ° TRIGGER FINGER RELEASE Right 04/13/2013  ° Procedure: RIGHT THUMB A-1 PULLEY RELEASE;  Surgeon: Robert V Sypher Jr., MD;  Location: [facility];  Service: Orthopedics;  Laterality: Right;  ° ° °Prior to Admission medications   °Medication Sig Start Date End Date Taking? Authorizing Provider  °acetaminophen (TYLENOL) 650 MG CR tablet Take 650 mg by mouth every 8 (eight) hours as needed for pain.   Yes [provider]  °allopurinol (ZYLOPRIM) 300 MG tablet Take 300 mg by mouth daily. 04/27/11  Yes [provider]  °aspirin EC 81 MG tablet Take 81 mg by mouth at bedtime.   Yes [provider]  °cetirizine (ZYRTEC) 10 MG tablet Take 10 mg by mouth daily as needed for allergies.   Yes [provider]  °cromolyn (OPTICROM) 4 %  ophthalmic solution Place 1 drop into both eyes in the morning and at bedtime. 04/22/16  Yes [provider]  °docusate sodium (COLACE) 100 MG capsule Take 100 mg by mouth daily as needed for moderate constipation.   Yes [provider]  °fluticasone (FLONASE) 50 MCG/ACT nasal spray Place 1 spray into both nostrils daily as needed for allergies. 11/25/18  Yes [provider]  °gabapentin (NEURONTIN) 100 MG capsule Take 100 mg by mouth at bedtime. 07/30/18  Yes [provider]  °hydrochlorothiazide (HYDRODIURIL) 25 MG tablet TAKE 1 TABLET BY MOUTH EVERY DAY 01/09/21  Yes Ganji, Jay, MD  °isosorbide dinitrate (ISORDIL) 30 MG tablet TAKE 1 TABLET BY MOUTH THREE TIMES A DAY 02/07/21  Yes Ganji, Jay, MD  °labetalol (NORMODYNE) 200 MG tablet TAKE 1 TABLET BY MOUTH TWICE A DAY 01/09/21  Yes Ganji, Jay, MD  °levothyroxine (SYNTHROID, LEVOTHROID) 100 MCG tablet Take 100 mcg by mouth daily before breakfast.   Yes [provider]  °losartan (COZAAR) 100 MG tablet TAKE 1 TABLET BY MOUTH EVERY DAY °Patient taking differently: Take 100 mg by mouth at bedtime. 10/19/20  Yes Ganji, Jay, MD  °Multiple Vitamin (MULTIVITAMIN) tablet Take 1 tablet by mouth daily.   Yes [provider]  °Multiple Vitamins-Minerals (PRESERVISION AREDS PO) Take 1 capsule by mouth in the morning and at bedtime.   Yes [provider]  °nitroGLYCERIN (NITROSTAT) 0.4 MG SL tablet Place 1 tablet (0.4 mg total) under the tongue every 5 (five) minutes as needed for chest pain. If your symptoms continue after taking two tablets 5 minutes apart call 911. 03/30/19  Yes Tolia, Sunit, DO  °Polyethyl Glycol-Propyl Glycol (SYSTANE OP) Place 1 drop into both eyes daily as needed (dry eyes).   Yes [provider]  °rosuvastatin (CRESTOR) 20 MG tablet Take 20 mg by mouth at bedtime.   Yes [provider]  °spironolactone (ALDACTONE) 25 MG tablet TAKE 1 TABLET (25 MG TOTAL) BY MOUTH IN THE MORNING.  11/21/20  Yes Ganji, Jay, MD  °Vitamin D, Ergocalciferol, (DRISDOL) 50000 UNITS CAPS Take 50,000 Units by mouth every Saturday. 06/05/11  Yes [provider]  ° ° °Allergies  °Allergen Reactions  ° Amlodipine Swelling  ° Ciprofloxacin Nausea Only  ° Hydralazine Other (See Comments)  °  Leg edema  ° Hydrocodone Nausea Only  °  Ok taking in low doses(5mg), has nausea with higher doses  ° Levofloxacin Other (See Comments)  °  Pain/cramping °  ° Oxycodone Nausea Only  ° Tramadol Nausea Only  ° Cardizem [Diltiazem Hcl] Rash  ° ° °Social History  ° °Socioeconomic   History   Marital status: Married    Spouse name: Not on file   Number of children: 1   Years of education: college   Highest education level: Not on file  Occupational History   Occupation: Retired   Tobacco Use   Smoking status: Former    Packs/day: 0.50    Years: 30.00    Pack years: 15.00    Types: Cigarettes    Quit date: 04/08/1978    Years since quitting: 42.9   Smokeless tobacco: Never   Tobacco comments:    Quit smoking cigarretes in 1980  Vaping Use   Vaping Use: Never used  Substance and Sexual Activity   Alcohol use: No    Alcohol/week: 0.0 standard drinks   Drug use: No   Sexual activity: Not on file  Other Topics Concern   Not on file  Social History Narrative   Rare caffeine use    Social Determinants of Health   Financial Resource Strain: Not on file  Food Insecurity: Not on file  Transportation Needs: Not on file  Physical Activity: Not on file  Stress: Not on file  Social Connections: Not on file  Intimate Partner Violence: Not on file    Tobacco Use: Medium Risk   Smoking Tobacco Use: Former   Smokeless Tobacco Use: Never   Passive Exposure: Not on file   Social History   Substance and Sexual Activity  Alcohol Use No   Alcohol/week: 0.0 standard drinks    Family History  Problem Relation Age of Onset   Cancer - Colon Mother    Heart disease Father    Alzheimer's disease Sister     Stroke Sister        6 or 7   Alzheimer's disease Brother    Alzheimer's disease Brother    Dementia Other     ROS: Constitutional: no fever, no chills, no night sweats, no significant weight loss Cardiovascular: no chest pain, no palpitations Respiratory: no cough, no shortness of breath, No COPD Gastrointestinal: no vomiting, no nausea Musculoskeletal: no swelling in Joints, Joint Pain Neurologic: no numbness, no tingling, no difficulty with balance   Objective:  Physical Exam: Well nourished and well developed.  General: Alert and oriented x3, cooperative and pleasant, no acute distress.  Head: normocephalic, atraumatic, neck supple.  Eyes: EOMI.  Respiratory: breath sounds clear in all fields, no wheezing, rales, or rhonchi. Cardiovascular: Regular rate and rhythm, no murmurs, gallops or rubs.  Abdomen: non-tender to palpation and soft, normoactive bowel sounds. Musculoskeletal:  Right Knee Exam:   Moderate effusion present.   The range of motion is: 0 to 120 degrees.   No crepitus on range of motion of the knee.   Positive medial joint line tenderness.   No lateral joint line tenderness.   The knee is stable.     Left Knee Exam:   No effusion present. No swelling present.   The Range of motion is: 0 to 125 degrees.   No crepitus on range of motion of the knee.   No medial joint line tenderness.   No lateral joint line tenderness.   Calves soft and nontender. Motor function intact in LE. Strength 5/5 LE bilaterally. Neuro: Distal pulses 2+. Sensation to light touch intact in LE.    Vital signs in last 24 hours:    Imaging Review Radiographs- AP and lateral of the bilateral knees dated 10/2019 demonstrate bone-on-bone arthritis in the medial and patellofemoral compartments of the right knee, near  bone-on-bone in the lateral compartment. His left total knee arthroplasty is in excellent position with no periprosthetic abnormalities.  Assessment/Plan:  End  stage arthritis, right knee   The patient history, physical examination, clinical judgment of the provider and imaging studies are consistent with end stage degenerative joint disease of the right knee and total knee arthroplasty is deemed medically necessary. The treatment options including medical management, injection therapy arthroscopy and arthroplasty were discussed at length. The risks and benefits of total knee arthroplasty were presented and reviewed. The risks due to aseptic loosening, infection, stiffness, patella tracking problems, thromboembolic complications and other imponderables were discussed. The patient acknowledged the explanation, agreed to proceed with the plan and consent was signed. Patient is being admitted for inpatient treatment for surgery, pain control, PT, OT, prophylactic antibiotics, VTE prophylaxis, progressive ambulation and ADLs and discharge planning. The patient is planning to be discharged  home .   Patient's anticipated LOS is less than 2 midnights, meeting these requirements: - Younger than 26 - Lives within 1 hour of care - Has a competent adult at home to recover with post-op recover - NO history of  - Chronic pain requiring opiods  - Diabetes  - Coronary Artery Disease  - Heart failure  - Heart attack  - Stroke  - DVT/VTE  - Cardiac arrhythmia  - Respiratory Failure/COPD  - Renal failure  - Anemia  - Advanced Liver disease    Therapy Plans: [facility] Disposition: Home with Wife Planned DVT Prophylaxis: Aspirin 377m  DME Needed: None PCP: [facility] (clearance received) Cardiologist: JKela Millin MD (clearance received) TXA: IV Allergies: Cipro, Cardizem, Hydrocodone, Oxycodone, Tramadol Anesthesia Concerns: CPAP BMI: 39.6 Last HgbA1c: n/a   Ph[facility] [address] - Patient was instructed on what medications to stop prior to surgery. - Follow-up visit in 2 weeks with Dr. AWynelle Link- Begin physical  therapy following surgery - Pre-operative lab work as pre-surgical testing - Prescriptions will be provided in hospital at time of discharge  SFenton Foy [facility] PA-C Orthopedic Surgery [facility] [place]

## 2021-03-12 ENCOUNTER — Encounter (HOSPITAL_COMMUNITY): Payer: Self-pay | Admitting: Orthopedic Surgery

## 2021-03-12 ENCOUNTER — Observation Stay (HOSPITAL_COMMUNITY)
Admission: RE | Admit: 2021-03-12 | Discharge: 2021-03-13 | Disposition: A | Discharge status: Home / Self Care | Payer: Medicare PPO | Source: Ambulatory Visit | Attending: Orthopedic Surgery | Admitting: Orthopedic Surgery

## 2021-03-12 ENCOUNTER — Other Ambulatory Visit: Payer: Self-pay

## 2021-03-12 ENCOUNTER — Observation Stay (HOSPITAL_COMMUNITY): Payer: Medicare PPO | Admitting: Emergency Medicine

## 2021-03-12 ENCOUNTER — Observation Stay (HOSPITAL_BASED_OUTPATIENT_CLINIC_OR_DEPARTMENT_OTHER): Payer: Medicare PPO | Admitting: Certified Registered"

## 2021-03-12 ENCOUNTER — Encounter (HOSPITAL_COMMUNITY)
Admission: RE | Disposition: A | Discharge status: Home / Self Care | Payer: Self-pay | Source: Ambulatory Visit | Attending: Orthopedic Surgery

## 2021-03-12 DIAGNOSIS — M1711 Unilateral primary osteoarthritis, right knee: Principal | ICD-10-CM | POA: Insufficient documentation

## 2021-03-12 DIAGNOSIS — Z96652 Presence of left artificial knee joint: Secondary | ICD-10-CM | POA: Diagnosis not present

## 2021-03-12 DIAGNOSIS — I2510 Atherosclerotic heart disease of native coronary artery without angina pectoris: Secondary | ICD-10-CM | POA: Insufficient documentation

## 2021-03-12 DIAGNOSIS — Z7982 Long term (current) use of aspirin: Secondary | ICD-10-CM | POA: Diagnosis not present

## 2021-03-12 DIAGNOSIS — Z79899 Other long term (current) drug therapy: Secondary | ICD-10-CM | POA: Diagnosis not present

## 2021-03-12 DIAGNOSIS — I10 Essential (primary) hypertension: Secondary | ICD-10-CM | POA: Insufficient documentation

## 2021-03-12 DIAGNOSIS — Z8579 Personal history of other malignant neoplasms of lymphoid, hematopoietic and related tissues: Secondary | ICD-10-CM | POA: Insufficient documentation

## 2021-03-12 DIAGNOSIS — G4733 Obstructive sleep apnea (adult) (pediatric): Secondary | ICD-10-CM

## 2021-03-12 DIAGNOSIS — M179 Osteoarthritis of knee, unspecified: Secondary | ICD-10-CM | POA: Diagnosis present

## 2021-03-12 DIAGNOSIS — Z96651 Presence of right artificial knee joint: Secondary | ICD-10-CM

## 2021-03-12 DIAGNOSIS — E119 Type 2 diabetes mellitus without complications: Secondary | ICD-10-CM | POA: Insufficient documentation

## 2021-03-12 DIAGNOSIS — Z87891 Personal history of nicotine dependence: Secondary | ICD-10-CM | POA: Insufficient documentation

## 2021-03-12 HISTORY — PX: TOTAL KNEE ARTHROPLASTY: SHX125

## 2021-03-12 LAB — GLUCOSE, CAPILLARY
Glucose-Capillary: 114 mg/dL — ABNORMAL HIGH (ref 70–99)
Glucose-Capillary: 118 mg/dL — ABNORMAL HIGH (ref 70–99)

## 2021-03-12 SURGERY — ARTHROPLASTY, KNEE, TOTAL
Anesthesia: Spinal | Site: Knee | Laterality: Right

## 2021-03-12 MED ORDER — DEXAMETHASONE SODIUM PHOSPHATE 10 MG/ML IJ SOLN
INTRAMUSCULAR | Status: AC
  Filled 2021-03-12: qty 1

## 2021-03-12 MED ORDER — HYDROMORPHONE HCL 1 MG/ML IJ SOLN
0.5000 mg | INTRAMUSCULAR | Status: DC | PRN
  Administered 2021-03-12: 0.5 mg via INTRAVENOUS
  Administered 2021-03-13: 1 mg via INTRAVENOUS
  Filled 2021-03-12 (×2): qty 1

## 2021-03-12 MED ORDER — SODIUM CHLORIDE 0.9 % IV SOLN: INTRAVENOUS | Status: DC

## 2021-03-12 MED ORDER — LOSARTAN POTASSIUM 50 MG PO TABS
100.0000 mg | ORAL_TABLET | Freq: Every day | ORAL | Status: DC
  Administered 2021-03-13: 100 mg via ORAL
  Filled 2021-03-12: qty 2

## 2021-03-12 MED ORDER — FENTANYL CITRATE PF 50 MCG/ML IJ SOSY: 25.0000 ug | PREFILLED_SYRINGE | INTRAMUSCULAR | Status: DC | PRN

## 2021-03-12 MED ORDER — LACTATED RINGERS IV SOLN: INTRAVENOUS | Status: DC

## 2021-03-12 MED ORDER — METOCLOPRAMIDE HCL 5 MG PO TABS: 5.0000 mg | ORAL_TABLET | Freq: Three times a day (TID) | ORAL | Status: DC | PRN

## 2021-03-12 MED ORDER — ROPIVACAINE HCL 5 MG/ML IJ SOLN
INTRAMUSCULAR | Status: DC | PRN
  Administered 2021-03-12: 30 mL via PERINEURAL

## 2021-03-12 MED ORDER — PROPOFOL 10 MG/ML IV BOLUS
INTRAVENOUS | Status: DC | PRN
Start: 2021-03-12 — End: 2021-03-12
  Administered 2021-03-12: 10 mg via INTRAVENOUS

## 2021-03-12 MED ORDER — TRANEXAMIC ACID-NACL 1000-0.7 MG/100ML-% IV SOLN
1000.0000 mg | INTRAVENOUS | Status: AC
  Administered 2021-03-12: 1000 mg via INTRAVENOUS
  Filled 2021-03-12: qty 100

## 2021-03-12 MED ORDER — METOCLOPRAMIDE HCL 5 MG/ML IJ SOLN: 5.0000 mg | Freq: Three times a day (TID) | INTRAMUSCULAR | Status: DC | PRN

## 2021-03-12 MED ORDER — EPHEDRINE 5 MG/ML INJ
INTRAVENOUS | Status: AC
  Filled 2021-03-12: qty 5

## 2021-03-12 MED ORDER — ACETAMINOPHEN 10 MG/ML IV SOLN
1000.0000 mg | Freq: Once | INTRAVENOUS | Status: DC
  Filled 2021-03-12: qty 100

## 2021-03-12 MED ORDER — LABETALOL HCL 100 MG PO TABS
200.0000 mg | ORAL_TABLET | Freq: Two times a day (BID) | ORAL | Status: DC
  Administered 2021-03-12 – 2021-03-13 (×2): 200 mg via ORAL
  Filled 2021-03-12 (×2): qty 2

## 2021-03-12 MED ORDER — LORATADINE 10 MG PO TABS
10.0000 mg | ORAL_TABLET | Freq: Every day | ORAL | Status: DC
  Administered 2021-03-13: 10 mg via ORAL
  Filled 2021-03-12 (×2): qty 1

## 2021-03-12 MED ORDER — DOCUSATE SODIUM 100 MG PO CAPS
100.0000 mg | ORAL_CAPSULE | Freq: Two times a day (BID) | ORAL | Status: DC
  Administered 2021-03-12 – 2021-03-13 (×2): 100 mg via ORAL
  Filled 2021-03-12 (×2): qty 1

## 2021-03-12 MED ORDER — ISOSORBIDE DINITRATE 20 MG PO TABS
30.0000 mg | ORAL_TABLET | Freq: Three times a day (TID) | ORAL | Status: DC
  Administered 2021-03-12 – 2021-03-13 (×3): 30 mg via ORAL
  Filled 2021-03-12 (×4): qty 1

## 2021-03-12 MED ORDER — POVIDONE-IODINE 10 % EX SWAB
2.0000 "application " | Freq: Once | CUTANEOUS | Status: AC
  Administered 2021-03-12: 2 via TOPICAL

## 2021-03-12 MED ORDER — CEFAZOLIN SODIUM-DEXTROSE 2-4 GM/100ML-% IV SOLN
2.0000 g | INTRAVENOUS | Status: AC
  Administered 2021-03-12: 2 g via INTRAVENOUS
  Filled 2021-03-12: qty 100

## 2021-03-12 MED ORDER — METHOCARBAMOL 500 MG IVPB - SIMPLE MED
500.0000 mg | Freq: Four times a day (QID) | INTRAVENOUS | Status: DC | PRN
  Filled 2021-03-12: qty 50

## 2021-03-12 MED ORDER — PHENYLEPHRINE 40 MCG/ML (10ML) SYRINGE FOR IV PUSH (FOR BLOOD PRESSURE SUPPORT)
PREFILLED_SYRINGE | INTRAVENOUS | Status: DC | PRN
  Administered 2021-03-12 (×3): 120 ug via INTRAVENOUS

## 2021-03-12 MED ORDER — ONDANSETRON HCL 4 MG PO TABS: 4.0000 mg | ORAL_TABLET | Freq: Four times a day (QID) | ORAL | Status: DC | PRN

## 2021-03-12 MED ORDER — METHOCARBAMOL 500 MG PO TABS
500.0000 mg | ORAL_TABLET | Freq: Four times a day (QID) | ORAL | Status: DC | PRN
  Administered 2021-03-12 – 2021-03-13 (×3): 500 mg via ORAL
  Filled 2021-03-12 (×3): qty 1

## 2021-03-12 MED ORDER — BISACODYL 10 MG RE SUPP: 10.0000 mg | Freq: Every day | RECTAL | Status: DC | PRN

## 2021-03-12 MED ORDER — ALLOPURINOL 300 MG PO TABS
300.0000 mg | ORAL_TABLET | Freq: Every day | ORAL | Status: DC
Start: 2021-03-12 — End: 2021-03-13
  Administered 2021-03-12 – 2021-03-13 (×2): 300 mg via ORAL
  Filled 2021-03-12 (×2): qty 1

## 2021-03-12 MED ORDER — BUPIVACAINE LIPOSOME 1.3 % IJ SUSP
20.0000 mL | Freq: Once | INTRAMUSCULAR | Status: DC
Start: 2021-03-12 — End: 2021-03-12

## 2021-03-12 MED ORDER — FLEET ENEMA 7-19 GM/118ML RE ENEM: 1.0000 | ENEMA | Freq: Once | RECTAL | Status: DC | PRN

## 2021-03-12 MED ORDER — PHENOL 1.4 % MT LIQD: 1.0000 | OROMUCOSAL | Status: DC | PRN

## 2021-03-12 MED ORDER — BUPIVACAINE LIPOSOME 1.3 % IJ SUSP
INTRAMUSCULAR | Status: DC | PRN
  Administered 2021-03-12: 20 mL

## 2021-03-12 MED ORDER — GABAPENTIN 300 MG PO CAPS
300.0000 mg | ORAL_CAPSULE | Freq: Three times a day (TID) | ORAL | Status: DC
  Administered 2021-03-12 – 2021-03-13 (×3): 300 mg via ORAL
  Filled 2021-03-12 (×3): qty 1

## 2021-03-12 MED ORDER — PROPOFOL 10 MG/ML IV BOLUS
INTRAVENOUS | Status: AC
  Filled 2021-03-12: qty 20

## 2021-03-12 MED ORDER — ONDANSETRON HCL 4 MG/2ML IJ SOLN: 4.0000 mg | Freq: Once | INTRAMUSCULAR | Status: DC | PRN

## 2021-03-12 MED ORDER — SODIUM CHLORIDE 0.9 % IR SOLN
Status: DC | PRN
  Administered 2021-03-12 (×2): 1000 mL

## 2021-03-12 MED ORDER — DEXAMETHASONE SODIUM PHOSPHATE 10 MG/ML IJ SOLN
INTRAMUSCULAR | Status: DC | PRN
  Administered 2021-03-12: 10 mg

## 2021-03-12 MED ORDER — ONDANSETRON HCL 4 MG/2ML IJ SOLN: 4.0000 mg | Freq: Four times a day (QID) | INTRAMUSCULAR | Status: DC | PRN

## 2021-03-12 MED ORDER — SODIUM CHLORIDE (PF) 0.9 % IJ SOLN
INTRAMUSCULAR | Status: DC | PRN
  Administered 2021-03-12: 60 mL via INTRAVENOUS

## 2021-03-12 MED ORDER — FENTANYL CITRATE PF 50 MCG/ML IJ SOSY
50.0000 ug | PREFILLED_SYRINGE | Freq: Once | INTRAMUSCULAR | Status: AC
  Administered 2021-03-12: 50 ug via INTRAVENOUS
  Filled 2021-03-12: qty 2

## 2021-03-12 MED ORDER — ONDANSETRON HCL 4 MG/2ML IJ SOLN
INTRAMUSCULAR | Status: DC | PRN
  Administered 2021-03-12: 4 mg via INTRAVENOUS

## 2021-03-12 MED ORDER — ORAL CARE MOUTH RINSE: 15.0000 mL | Freq: Once | OROMUCOSAL | Status: AC

## 2021-03-12 MED ORDER — CHLORHEXIDINE GLUCONATE 0.12 % MT SOLN
15.0000 mL | Freq: Once | OROMUCOSAL | Status: AC
  Administered 2021-03-12: 15 mL via OROMUCOSAL

## 2021-03-12 MED ORDER — HYDROCHLOROTHIAZIDE 25 MG PO TABS
25.0000 mg | ORAL_TABLET | Freq: Every day | ORAL | Status: DC
  Administered 2021-03-13: 25 mg via ORAL
  Filled 2021-03-12: qty 1

## 2021-03-12 MED ORDER — CEFAZOLIN SODIUM-DEXTROSE 2-4 GM/100ML-% IV SOLN
2.0000 g | Freq: Four times a day (QID) | INTRAVENOUS | Status: AC
  Administered 2021-03-12 (×2): 2 g via INTRAVENOUS
  Filled 2021-03-12 (×2): qty 100

## 2021-03-12 MED ORDER — DIPHENHYDRAMINE HCL 12.5 MG/5ML PO ELIX: 12.5000 mg | ORAL_SOLUTION | ORAL | Status: DC | PRN

## 2021-03-12 MED ORDER — ACETAMINOPHEN 325 MG PO TABS
325.0000 mg | ORAL_TABLET | Freq: Four times a day (QID) | ORAL | Status: DC | PRN
  Administered 2021-03-12 – 2021-03-13 (×2): 650 mg via ORAL
  Filled 2021-03-12 (×2): qty 2

## 2021-03-12 MED ORDER — DEXAMETHASONE SODIUM PHOSPHATE 10 MG/ML IJ SOLN
8.0000 mg | Freq: Once | INTRAMUSCULAR | Status: AC
  Administered 2021-03-12: 8 mg via INTRAVENOUS

## 2021-03-12 MED ORDER — FLUTICASONE PROPIONATE 50 MCG/ACT NA SUSP
1.0000 | Freq: Every day | NASAL | Status: DC | PRN
  Filled 2021-03-12: qty 16

## 2021-03-12 MED ORDER — BUPIVACAINE IN DEXTROSE 0.75-8.25 % IT SOLN
INTRATHECAL | Status: DC | PRN
  Administered 2021-03-12: 1.6 mL via INTRATHECAL

## 2021-03-12 MED ORDER — SPIRONOLACTONE 25 MG PO TABS
25.0000 mg | ORAL_TABLET | Freq: Every day | ORAL | Status: DC
  Filled 2021-03-12: qty 1

## 2021-03-12 MED ORDER — PROPOFOL 500 MG/50ML IV EMUL
INTRAVENOUS | Status: DC | PRN
  Administered 2021-03-12: 75 ug/kg/min via INTRAVENOUS

## 2021-03-12 MED ORDER — PROPOFOL 1000 MG/100ML IV EMUL
INTRAVENOUS | Status: AC
  Filled 2021-03-12: qty 100

## 2021-03-12 MED ORDER — DEXAMETHASONE SODIUM PHOSPHATE 10 MG/ML IJ SOLN
10.0000 mg | Freq: Once | INTRAMUSCULAR | Status: AC
  Administered 2021-03-13: 10 mg via INTRAVENOUS
  Filled 2021-03-12: qty 1

## 2021-03-12 MED ORDER — ASPIRIN EC 325 MG PO TBEC
325.0000 mg | DELAYED_RELEASE_TABLET | Freq: Two times a day (BID) | ORAL | Status: DC
  Administered 2021-03-13: 325 mg via ORAL
  Filled 2021-03-12: qty 1

## 2021-03-12 MED ORDER — NITROGLYCERIN 0.4 MG SL SUBL: 0.4000 mg | SUBLINGUAL_TABLET | SUBLINGUAL | Status: DC | PRN

## 2021-03-12 MED ORDER — MENTHOL 3 MG MT LOZG: 1.0000 | LOZENGE | OROMUCOSAL | Status: DC | PRN

## 2021-03-12 MED ORDER — PHENYLEPHRINE 40 MCG/ML (10ML) SYRINGE FOR IV PUSH (FOR BLOOD PRESSURE SUPPORT)
PREFILLED_SYRINGE | INTRAVENOUS | Status: AC
  Filled 2021-03-12: qty 10

## 2021-03-12 MED ORDER — LEVOTHYROXINE SODIUM 100 MCG PO TABS
100.0000 ug | ORAL_TABLET | Freq: Every day | ORAL | Status: DC
  Administered 2021-03-13: 100 ug via ORAL
  Filled 2021-03-12: qty 1

## 2021-03-12 MED ORDER — POLYETHYLENE GLYCOL 3350 17 G PO PACK: 17.0000 g | PACK | Freq: Every day | ORAL | Status: DC | PRN

## 2021-03-12 MED ORDER — ONDANSETRON HCL 4 MG/2ML IJ SOLN
INTRAMUSCULAR | Status: AC
  Filled 2021-03-12: qty 2

## 2021-03-12 MED ORDER — BUPIVACAINE LIPOSOME 1.3 % IJ SUSP
INTRAMUSCULAR | Status: AC
  Filled 2021-03-12: qty 20

## 2021-03-12 MED ORDER — ROSUVASTATIN CALCIUM 20 MG PO TABS
20.0000 mg | ORAL_TABLET | Freq: Every day | ORAL | Status: DC
  Administered 2021-03-12: 20 mg via ORAL
  Filled 2021-03-12: qty 1

## 2021-03-12 MED ORDER — SODIUM CHLORIDE (PF) 0.9 % IJ SOLN
INTRAMUSCULAR | Status: AC
  Filled 2021-03-12: qty 10

## 2021-03-12 MED ORDER — HYDROMORPHONE HCL 2 MG PO TABS
2.0000 mg | ORAL_TABLET | Freq: Four times a day (QID) | ORAL | Status: DC | PRN
  Administered 2021-03-12 – 2021-03-13 (×4): 2 mg via ORAL
  Filled 2021-03-12 (×5): qty 1

## 2021-03-12 SURGICAL SUPPLY — 52 items
BAG COUNTER SPONGE SURGICOUNT (BAG) ×1 IMPLANT
BAG DECANTER FOR FLEXI CONT (MISCELLANEOUS) ×1 IMPLANT
BAG ZIPLOCK 12X15 (MISCELLANEOUS) ×2 IMPLANT
BLADE SAG 18X100X1.27 (BLADE) ×2 IMPLANT
BLADE SAW SGTL 11.0X1.19X90.0M (BLADE) ×2 IMPLANT
BNDG ELASTIC 6X5.8 VLCR STR LF (GAUZE/BANDAGES/DRESSINGS) ×2 IMPLANT
BOWL SMART MIX CTS (DISPOSABLE) ×2 IMPLANT
CEMENT HV SMART SET (Cement) ×4 IMPLANT
CEMENT TIBIA MBT SIZE 5 (Knees) IMPLANT
COVER SURGICAL LIGHT HANDLE (MISCELLANEOUS) ×2 IMPLANT
CUFF TOURN SGL QUICK 34 (TOURNIQUET CUFF) ×2
CUFF TRNQT CYL 34X4.125X (TOURNIQUET CUFF) ×1 IMPLANT
DRAPE INCISE IOBAN 66X45 STRL (DRAPES) ×2 IMPLANT
DRAPE U-SHAPE 47X51 STRL (DRAPES) ×2 IMPLANT
DRSG AQUACEL AG ADV 3.5X10 (GAUZE/BANDAGES/DRESSINGS) ×2 IMPLANT
DURAPREP 26ML APPLICATOR (WOUND CARE) ×2 IMPLANT
ELECT REM PT RETURN 15FT ADLT (MISCELLANEOUS) ×2 IMPLANT
FEMUR SIGMA PS SZ 5.0 R (Femur) ×1 IMPLANT
GLOVE SRG 8 PF TXTR STRL LF DI (GLOVE) ×1 IMPLANT
GLOVE SURG ENC MOIS LTX SZ6.5 (GLOVE) ×2 IMPLANT
GLOVE SURG ENC MOIS LTX SZ8 (GLOVE) ×4 IMPLANT
GLOVE SURG UNDER POLY LF SZ7 (GLOVE) ×2 IMPLANT
GLOVE SURG UNDER POLY LF SZ8 (GLOVE) ×2
GLOVE SURG UNDER POLY LF SZ8.5 (GLOVE) ×2 IMPLANT
GOWN STRL REUS W/TWL LRG LVL3 (GOWN DISPOSABLE) ×4 IMPLANT
GOWN STRL REUS W/TWL XL LVL3 (GOWN DISPOSABLE) ×2 IMPLANT
HANDPIECE INTERPULSE COAX TIP (DISPOSABLE) ×2
HOLDER FOLEY CATH W/STRAP (MISCELLANEOUS) ×1 IMPLANT
IMMOBILIZER KNEE 20 (SOFTGOODS) ×2
IMMOBILIZER KNEE 20 THIGH 36 (SOFTGOODS) ×1 IMPLANT
KIT TURNOVER KIT A (KITS) IMPLANT
MANIFOLD NEPTUNE II (INSTRUMENTS) ×2 IMPLANT
NS IRRIG 1000ML POUR BTL (IV SOLUTION) ×2 IMPLANT
PACK TOTAL KNEE CUSTOM (KITS) ×2 IMPLANT
PADDING CAST COTTON 6X4 STRL (CAST SUPPLIES) ×3 IMPLANT
PATELLA DOME PFC 38MM (Knees) ×1 IMPLANT
PLATE ROT INSERT 10MM SIZE 5 (Plate) ×1 IMPLANT
PROTECTOR NERVE ULNAR (MISCELLANEOUS) ×2 IMPLANT
SET HNDPC FAN SPRY TIP SCT (DISPOSABLE) ×1 IMPLANT
SPIKE FLUID TRANSFER (MISCELLANEOUS) ×1 IMPLANT
SPONGE T-LAP 18X18 ~~LOC~~+RFID (SPONGE) ×6 IMPLANT
STRIP CLOSURE SKIN 1/2X4 (GAUZE/BANDAGES/DRESSINGS) ×4 IMPLANT
SUT MNCRL AB 4-0 PS2 18 (SUTURE) ×2 IMPLANT
SUT STRATAFIX 0 PDS 27 VIOLET (SUTURE) ×2
SUT VIC AB 2-0 CT1 27 (SUTURE) ×6
SUT VIC AB 2-0 CT1 TAPERPNT 27 (SUTURE) ×3 IMPLANT
SUTURE STRATFX 0 PDS 27 VIOLET (SUTURE) ×1 IMPLANT
TIBIA MBT CEMENT SIZE 5 (Knees) ×2 IMPLANT
TRAY FOLEY MTR SLVR 16FR STAT (SET/KITS/TRAYS/PACK) ×2 IMPLANT
TUBE SUCTION HIGH CAP CLEAR NV (SUCTIONS) ×2 IMPLANT
WATER STERILE IRR 1000ML POUR (IV SOLUTION) ×4 IMPLANT
WRAP KNEE MAXI GEL POST OP (GAUZE/BANDAGES/DRESSINGS) ×2 IMPLANT

## 2021-03-12 NOTE — Progress Notes (Signed)
Orthopedic Tech Progress Note Patient Details:  [name] [name] 1932/03/05 [id number]  CPM Right Knee CPM Right Knee: On Right Knee Flexion (Degrees): 40 Right Knee Extension (Degrees): 10  Post Interventions Patient Tolerated: Well Instructions Provided: Care of device  [name] 03/12/2021, 10:56 AM

## 2021-03-12 NOTE — Evaluation (Signed)
Physical Therapy Evaluation Patient Details Name: [name] [name] MRN: [id number] DOB: 05-20-1932 Today's Date: 03/12/2021  History of Present Illness  88 yo male s/p R TKA.PMH: discectomy L2-4, gout, MM, HTN, vertebral fx, CAD. CABG, R RCR  Clinical Impression  Pt is s/p TKA resulting in the deficits listed below (see PT Problem List).  Pt amb 80' with RW and min assist. Anticipate steady progress  Pt will benefit from skilled PT to increase their independence and safety with mobility to allow discharge to the venue listed below.         Recommendations for follow up therapy are one component of a multi-disciplinary discharge planning process, led by the attending physician.  Recommendations may be updated based on patient status, additional functional criteria and insurance authorization.  Follow Up Recommendations Follow physician's recommendations for discharge plan and follow up therapies    Assistance Recommended at Discharge Frequent or constant Supervision/Assistance  Patient can return home with the following  Help with stairs or ramp for entrance;A little help with walking and/or transfers;A little help with bathing/dressing/bathroom;Assist for transportation;Assistance with cooking/housework    Equipment Recommendations None recommended by PT  Recommendations for Other Services       Functional Status Assessment Patient has had a recent decline in their functional status and demonstrates the ability to make significant improvements in function in a reasonable and predictable amount of time.     Precautions / Restrictions Precautions Precautions: Fall;Knee Restrictions Weight Bearing Restrictions: No Other Position/Activity Restrictions: WBAT      Mobility  Bed Mobility Overal bed mobility: Needs Assistance Bed Mobility: Supine to Sit     Supine to sit: Min assist, Min guard     General bed mobility comments: to elevate trunk    Transfers Overall transfer  level: Needs assistance Equipment used: Rolling walker (2 wheels) Transfers: Sit to/from Stand Sit to Stand: Min assist           General transfer comment: cues for hand placement and RLE position    Ambulation/Gait Ambulation/Gait assistance: Min assist Gait Distance (Feet): 80 Feet Assistive device: Rolling walker (2 wheels) Gait Pattern/deviations: Step-to pattern       General Gait Details: cues for sequence and RW  position  Stairs            Wheelchair Mobility    Modified Rankin (Stroke Patients Only)       Balance                                             Pertinent Vitals/Pain Pain Assessment Pain Assessment: 0-10 Pain Score: 10-Worst pain ever Pain Location: right knee Pain Descriptors / Indicators: Grimacing, Sore Pain Intervention(s): Premedicated before session, Monitored during session, Limited activity within patient's tolerance, Repositioned, Ice applied    Home Living Family/patient expects to be discharged to:: Private residence Living Arrangements: Spouse/significant other Available Help at Discharge: Family;Available 24 hours/day Type of Home: House Home Access: Stairs to enter   Entrance Stairs-Number of Steps: 3 Alternate Level Stairs-Number of Steps: has chair lift (flight, has chair lift) Home Layout: Two level Home Equipment: Conservation officer, nature (2 wheels);Cane - single point      Prior Function Prior Level of Function : Independent/Modified Independent             Mobility Comments: cane for longer distances       Hand  Dominance        Extremity/Trunk Assessment   Upper Extremity Assessment Upper Extremity Assessment: RUE deficits/detail;LUE deficits/detail RUE Deficits / Details: bil UE deficits d/t shoulder impairments L>R    Lower Extremity Assessment Lower Extremity Assessment: RLE deficits/detail RLE Deficits / Details: ankle WFL, knee extension and hip flexion 2+/5, AAROM grossly 10 to  55 degrees       Communication   Communication: No difficulties  Cognition Arousal/Alertness: Awake/alert Behavior During Therapy: WFL for tasks assessed/performed Overall Cognitive Status: Within Functional Limits for tasks assessed                                          General Comments      Exercises Total Joint Exercises Ankle Circles/Pumps: AROM, Both, 10 reps   Assessment/Plan    PT Assessment Patient needs continued PT services  PT Problem List Decreased strength;Decreased mobility;Decreased range of motion;Decreased activity tolerance;Decreased balance;Decreased knowledge of use of DME;Pain       PT Treatment Interventions DME instruction;Therapeutic exercise;Gait training;Stair training;Functional mobility training;Therapeutic activities;Patient/family education    PT Goals (Current goals can be found in the Care Plan section)  Acute Rehab PT Goals Patient Stated Goal: home when ready PT Goal Formulation: With patient Time For Goal Achievement: 03/19/21 Potential to Achieve Goals: Good    Frequency 7X/week     Co-evaluation               AM-PAC PT "6 Clicks" Mobility  Outcome Measure Help needed turning from your back to your side while in a flat bed without using bedrails?: A Little Help needed moving from lying on your back to sitting on the side of a flat bed without using bedrails?: A Little Help needed moving to and from a bed to a chair (including a wheelchair)?: A Little Help needed standing up from a chair using your arms (e.g., wheelchair or bedside chair)?: A Little Help needed to walk in hospital room?: A Little Help needed climbing 3-5 steps with a railing? : A Lot 6 Click Score: 17    End of Session Equipment Utilized During Treatment: Gait belt Activity Tolerance: Patient tolerated treatment well Patient left: with call bell/phone within reach;in chair;with family/visitor present;with chair alarm set   PT Visit  Diagnosis: Other abnormalities of gait and mobility (R26.89);Difficulty in walking, not elsewhere classified (R26.2)    Time: 4034-7425 PT Time Calculation (min) (ACUTE ONLY): 32 min   Charges:   PT Evaluation $PT Eval Low Complexity: 1 Low PT Treatments $Gait Training: 8-22 mins        Baxter Flattery, PT  Acute Rehab Dept ([place]) [phone] Pager [phone]  03/12/2021   [facility] 03/12/2021, 3:55 PM

## 2021-03-12 NOTE — Progress Notes (Signed)
Pt cpap set up, will place on self when ready.

## 2021-03-12 NOTE — Discharge Instructions (Signed)
[name] Arabian, MD Total Joint Specialist [facility] [address], [address] [place], [place] [address] [phone]  TOTAL KNEE REPLACEMENT POSTOPERATIVE DIRECTIONS    Knee Rehabilitation, Guidelines Following Surgery  Results after knee surgery are often greatly improved when you follow the exercise, range of motion and muscle strengthening exercises prescribed by your doctor. Safety measures are also important to protect the knee from further injury. If any of these exercises cause you to have increased pain or swelling in your knee joint, decrease the amount until you are comfortable again and slowly increase them. If you have problems or questions, call your caregiver or physical therapist for advice.   BLOOD CLOT PREVENTION Take a 325 mg Aspirin two times a day for three weeks following surgery. Then resume one 81 mg Aspirin once a day. You may resume your vitamins/supplements upon discharge from the hospital. Do not take any NSAIDs (Advil, Aleve, Ibuprofen, Meloxicam, etc.) until you have discontinued the 325 mg Aspirin.  HOME CARE INSTRUCTIONS  Remove items at home which could result in a fall. This includes throw rugs or furniture in walking pathways.  ICE to the affected knee as much as tolerated. Icing helps control swelling. If the swelling is well controlled you will be more comfortable and rehab easier. Continue to use ice on the knee for pain and swelling from surgery. You may notice swelling that will progress down to the foot and ankle. This is normal after surgery. Elevate the leg when you are not up walking on it.    Continue to use the breathing machine which will help keep your temperature down. It is common for your temperature to cycle up and down following surgery, especially at night when you are not up moving around and exerting yourself. The breathing machine keeps your lungs expanded and your temperature down. Do not place pillow under the  operative knee, focus on keeping the knee straight while resting  DIET You may resume your previous home diet once you are discharged from the hospital.  DRESSING / WOUND CARE / SHOWERING Keep your bulky bandage on for 2 days. On the third post-operative day you may remove the Ace bandage and gauze. There is a waterproof adhesive bandage on your skin which will stay in place until your first follow-up appointment. Once you remove this you will not need to place another bandage You may begin showering 3 days following surgery, but do not submerge the incision under water.  ACTIVITY For the first 5 days, the key is rest and control of pain and swelling Do your home exercises twice a day starting on post-operative day 3. On the days you go to physical therapy, just do the home exercises once that day. You should rest, ice and elevate the leg for 50 minutes out of every hour. Get up and walk/stretch for 10 minutes per hour. After 5 days you can increase your activity slowly as tolerated. Walk with your walker as instructed. Use the walker until you are comfortable transitioning to a cane. Walk with the cane in the opposite hand of the operative leg. You may discontinue the cane once you are comfortable and walking steadily. Avoid periods of inactivity such as sitting longer than an hour when not asleep. This helps prevent blood clots.  You may discontinue the knee immobilizer once you are able to perform a straight leg raise while lying down. You may resume a sexual relationship in one month or when given the OK by  your doctor.  You may return to work once you are cleared by your doctor.  Do not drive a car for 6 weeks or until released by your surgeon.  Do not drive while taking narcotics.  TED HOSE STOCKINGS Wear the elastic stockings on both legs for three weeks following surgery during the day. You may remove them at night for sleeping.  WEIGHT BEARING Weight bearing as tolerated with assist  device (walker, cane, etc) as directed, use it as long as suggested by your surgeon or therapist, typically at least 4-6 weeks.  POSTOPERATIVE CONSTIPATION PROTOCOL Constipation - defined medically as fewer than three stools per week and severe constipation as less than one stool per week.  One of the most common issues patients have following surgery is constipation.  Even if you have a regular bowel pattern at home, your normal regimen is likely to be disrupted due to multiple reasons following surgery.  Combination of anesthesia, postoperative narcotics, change in appetite and fluid intake all can affect your bowels.  In order to avoid complications following surgery, here are some recommendations in order to help you during your recovery period.  Colace (docusate) - Pick up an over-the-counter form of Colace or another stool softener and take twice a day as long as you are requiring postoperative pain medications.  Take with a full glass of water daily.  If you experience loose stools or diarrhea, hold the colace until you stool forms back up. If your symptoms do not get better within 1 week or if they get worse, check with your doctor. Dulcolax (bisacodyl) - Pick up over-the-counter and take as directed by the product packaging as needed to assist with the movement of your bowels.  Take with a full glass of water.  Use this product as needed if not relieved by Colace only.  MiraLax (polyethylene glycol) - Pick up over-the-counter to have on hand. MiraLax is a solution that will increase the amount of water in your bowels to assist with bowel movements.  Take as directed and can mix with a glass of water, juice, soda, coffee, or tea. Take if you go more than two days without a movement. Do not use MiraLax more than once per day. Call your doctor if you are still constipated or irregular after using this medication for 7 days in a row.  If you continue to have problems with postoperative constipation,  please contact the office for further assistance and recommendations.  If you experience "the worst abdominal pain ever" or develop nausea or vomiting, please contact the office immediatly for further recommendations for treatment.  ITCHING If you experience itching with your medications, try taking only a single pain pill, or even half a pain pill at a time.  You can also use Benadryl over the counter for itching or also to help with sleep.   MEDICATIONS See your medication summary on the After Visit Summary that the nursing staff will review with you prior to discharge.  You may have some home medications which will be placed on hold until you complete the course of blood thinner medication.  It is important for you to complete the blood thinner medication as prescribed by your surgeon.  Continue your approved medications as instructed at time of discharge.  PRECAUTIONS If you experience chest pain or shortness of breath - call 911 immediately for transfer to the hospital emergency department.  If you develop a fever greater that 101 F, purulent drainage from wound, increased redness  or drainage from wound, foul odor from the wound/dressing, or calf pain - CONTACT YOUR SURGEON.                                                   FOLLOW-UP APPOINTMENTS Make sure you keep all of your appointments after your operation with your surgeon and caregivers. You should call the office at the above phone number and make an appointment for approximately two weeks after the date of your surgery or on the date instructed by your surgeon outlined in the "After Visit Summary".  RANGE OF MOTION AND STRENGTHENING EXERCISES  Rehabilitation of the knee is important following a knee injury or an operation. After just a few days of immobilization, the muscles of the thigh which control the knee become weakened and shrink (atrophy). Knee exercises are designed to build up the tone and strength of the thigh muscles and to  improve knee motion. Often times heat used for twenty to thirty minutes before working out will loosen up your tissues and help with improving the range of motion but do not use heat for the first two weeks following surgery. These exercises can be done on a training (exercise) mat, on the floor, on a table or on a bed. Use what ever works the best and is most comfortable for you Knee exercises include:  Leg Lifts - While your knee is still immobilized in a splint or cast, you can do straight leg raises. Lift the leg to 60 degrees, hold for 3 sec, and slowly lower the leg. Repeat 10-20 times 2-3 times daily. Perform this exercise against resistance later as your knee gets better.  Quad and Hamstring Sets - Tighten up the muscle on the front of the thigh (Quad) and hold for 5-10 sec. Repeat this 10-20 times hourly. Hamstring sets are done by pushing the foot backward against an object and holding for 5-10 sec. Repeat as with quad sets.  Leg Slides: Lying on your back, slowly slide your foot toward your buttocks, bending your knee up off the floor (only go as far as is comfortable). Then slowly slide your foot back down until your leg is flat on the floor again. Angel Wings: Lying on your back spread your legs to the side as far apart as you can without causing discomfort.  A rehabilitation program following serious knee injuries can speed recovery and prevent re-injury in the future due to weakened muscles. Contact your doctor or a physical therapist for more information on knee rehabilitation.   POST-OPERATIVE OPIOID TAPER INSTRUCTIONS: It is important to wean off of your opioid medication as soon as possible. If you do not need pain medication after your surgery it is ok to stop day one. Opioids include: Codeine, Hydrocodone(Norco, Vicodin), Oxycodone(Percocet, oxycontin) and hydromorphone amongst others.  Long term and even short term use of opiods can cause: Increased pain  response Dependence Constipation Depression Respiratory depression And more.  Withdrawal symptoms can include Flu like symptoms Nausea, vomiting And more Techniques to manage these symptoms Hydrate well Eat regular healthy meals Stay active Use relaxation techniques(deep breathing, meditating, yoga) Do Not substitute Alcohol to help with tapering If you have been on opioids for less than two weeks and do not have pain than it is ok to stop all together.  Plan to wean off of opioids This  plan should start within one week post op of your joint replacement. Maintain the same interval or time between taking each dose and first decrease the dose.  Cut the total daily intake of opioids by one tablet each day Next start to increase the time between doses. The last dose that should be eliminated is the evening dose.   IF YOU ARE TRANSFERRED TO A SKILLED REHAB FACILITY If the patient is transferred to a skilled rehab facility following release from the hospital, a list of the current medications will be sent to the facility for the patient to continue.  When discharged from the skilled rehab facility, please have the facility set up the patient's [place] prior to being released. Also, the skilled facility will be responsible for providing the patient with their medications at time of release from the facility to include their pain medication, the muscle relaxants, and their blood thinner medication. If the patient is still at the rehab facility at time of the two week follow up appointment, the skilled rehab facility will also need to assist the patient in arranging follow up appointment in our office and any transportation needs.  MAKE SURE YOU:  Understand these instructions.  Get help right away if you are not doing well or get worse.   DENTAL ANTIBIOTICS:  In most cases prophylactic antibiotics for Dental procdeures after total joint surgery are not  necessary.  Exceptions are as follows:  1. History of prior total joint infection  2. Severely immunocompromised (Organ Transplant, cancer chemotherapy, Rheumatoid biologic meds such as [place])  3. Poorly controlled diabetes (A1C &gt; 8.0, blood glucose over 200)  If you have one of these conditions, contact your surgeon for an antibiotic prescription, prior to your dental procedure.    Pick up stool softner and laxative for home use following surgery while on pain medications. Do not submerge incision under water. Please use good hand washing techniques while changing dressing each day. May shower starting three days after surgery. Please use a clean towel to pat the incision dry following showers. Continue to use ice for pain and swelling after surgery. Do not use any lotions or creams on the incision until instructed by your surgeon.

## 2021-03-12 NOTE — Progress Notes (Signed)
Orthopedic Tech Progress Note Patient Details:  [name] [name] 07/14/1932 [id number]  CPM Right Knee CPM Right Knee: Off Right Knee Flexion (Degrees): 40 Right Knee Extension (Degrees): 10  Post Interventions Patient Tolerated: Well Instructions Provided: Care of device  [name] 03/12/2021, 2:33 PM

## 2021-03-12 NOTE — Op Note (Signed)
OPERATIVE REPORT-TOTAL KNEE ARTHROPLASTY   Pre-operative diagnosis- Osteoarthritis  Right knee(s)  Post-operative diagnosis- Osteoarthritis Right knee(s)  Procedure-  Right  Total Knee Arthroplasty  Surgeon- Dione Plover. [place]ene [place], MD  Assistant- Theresa Duty, PA-C   Anesthesia-   Adductor canal block and spinal  EBL- 25 ml   Drains None  Tourniquet time- 40 minutes @ 950 mm Hg  Complications- None  Condition-PACU - hemodynamically stable.    BRIEF CLINICAL NOTE:   [name] [name] is a 88 y.o. year old male with end stage OA of his right knee with progressively worsening pain and dysfunction. He has constant pain, with activity and at rest and significant functional deficits with difficulties even with ADLs. He has had extensive non-op management including analgesics, injections of cortisone and viscosupplements, and home exercise program, but remains in significant pain with significant dysfunction. Radiographs show bone on bone arthritis medial and patellofemoral. He presents now for right Total Knee Arthroplasty.    PROCEDURE IN DETAIL: The patient is brought into the operating room and positioned supine on the operating table. After successful administration of  Adductor canal block and spinal,   a tourniquet is placed high on the  Right thigh(s) and the lower extremity is prepped and draped in the usual sterile fashion. Time out is performed by the operating team and then the  Right lower extremity is wrapped in Esm[place], knee flexed and the tourniquet inflated to 300 mmHg.       A midline incision is made with a ten blade through the subcutaneous tissue to the level of the extensor mechanism. A fresh blade is used to make a medial parapatellar arthrotomy. Soft tissue over the proximal medial tibia is subperiosteally elevated to the joint line with a knife and into the semimembranosus bursa with a Cobb elevator. Soft tissue over the proximal lateral tibia is elevated with attention  being paid to avoiding the patellar tendon on the tibial tubercle. The patella is everted, knee flexed 90 degrees and the ACL and PCL are removed. Findings are bone on bone all 3 compartments with massive global osteophytes        The drill is used to create a starting hole in the distal femur and the canal is thoroughly irrigated with sterile saline to remove the fatty contents. The 5 degree Right  valgus alignment guide is placed into the femoral canal and the distal femoral cutting block is pinned to remove 10 mm off the distal femur. Resection is made with an oscillating saw.      The tibia is subluxed forward and the menisci are removed. The extramedullary alignment guide is placed referencing proximally at the medial aspect of the tibial tubercle and distally along the second metatarsal axis and tibial crest. The block is pinned to remove 60mm off the more deficient medial  side. Resection is made with an oscillating saw. Size 5is the most appropriate size for the tibia and the proximal tibia is prepared with the modular drill and keel punch for that size.      The femoral sizing guide is placed and size 5 is most appropriate. Rotation is marked off the epicondylar axis and confirmed by creating a rectangular flexion gap at 90 degrees. The size 5 cutting block is pinned in this rotation and the anterior, posterior and chamfer cuts are made with the oscillating saw. The intercondylar block is then placed and that cut is made.      Trial size 5 tibial component, trial size  5 posterior stabilized femur and a 10  mm posterior stabilized rotating platform insert trial is placed. Full extension is achieved with excellent varus/valgus and anterior/posterior balance throughout full range of motion. The patella is everted and thickness measured to be 25  mm. Free hand resection is taken to 15 mm, a 38 template is placed, lug holes are drilled, trial patella is placed, and it tracks normally. Osteophytes are removed  off the posterior femur with the trial in place. All trials are removed and the cut bone surfaces prepared with pulsatile lavage. Cement is mixed and once ready for implantation, the size 5 tibial implant, size  5 posterior stabilized femoral component, and the size 38 patella are cemented in place and the patella is held with the clamp. The trial insert is placed and the knee held in full extension. The Exparel (20 ml mixed with 60 ml saline) is injected into the extensor mechanism, posterior capsule, medial and lateral gutters and subcutaneous tissues.  All extruded cement is removed and once the cement is hard the permanent 10 mm posterior stabilized rotating platform insert is placed into the tibial tray.      The wound is copiously irrigated with saline solution and the extensor mechanism closed with # 0 Stratofix suture. The tourniquet is released for a total tourniquet time of 40  minutes. Flexion against gravity is 140 degrees and the patella tracks normally. Subcutaneous tissue is closed with 2.0 vicryl and subcuticular with running 4.0 Monocryl. The incision is cleaned and dried and steri-strips and a bulky sterile dressing are applied. The limb is placed into a knee immobilizer and the patient is awakened and transported to recovery in stable condition.      Please note that a surgical assistant was a medical necessity for this procedure in order to perform it in a safe and expeditious manner. Surgical assistant was necessary to retract the ligaments and vital neurovascular structures to prevent injury to them and also necessary for proper positioning of the limb to allow for anatomic placement of the prosthesis.   Dione Plover Adiya Selmer, MD    03/12/2021, 10:31 AM

## 2021-03-12 NOTE — Transfer of Care (Signed)
Immediate Anesthesia Transfer of Care Note  Patient: [name] [name]  Procedure(s) Performed: TOTAL KNEE ARTHROPLASTY (Right: Knee)  Patient Location: PACU  Anesthesia Type:Spinal  Level of Consciousness: awake  Airway & Oxygen Therapy: Patient Spontanous Breathing and Patient connected to face mask oxygen  Post-op Assessment: Report given to RN and Post -op Vital signs reviewed and stable  Post vital signs: Reviewed and stable  Last Vitals:  Vitals Value Taken Time  BP 135/60 03/12/21 1103  Temp    Pulse 61 03/12/21 1105  Resp 19 03/12/21 1105  SpO2 99 % 03/12/21 1105  Vitals shown include unvalidated device data.  Last Pain:  Vitals:   03/12/21 0850  TempSrc:   PainSc: 0-No pain         Complications: No notable events documented.

## 2021-03-12 NOTE — Anesthesia Procedure Notes (Signed)
Spinal  Patient location during procedure: OR Start time: 03/12/2021 9:10 AM End time: 03/12/2021 9:18 AM Reason for block: surgical anesthesia Staffing Performed: anesthesiologist  Anesthesiologist: Santa Lighter, MD Preanesthetic Checklist Completed: patient identified, IV checked, risks and benefits discussed, surgical consent, monitors and equipment checked, pre-op evaluation and timeout performed Spinal Block Patient position: sitting Prep: DuraPrep and site prepped and draped Patient monitoring: continuous pulse ox and blood pressure Approach: midline Location: L3-4 Injection technique: single-shot Needle Needle type: Quincke  Needle gauge: 22 G Assessment Sensory level: T8 Events: CSF return Additional Notes Functioning IV was confirmed and monitors were applied. Sterile prep and drape, including hand hygiene, mask and sterile gloves were used. The patient was positioned and the spine was prepped. The skin was anesthetized with lidocaine.  Free flow of clear CSF was obtained prior to injecting local anesthetic into the CSF.  The needle was carefully withdrawn.  The patient tolerated the procedure well. Consent was obtained prior to procedure with all questions answered and concerns addressed. Risks including but not limited to bleeding, infection, nerve damage, paralysis, failed block, inadequate analgesia, allergic reaction, high spinal, itching and headache were discussed and the patient wished to proceed.   Hoy Morn, MD

## 2021-03-12 NOTE — Progress Notes (Signed)
Assisted Dr. Turk with right, ultrasound guided, adductor canal block. Side rails up, monitors on throughout procedure. See vital signs in flow sheet. Tolerated Procedure well.  

## 2021-03-12 NOTE — Interval H&P Note (Signed)
History and Physical Interval Note:  03/12/2021 6:58 AM  [name] [name]  has presented today for surgery, with the diagnosis of right knee osteoarthritis.  The various methods of treatment have been discussed with the patient and family. After consideration of risks, benefits and other options for treatment, the patient has consented to  Procedure(s): TOTAL KNEE ARTHROPLASTY (Right) as a surgical intervention.  The patient's history has been reviewed, patient examined, no change in status, stable for surgery.  I have reviewed the patient's chart and labs.  Questions were answered to the patient's satisfaction.     [name]

## 2021-03-12 NOTE — Plan of Care (Signed)
°  Problem: Education: Goal: Knowledge of General Education information will improve Description: Including pain rating scale, medication(s)/side effects and non-pharmacologic comfort measures Outcome: Progressing   Problem: Activity: Goal: Risk for activity intolerance will decrease Outcome: Progressing   Problem: Nutrition: Goal: Adequate nutrition will be maintained Outcome: Progressing   Problem: Pain Managment: Goal: General experience of comfort will improve Outcome: Progressing   Problem: Education: Goal: Knowledge of the prescribed therapeutic regimen will improve Outcome: Progressing   Problem: Activity: Goal: Ability to avoid complications of mobility impairment will improve Outcome: Progressing   Problem: Pain Management: Goal: Pain level will decrease with appropriate interventions Outcome: Progressing

## 2021-03-12 NOTE — Anesthesia Postprocedure Evaluation (Signed)
Anesthesia Post Note  Patient: [name] [name]  Procedure(s) Performed: TOTAL KNEE ARTHROPLASTY (Right: Knee)     Patient location during evaluation: PACU Anesthesia Type: Spinal Level of consciousness: awake, awake and alert and oriented Pain management: pain level controlled Vital Signs Assessment: post-procedure vital signs reviewed and stable Respiratory status: spontaneous breathing, nonlabored ventilation and respiratory function stable Cardiovascular status: blood pressure returned to baseline and stable Postop Assessment: no headache, no backache, spinal receding and no apparent nausea or vomiting Anesthetic complications: no   No notable events documented.  Last Vitals:  Vitals:   03/12/21 1200 03/12/21 1234  BP: 135/73 (!) 148/70  Pulse:  (!) 57  Resp: 18 20  Temp:  36.4 C  SpO2:  100%    Last Pain:  Vitals:   03/12/21 1445  TempSrc:   PainSc: [place]

## 2021-03-12 NOTE — Anesthesia Procedure Notes (Signed)
Procedure Name: MAC Date/Time: 03/12/2021 9:13 AM Performed by: Niel Hummer, CRNA Pre-anesthesia Checklist: Patient identified, Emergency Drugs available, Suction available and Patient being monitored Oxygen Delivery Method: Simple face mask

## 2021-03-13 ENCOUNTER — Encounter (HOSPITAL_COMMUNITY): Payer: Self-pay | Admitting: Orthopedic Surgery

## 2021-03-13 DIAGNOSIS — M1711 Unilateral primary osteoarthritis, right knee: Secondary | ICD-10-CM | POA: Diagnosis not present

## 2021-03-13 LAB — CBC
HCT: 31.8 % — ABNORMAL LOW (ref 39.0–52.0)
Hemoglobin: 10.9 g/dL — ABNORMAL LOW (ref 13.0–17.0)
MCH: 33.7 pg (ref 26.0–34.0)
MCHC: 34.3 g/dL (ref 30.0–36.0)
MCV: 98.5 fL (ref 80.0–100.0)
Platelets: 124 10*3/uL — ABNORMAL LOW (ref 150–400)
RBC: 3.23 MIL/uL — ABNORMAL LOW (ref 4.22–5.81)
RDW: 14.6 % (ref 11.5–15.5)
WBC: 11.5 10*3/uL — ABNORMAL HIGH (ref 4.0–10.5)
nRBC: 0 % (ref 0.0–0.2)

## 2021-03-13 LAB — BASIC METABOLIC PANEL
Anion gap: 9 (ref 5–15)
BUN: 30 mg/dL — ABNORMAL HIGH (ref 8–23)
CO2: 21 mmol/L — ABNORMAL LOW (ref 22–32)
Calcium: 9.1 mg/dL (ref 8.9–10.3)
Chloride: 104 mmol/L (ref 98–111)
Creatinine, Ser: 1.07 mg/dL (ref 0.61–1.24)
GFR, Estimated: >60 mL/min (ref >60)
Glucose, Bld: 147 mg/dL — ABNORMAL HIGH (ref 70–99)
Potassium: 5.6 mmol/L — ABNORMAL HIGH (ref 3.5–5.1)
Sodium: 134 mmol/L — ABNORMAL LOW (ref 135–145)

## 2021-03-13 MED ORDER — GABAPENTIN 300 MG PO CAPS: ORAL_CAPSULE | ORAL | 0 refills | Status: DC

## 2021-03-13 MED ORDER — ASPIRIN 325 MG PO TBEC: 325.0000 mg | DELAYED_RELEASE_TABLET | Freq: Two times a day (BID) | ORAL | 0 refills | Status: DC

## 2021-03-13 MED ORDER — METHOCARBAMOL 500 MG PO TABS: 500.0000 mg | ORAL_TABLET | Freq: Four times a day (QID) | ORAL | 0 refills | Status: DC | PRN

## 2021-03-13 MED ORDER — HYDROMORPHONE HCL 2 MG PO TABS: 2.0000 mg | ORAL_TABLET | Freq: Four times a day (QID) | ORAL | 0 refills | Status: DC | PRN

## 2021-03-13 NOTE — Progress Notes (Signed)
Physical Therapy Treatment Patient Details Name: [name] [name] MRN: [id number] DOB: 23-Dec-1932 Today's Date: 03/13/2021   History of Present Illness 88 yo male s/p R TKA.PMH: discectomy L2-4, gout, MM, HTN, vertebral fx, CAD. CABG, R RCR    PT Comments    Pt is progressing well. Will see for a second session to review stairs this pm and pt should be ready to d/c later today   Recommendations for follow up therapy are one component of a multi-disciplinary discharge planning process, led by the attending physician.  Recommendations may be updated based on patient status, additional functional criteria and insurance authorization.  Follow Up Recommendations  Follow physician's recommendations for discharge plan and follow up therapies     Assistance Recommended at Discharge Frequent or constant Supervision/Assistance  Patient can return home with the following Help with stairs or ramp for entrance;A little help with walking and/or transfers;A little help with bathing/dressing/bathroom;Assist for transportation;Assistance with cooking/housework   Equipment Recommendations  None recommended by PT    Recommendations for Other Services       Precautions / Restrictions Precautions Precautions: Fall;Knee Restrictions Weight Bearing Restrictions: No Other Position/Activity Restrictions: WBAT     Mobility  Bed Mobility Overal bed mobility: Needs Assistance Bed Mobility: Supine to Sit     Supine to sit: Min guard     General bed mobility comments: for safety, incr time, use of rail (has adjustable bed at home)    Transfers Overall transfer level: Needs assistance Equipment used: Rolling walker (2 wheels) Transfers: Sit to/from Stand Sit to Stand: Min guard           General transfer comment: cues for hand placement and RLE position; min/guard for safety from bed, recliner, BSC    Ambulation/Gait Ambulation/Gait assistance: Min guard, Min assist Gait Distance (Feet):  110 Feet (15' x2) Assistive device: Rolling walker (2 wheels) Gait Pattern/deviations: Step-to pattern, Decreased stance time - right       General Gait Details: cues for sequence and RW  position   Stairs             Wheelchair Mobility    Modified Rankin (Stroke Patients Only)       Balance                                            Cognition Arousal/Alertness: Awake/alert Behavior During Therapy: WFL for tasks assessed/performed Overall Cognitive Status: Within Functional Limits for tasks assessed                                          Exercises Total Joint Exercises Ankle Circles/Pumps: AROM, Both, 10 reps Quad Sets: AROM, Both, 5 reps Heel Slides: AAROM, Right, 5 reps Straight Leg Raises: AROM, Right, 5 reps    General Comments        Pertinent Vitals/Pain Pain Assessment Pain Assessment: 0-10 Pain Score: 4  Pain Location: right knee Pain Descriptors / Indicators: Grimacing, Sore Pain Intervention(s): Limited activity within patient's tolerance, Monitored during session, Premedicated before session, Repositioned    Home Living                          Prior Function  PT Goals (current goals can now be found in the care plan section) Acute Rehab PT Goals Patient Stated Goal: home when ready PT Goal Formulation: With patient Time For Goal Achievement: 03/19/21 Potential to Achieve Goals: Good Progress towards PT goals: Progressing toward goals    Frequency    7X/week      PT Plan Current plan remains appropriate    Co-evaluation              AM-PAC PT "6 Clicks" Mobility   Outcome Measure  Help needed turning from your back to your side while in a flat bed without using bedrails?: A Little Help needed moving from lying on your back to sitting on the side of a flat bed without using bedrails?: A Little Help needed moving to and from a bed to a chair (including a  wheelchair)?: A Little Help needed standing up from a chair using your arms (e.g., wheelchair or bedside chair)?: A Little Help needed to walk in hospital room?: A Little Help needed climbing 3-5 steps with a railing? : A Lot 6 Click Score: 17    End of Session Equipment Utilized During Treatment: Gait belt Activity Tolerance: Patient tolerated treatment well Patient left: with call bell/phone within reach;in chair;with family/visitor present;with chair alarm set Nurse Communication: Mobility status PT Visit Diagnosis: Other abnormalities of gait and mobility (R26.89);Difficulty in walking, not elsewhere classified (R26.2)     Time: 0802-2336 PT Time Calculation (min) (ACUTE ONLY): 29 min  Charges:  $Gait Training: 23-37 mins                     Baxter Flattery, PT  Acute Rehab Dept ([place]) [phone] Pager [phone]  03/13/2021    [facility] 03/13/2021, 10:44 AM

## 2021-03-13 NOTE — Progress Notes (Signed)
Subjective: 1 Day Post-Op Procedure(s) (LRB): TOTAL KNEE ARTHROPLASTY (Right) Patient reports pain as mild.   Patient seen in rounds by Dr. Wynelle Link. Patient is well, and has had no acute complaints or problems. Denies SOB, chest pain, or calf pain. No acute overnight events. Ambulated 80 feet with therapy yesterday. Will continue therapy today.      Objective: Vital signs in last 24 hours: Temp:  [97.4 F (36.3 C)-97.8 F (36.6 C)] 97.8 F (36.6 C) (02/14 0931) Pulse Rate:  [57-78] 77 (02/14 0931) Resp:  [10-20] 18 (02/14 0931) BP: (101-159)/(56-78) 112/56 (02/14 0931) SpO2:  [98 %-100 %] 98 % (02/14 0931) Weight:  [106.1 kg] 106.1 kg (02/13 1234)  Intake/Output from previous day:  Intake/Output Summary (Last 24 hours) at 03/13/2021 0949 Last data filed at 03/13/2021 0900 Gross per 24 hour  Intake 3497.5 ml  Output 3280 ml  Net 217.5 ml     Intake/Output this shift: Total I/O In: 240 [P.O.:240] Out: 300 [Urine:300]  Labs: Recent Labs    03/13/21 0343  HGB 10.9*   Recent Labs    03/13/21 0343  WBC 11.5*  RBC 3.23*  HCT 31.8*  PLT 124*   Recent Labs    03/13/21 0343  NA 134*  K 5.6*  CL 104  CO2 21*  BUN 30*  CREATININE 1.07  GLUCOSE 147*  CALCIUM 9.1   No results for input(s): LABPT, INR in the last 72 hours.  Exam: General - Patient is Alert and Oriented Extremity - Neurologically intact Neurovascular intact Dorsiflexion/Plantar flexion intact Dressing - dressing C/D/I Motor Function - intact, moving foot and toes well on exam.   Past Medical History:  Diagnosis Date   Complication of anesthesia    Pt difficult to intubate; 07/19/03 aborted surgery unable to intubate   Coronary artery disease    Diabetes mellitus without complication ([place])    Dysthymic disorder    Gout    Hx: of in left wrist   Gout    Hearing deficit    Hx: of wears hearing aids   Hypertension    Kidney stones    Hx: of   Multiple myeloma ([place])    Occlusion and  stenosis of unspecified carotid artery    Pathologic fracture of vertebrae    Pneumonia    Rosacea    Sleep apnea    Hx: of pt wears CPAP    Assessment/Plan: 1 Day Post-Op Procedure(s) (LRB): TOTAL KNEE ARTHROPLASTY (Right) Principal Problem:   OA (osteoarthritis) of knee Active Problems:   Status post revision of total knee replacement, right  Estimated body mass index is 38.92 kg/m as calculated from the following:   Height as of this encounter: $RemoveBeforeD'5\' 5"'LbRulfAywremQL$  (1.651 m).   Weight as of this encounter: 106.1 kg. Up with therapy   Patient's anticipated LOS is less than 2 midnights, meeting these requirements: - Younger than 94 - Lives within 1 hour of care - Has a competent adult at home to recover with post-op recover - NO history of  - Chronic pain requiring opiods  - Diabetes  - Coronary Artery Disease  - Heart failure  - Heart attack  - Stroke  - DVT/VTE  - Cardiac arrhythmia  - Respiratory Failure/COPD  - Renal failure  - Anemia  - Advanced Liver disease     DVT Prophylaxis - Aspirin and TED hose Weight bearing as tolerated. Continue therapy.  Plan is to go Home after hospital stay.  Plan for two sessions with  PT this morning, and if meeting goals, will plan for discharge this afternoon.   Patient to follow up in two weeks with Dr. Wynelle Link in clinic.   The PDMP database was reviewed today prior to any opioid medications being prescribed to this patient.Fenton Foy, MBA, PA-C Orthopedic Surgery 03/13/2021, 9:49 AM

## 2021-03-13 NOTE — Progress Notes (Signed)
Patient discharged to home w/ family. Given all belongings, instructions, equipment. Verbalized understanding of instructions. Escorted to pov via w/c. 

## 2021-03-13 NOTE — TOC Transition Note (Signed)
Transition of Care [facility]) - CM/SW Discharge Note   Patient Details  Name: [name] [name] MRN: [id number] Date of Birth: Jun 02, 1932  [facility] of Care [facility]) CM/SW Contact:  Lennart Pall, LCSW Phone Number: 03/13/2021, 9:50 AM   Clinical Narrative:    Met with pt and confirming he has all needed DME at home.  Plan for OPPT at [facility].  No TOC needs.   Final next level of care: OP Rehab Barriers to Discharge: No Barriers Identified   Patient Goals and CMS Choice Patient states their goals for this hospitalization and ongoing recovery are:: return home      Discharge Placement                       Discharge Plan and Services                DME Arranged: N/A DME Agency: NA                  Social Determinants of Health (SDOH) Interventions     Readmission Risk Interventions No flowsheet data found.

## 2021-03-13 NOTE — Progress Notes (Signed)
Physical Therapy Treatment Patient Details Name: [name] [name] MRN: [id number] DOB: 1932/12/03 Today's Date: 03/13/2021   History of Present Illness 88 yo male s/p R TKA.PMH: discectomy L2-4, gout, MM, HTN, vertebral fx, CAD. CABG, R RCR    PT Comments    Pt continues to make excellent progress. Pt and wife able to return demo stair technique. Ready for d/c with family assist prn from PT standpoint.    Recommendations for follow up therapy are one component of a multi-disciplinary discharge planning process, led by the attending physician.  Recommendations may be updated based on patient status, additional functional criteria and insurance authorization.  Follow Up Recommendations  Follow physician's recommendations for discharge plan and follow up therapies     Assistance Recommended at Discharge Frequent or constant Supervision/Assistance  Patient can return home with the following Help with stairs or ramp for entrance;A little help with walking and/or transfers;A little help with bathing/dressing/bathroom;Assist for transportation;Assistance with cooking/housework   Equipment Recommendations  None recommended by PT    Recommendations for Other Services       Precautions / Restrictions Precautions Precautions: Fall;Knee Restrictions Weight Bearing Restrictions: No Other Position/Activity Restrictions: WBAT     Mobility  Bed Mobility Overal bed mobility: Needs Assistance Bed Mobility: Supine to Sit     Supine to sit: Min guard, Supervision     General bed mobility comments: for safety,  use of rail, bed flat (has adjustable bed at home)    Transfers Overall transfer level: Needs assistance Equipment used: Rolling walker (2 wheels) Transfers: Sit to/from Stand Sit to Stand: Min guard, Supervision           General transfer comment: cues for hand placement and RLE position; min/guard for safety from bed,  BSC    Ambulation/Gait Ambulation/Gait assistance:  Min guard, Supervision Gait Distance (Feet): 40 Feet (15') Assistive device: Rolling walker (2 wheels) Gait Pattern/deviations: Step-to pattern, Decreased stance time - right       General Gait Details: cues for sequence and RW  position   Stairs Stairs: Yes Stairs assistance: Min assist, Min guard Stair Management: Step to pattern, Backwards, No rails, With walker Number of Stairs: 1 (x4) General stair comments: cues for sequence and RW position; spouse present, able to return demo   Wheelchair Mobility    Modified Rankin (Stroke Patients Only)       Balance                                            Cognition Arousal/Alertness: Awake/alert Behavior During Therapy: WFL for tasks assessed/performed Overall Cognitive Status: Within Functional Limits for tasks assessed                                          Exercises Total Joint Exercises Ankle Circles/Pumps: AROM, Both, 10 reps Quad Sets: AROM, Both, 5 reps Heel Slides: AAROM, Right, 5 reps Straight Leg Raises: AROM, Right, 5 reps    General Comments        Pertinent Vitals/Pain Pain Assessment Pain Assessment: 0-10 Pain Score: 4  Pain Location: right knee Pain Descriptors / Indicators: Grimacing, Sore Pain Intervention(s): Limited activity within patient's tolerance, Monitored during session, Premedicated before session, Repositioned    Home Living  Prior Function            PT Goals (current goals can now be found in the care plan section) Acute Rehab PT Goals Patient Stated Goal: home when ready PT Goal Formulation: With patient Time For Goal Achievement: 03/19/21 Potential to Achieve Goals: Good Progress towards PT goals: Progressing toward goals    Frequency    7X/week      PT Plan Current plan remains appropriate    Co-evaluation              AM-PAC PT "6 Clicks" Mobility   Outcome Measure  Help needed  turning from your back to your side while in a flat bed without using bedrails?: None Help needed moving from lying on your back to sitting on the side of a flat bed without using bedrails?: None Help needed moving to and from a bed to a chair (including a wheelchair)?: A Little Help needed standing up from a chair using your arms (e.g., wheelchair or bedside chair)?: A Little Help needed to walk in hospital room?: A Little Help needed climbing 3-5 steps with a railing? : A Little 6 Click Score: 20    End of Session Equipment Utilized During Treatment: Gait belt Activity Tolerance: Patient tolerated treatment well Patient left: with call bell/phone within reach;with family/visitor present;in bed;with bed alarm set Nurse Communication: Mobility status PT Visit Diagnosis: Other abnormalities of gait and mobility (R26.89);Difficulty in walking, not elsewhere classified (R26.2)     Time: 0923-3007 PT Time Calculation (min) (ACUTE ONLY): 31 min  Charges:  $Gait Training: 23-37 mins                     Baxter Flattery, PT  Acute Rehab Dept ([place]) [phone] Pager [phone]  03/13/2021    [facility] 03/13/2021, 3:23 PM

## 2021-03-27 NOTE — Discharge Summary (Signed)
Physician Discharge Summary   Patient ID: [name] [name] MRN: [id number] DOB/AGE: [birth year] 88 y.o.  Admit date: 03/12/2021 Discharge date: 03/13/2021  Primary Diagnosis: OA Right Knee  Admission Diagnoses:  Past Medical History:  Diagnosis Date   Complication of anesthesia    Pt difficult to intubate; 07/19/03 aborted surgery unable to intubate   Coronary artery disease    Diabetes mellitus without complication ([place])    Dysthymic disorder    Gout    Hx: of in left wrist   Gout    Hearing deficit    Hx: of wears hearing aids   Hypertension    Kidney stones    Hx: of   Multiple myeloma (HCC)    Occlusion and stenosis of unspecified carotid artery    Pathologic fracture of vertebrae    Pneumonia    Rosacea    Sleep apnea    Hx: of pt wears CPAP   Discharge Diagnoses:   Principal Problem:   OA (osteoarthritis) of knee Active Problems:   Status post revision of total knee replacement, right  Estimated body mass index is 38.92 kg/m as calculated from the following:   Height as of this encounter: $RemoveBeforeD'5\' 5"'[id number]$  (1.651 m).   Weight as of this encounter: 106.1 kg.  Procedure:  Procedure(s) (LRB): TOTAL KNEE ARTHROPLASTY (Right)   Consults: None  HPI:   [name] [name] is a 88 y.o. year old male with end stage OA of his right knee with progressively worsening pain and dysfunction. He has constant pain, with activity and at rest and significant functional deficits with difficulties even with ADLs. He has had extensive non-op management including analgesics, injections of cortisone and viscosupplements, and home exercise program, but remains in significant pain with significant dysfunction. Radiographs show bone on bone arthritis medial and patellofemoral. He presents now for right Total Knee Arthroplasty.    Laboratory Data: Admission on 03/12/2021, Discharged on 03/13/2021  Component Date Value Ref Range Status   Glucose-Capillary 03/12/2021 114 (H)  70 - 99 mg/dL Final   Glucose  reference range applies only to samples taken after fasting for at least 8 hours.   Glucose-Capillary 03/12/2021 118 (H)  70 - 99 mg/dL Final   Glucose reference range applies only to samples taken after fasting for at least 8 hours.   WBC 03/13/2021 11.5 (H)  4.0 - 10.5 K/uL Final   RBC 03/13/2021 3.23 (L)  4.22 - 5.81 MIL/uL Final   Hemoglobin 03/13/2021 10.9 (L)  13.0 - 17.0 g/dL Final   HCT 03/13/2021 31.8 (L)  39.0 - 52.0 % Final   MCV 03/13/2021 98.5  80.0 - 100.0 fL Final   MCH 03/13/2021 33.7  26.0 - 34.0 pg Final   MCHC 03/13/2021 34.3  30.0 - 36.0 g/dL Final   RDW 03/13/2021 14.6  11.5 - 15.5 % Final   Platelets 03/13/2021 124 (L)  150 - 400 K/uL Final   nRBC 03/13/2021 0.0  0.0 - 0.2 % Final   Performed at [facility], [place] [address] [place], [place] [address]   Sodium 03/13/2021 134 (L)  135 - 145 mmol/L Final   Potassium 03/13/2021 5.6 (H)  3.5 - 5.1 mmol/L Final   Chloride 03/13/2021 104  98 - 111 mmol/L Final   CO2 03/13/2021 21 (L)  22 - 32 mmol/L Final   Glucose, Bld 03/13/2021 147 (H)  70 - 99 mg/dL Final   Glucose reference range applies only to samples taken after fasting for at  least 8 hours.   BUN 03/13/2021 30 (H)  8 - 23 mg/dL Final   Creatinine, Ser 03/13/2021 1.07  0.61 - 1.24 mg/dL Final   Calcium 03/13/2021 9.1  8.9 - 10.3 mg/dL Final   GFR, Estimated 03/13/2021 >60  >60 mL/min Final   Comment: (NOTE) Calculated using the CKD-EPI Creatinine Equation (2021)    Anion gap 03/13/2021 9  5 - 15 Final   Performed at [facility], [place] [address] [place], [place] [address]  Hospital Outpatient Visit on 03/08/2021  Component Date Value Ref Range Status   SARS Coronavirus 2 03/08/2021 NEGATIVE  NEGATIVE Final   Comment: (NOTE) SARS-CoV-2 target nucleic acids are NOT DETECTED.  The SARS-CoV-2 RNA is generally detectable in upper and lower respiratory specimens during the acute phase of infection. Negative results do not  preclude SARS-CoV-2 infection, do not rule out co-infections with other pathogens, and should not be used as the sole basis for treatment or other patient management decisions. Negative results must be combined with clinical observations, patient history, and epidemiological information. The expected result is Negative.  Fact Sheet for Patients: SugarRoll.be  Fact Sheet for Healthcare Providers: https://www.woods-mathews.com/  This test is not yet approved or cleared by the [place] FDA and  has been authorized for detection and/or diagnosis of SARS-CoV-2 by FDA under an Emergency Use Authorization (EUA). This EUA will remain  in effect (meaning this test can be used) for the duration of the COVID-19 declaration under Se                          ction 564(b)(1) of the Act, 21 U.S.C. section 360bbb-3(b)(1), unless the authorization is terminated or revoked sooner.  Performed at [facility], [place] [address], [place], [place] [address]   Hospital Outpatient Visit on 02/28/2021  Component Date Value Ref Range Status   MRSA, PCR 02/28/2021 NEGATIVE  NEGATIVE Final   Staphylococcus aureus 02/28/2021 POSITIVE (A)  NEGATIVE Final   Comment: (NOTE) The Xpert SA Assay (FDA approved for NASAL specimens in patients 102 years of age and older), is one component of a comprehensive surveillance program. It is not intended to diagnose infection nor to guide or monitor treatment. Performed at [facility], [place] [address] [place], [place] [address]    WBC 02/28/2021 6.0  4.0 - 10.5 K/uL Final   RBC 02/28/2021 3.76 (L)  4.22 - 5.81 MIL/uL Final   Hemoglobin 02/28/2021 12.7 (L)  13.0 - 17.0 g/dL Final   HCT 02/28/2021 37.1 (L)  39.0 - 52.0 % Final   MCV 02/28/2021 98.7  80.0 - 100.0 fL Final   MCH 02/28/2021 33.8  26.0 - 34.0 pg Final   MCHC 02/28/2021 34.2  30.0 - 36.0 g/dL Final   RDW 02/28/2021 14.9  11.5 - 15.5 % Final    Platelets 02/28/2021 144 (L)  150 - 400 K/uL Final   nRBC 02/28/2021 0.0  0.0 - 0.2 % Final   Performed at [facility], [place] [address] [place], [place] [address]   Sodium 02/28/2021 136  135 - 145 mmol/L Final   Potassium 02/28/2021 4.8  3.5 - 5.1 mmol/L Final   Chloride 02/28/2021 105  98 - 111 mmol/L Final   CO2 02/28/2021 26  22 - 32 mmol/L Final   Glucose, Bld 02/28/2021 113 (H)  70 - 99 mg/dL Final   Glucose reference range applies only to samples taken after  fasting for at least 8 hours.   BUN 02/28/2021 27 (H)  8 - 23 mg/dL Final   Creatinine, Ser 02/28/2021 1.06  0.61 - 1.24 mg/dL Final   Calcium 02/28/2021 9.6  8.9 - 10.3 mg/dL Final   Total Protein 02/28/2021 7.3  6.5 - 8.1 g/dL Final   Albumin 02/28/2021 3.9  3.5 - 5.0 g/dL Final   AST 02/28/2021 24  15 - 41 U/L Final   ALT 02/28/2021 19  0 - 44 U/L Final   Alkaline Phosphatase 02/28/2021 70  38 - 126 U/L Final   Total Bilirubin 02/28/2021 1.0  0.3 - 1.2 mg/dL Final   GFR, Estimated 02/28/2021 >60  >60 mL/min Final   Comment: (NOTE) Calculated using the CKD-EPI Creatinine Equation (2021)    Anion gap 02/28/2021 5  5 - 15 Final   Performed at [facility], [place] [address] [place], [place] [address]   Prothrombin Time 02/28/2021 13.0  11.4 - 15.2 seconds Final   INR 02/28/2021 1.0  0.8 - 1.2 Final   Comment: (NOTE) INR goal varies based on device and disease states. Performed at [facility], [place] [address], [place],  [address]    Hgb A1c MFr Bld 02/28/2021 6.3 (H)  4.8 - 5.6 % Final   Comment: (NOTE)         Prediabetes: 5.7 - 6.4         Diabetes: >6.4         Glycemic control for adults with diabetes: <7.0    Mean Plasma Glucose 02/28/2021 134  mg/dL Final   Comment: (NOTE) Performed At: [facility] [place], [place] [id number] Rush Farmer MD TO:[id number]    Glucose-Capillary 02/28/2021 121 (H)  70 - 99 mg/dL Final    Glucose reference range applies only to samples taken after fasting for at least 8 hours.     X-Rays:No results found.  EKG: Orders placed or performed in visit on 10/12/20   EKG 12-Lead     Hospital Course: JANCARLOS THRUN is a 88 y.o. who was admitted to [facility]. They were brought to the operating room on 03/12/2021 and underwent Procedure(s): TOTAL KNEE ARTHROPLASTY.  Patient tolerated the procedure well and was later transferred to the recovery room and then to the orthopaedic floor for postoperative care. They were given PO and IV analgesics for pain control following their surgery. They were given 24 hours of postoperative antibiotics of  Anti-infectives (From admission, onward)    Start     Dose/Rate Route Frequency Ordered Stop   03/12/21 1530  ceFAZolin (ANCEF) IVPB 2g/100 mL premix        2 g 200 mL/hr over 30 Minutes Intravenous Every 6 hours 03/12/21 1236 03/12/21 2302   03/12/21 0700  ceFAZolin (ANCEF) IVPB 2g/100 mL premix        2 g 200 mL/hr over 30 Minutes Intravenous On call to O.R. 03/12/21 [id number] 03/12/21 0949      and started on DVT prophylaxis in the form of Aspirin and TED hose.   PT and OT were ordered for total joint protocol. Discharge planning consulted to help with postop disposition and equipment needs.  Patient had an uneve night on the evening of surgery. They started to get up OOB with therapy on POD#1. Pt was seen during rounds and was ready to go home pending progress with therapy. He worked with therapy on POD #1 and was meeting  goals. Pt was  discharged to home later that day in stable condition.  Diet: Regular diet Activity: WBAT Follow-up: in 2 weeks Disposition: Home Discharged Condition: good   Discharge Instructions     Call MD / Call 911   Complete by: As directed    If you experience chest pain or shortness of breath, CALL 911 and be transported to the hospital emergency room.  If you develope a fever above 101 F, pus (white  drainage) or increased drainage or redness at the wound, or calf pain, call your surgeon's office.   Change dressing   Complete by: As directed    You may remove the bulky bandage (ACE wrap and gauze) two days after surgery. You will have an adhesive waterproof bandage underneath. Leave this in place until your first follow-up appointment.   Constipation Prevention   Complete by: As directed    Drink plenty of fluids.  Prune juice may be helpful.  You may use a stool softener, such as Colace (over the counter) 100 mg twice a day.  Use MiraLax (over the counter) for constipation as needed.   Diet - low sodium heart healthy   Complete by: As directed    Do not put a pillow under the knee. Place it under the heel.   Complete by: As directed    Driving restrictions   Complete by: As directed    No driving for two weeks   Post-operative opioid taper instructions:   Complete by: As directed    POST-OPERATIVE OPIOID TAPER INSTRUCTIONS: It is important to wean off of your opioid medication as soon as possible. If you do not need pain medication after your surgery it is ok to stop day one. Opioids include: Codeine, Hydrocodone(Norco, Vicodin), Oxycodone(Percocet, oxycontin) and hydromorphone amongst others.  Long term and even short term use of opiods can cause: Increased pain response Dependence Constipation Depression Respiratory depression And more.  Withdrawal symptoms can include Flu like symptoms Nausea, vomiting And more Techniques to manage these symptoms Hydrate well Eat regular healthy meals Stay active Use relaxation techniques(deep breathing, meditating, yoga) Do Not substitute Alcohol to help with tapering If you have been on opioids for less than two weeks and do not have pain than it is ok to stop all together.  Plan to wean off of opioids This plan should start within one week post op of your joint replacement. Maintain the same interval or time between taking each dose  and first decrease the dose.  Cut the total daily intake of opioids by one tablet each day Next start to increase the time between doses. The last dose that should be eliminated is the evening dose.      TED hose   Complete by: As directed    Use stockings (TED hose) for three weeks on both leg(s).  You may remove them at night for sleeping.   Weight bearing as tolerated   Complete by: As directed       Allergies as of 03/13/2021       Reactions   Amlodipine Swelling   Ciprofloxacin Nausea Only   Hydralazine Other (See Comments)   Leg edema   Hydrocodone Nausea Only   Ok taking in low doses($RemoveBefo'5mg'NNIECHzwJbT$ ), has nausea with higher doses   Levofloxacin Other (See Comments)   Pain/cramping   Oxycodone Nausea Only   Tramadol Nausea Only   Cardizem [diltiazem Hcl] Rash        Medication List     TAKE these medications  acetaminophen 650 MG CR tablet Commonly known as: TYLENOL Take 650 mg by mouth every 8 (eight) hours as needed for pain.   allopurinol 300 MG tablet Commonly known as: ZYLOPRIM Take 300 mg by mouth daily.   aspirin 325 MG EC tablet Take 1 tablet (325 mg total) by mouth 2 (two) times daily. Then take one 81 mg aspirin once a day for three weeks. Then discontinue aspirin. What changed:  medication strength how much to take when to take this additional instructions   cetirizine 10 MG tablet Commonly known as: ZYRTEC Take 10 mg by mouth daily as needed for allergies.   cromolyn 4 % ophthalmic solution Commonly known as: OPTICROM Place 1 drop into both eyes in the morning and at bedtime.   docusate sodium 100 MG capsule Commonly known as: COLACE Take 100 mg by mouth daily as needed for moderate constipation.   fluticasone 50 MCG/ACT nasal spray Commonly known as: FLONASE Place 1 spray into both nostrils daily as needed for allergies.   gabapentin 300 MG capsule Commonly known as: NEURONTIN Take a 300 mg capsule three times a day for two weeks  following surgery.Then take a 300 mg capsule two times a day for two weeks. Then take a 300 mg capsule once a day for two weeks. Then discontinue. What changed:  medication strength how much to take how to take this when to take this additional instructions   hydrochlorothiazide 25 MG tablet Commonly known as: HYDRODIURIL TAKE 1 TABLET BY MOUTH EVERY DAY   HYDROmorphone 2 MG tablet Commonly known as: DILAUDID Take 1-2 tablets (2-4 mg total) by mouth every 6 (six) hours as needed for severe pain.   isosorbide dinitrate 30 MG tablet Commonly known as: ISORDIL TAKE 1 TABLET BY MOUTH THREE TIMES A DAY   labetalol 200 MG tablet Commonly known as: NORMODYNE TAKE 1 TABLET BY MOUTH TWICE A DAY   levothyroxine 100 MCG tablet Commonly known as: SYNTHROID Take 100 mcg by mouth daily before breakfast.   losartan 100 MG tablet Commonly known as: COZAAR TAKE 1 TABLET BY MOUTH EVERY DAY What changed: when to take this   methocarbamol 500 MG tablet Commonly known as: ROBAXIN Take 1 tablet (500 mg total) by mouth every 6 (six) hours as needed for muscle spasms.   multivitamin tablet Take 1 tablet by mouth daily.   nitroGLYCERIN 0.4 MG SL tablet Commonly known as: Nitrostat Place 1 tablet (0.4 mg total) under the tongue every 5 (five) minutes as needed for chest pain. If your symptoms continue after taking two tablets 5 minutes apart call 911.   PRESERVISION AREDS PO Take 1 capsule by mouth in the morning and at bedtime.   rosuvastatin 20 MG tablet Commonly known as: CRESTOR Take 20 mg by mouth at bedtime.   spironolactone 25 MG tablet Commonly known as: ALDACTONE TAKE 1 TABLET (25 MG TOTAL) BY MOUTH IN THE MORNING.   SYSTANE OP Place 1 drop into both eyes daily as needed (dry eyes).   Vitamin D (Ergocalciferol) 1.25 MG (50000 UNIT) Caps capsule Commonly known as: DRISDOL Take 50,000 Units by mouth every Saturday.               Discharge Care Instructions  (From  admission, onward)           Start     Ordered   03/13/21 0000  Weight bearing as tolerated        03/13/21 0954   03/13/21 0000  Change dressing  Comments: You may remove the bulky bandage (ACE wrap and gauze) two days after surgery. You will have an adhesive waterproof bandage underneath. Leave this in place until your first follow-up appointment.   03/13/21 0954            Follow-up Information     Gaynelle Arabian, MD. Schedule an appointment as soon as possible for a visit in 2 week(s).   Specialty: Orthopedic Surgery Contact information: [address] [place] [address] [phone]                 Signed: Fenton Foy, PA-C Orthopedic Surgery 03/27/2021, 2:52 PM

## 2021-04-12 ENCOUNTER — Ambulatory Visit: Payer: Medicare PPO | Admitting: Cardiology

## 2021-04-12 ENCOUNTER — Other Ambulatory Visit: Payer: Self-pay

## 2021-04-12 ENCOUNTER — Encounter: Payer: Self-pay | Admitting: Cardiology

## 2021-04-12 ENCOUNTER — Other Ambulatory Visit: Payer: Self-pay | Admitting: Cardiology

## 2021-04-12 VITALS — BP 119/60 | HR 74 | Temp 97.8°F | Resp 16 | Ht 65.0 in | Wt 233.4 lb

## 2021-04-12 MED ORDER — GABAPENTIN 100 MG PO CAPS: 100.0000 mg | ORAL_CAPSULE | Freq: Three times a day (TID) | ORAL | 1 refills | Status: DC

## 2021-04-12 NOTE — Progress Notes (Signed)
? ?Primary Physician/Referring:  No primary care provider on file. ? ?Patient ID: [name] [name], male    DOB: 10/23/1932, 88 y.o.   MRN: [id number] ? ?Chief Complaint  ?Patient presents with  ? Coronary Artery Disease  ? Hypertension  ? Hyperlipidemia  ? Follow-up  ?  6 months  ? ?HPI:   ? ?[name] [name]  is a 88 y.o. Caucasian male with coronary disease with CABG in 2012, hypertension, mixed hyperlipidemia, hyperglycemia, h/o gout, OSA on CPAP, former smoker, morbid obesity, multiple myeloma in regression who presents to the office for 86-monthfollow-up of hypertension, hyperlipidemia and coronary artery disease. He underwent right knee replacement on 03/12/2021, he still recuperating from this and still has edema and pain.  No periprocedural complications from cardiac standpoint. ? ?He has not had any further chest pain, dyspnea has remained stable, no pedal edema, no PND or orthopnea.  He was recently evaluated by Dr. GGunnar BullaMagrinat and told to have complete regression of multiple myeloma and recommended annual SPEP evaluation only. ? ?Past Medical History:  ?Diagnosis Date  ? Complication of anesthesia   ? Pt difficult to intubate; 07/19/03 aborted surgery unable to intubate  ? Coronary artery disease   ? Diabetes mellitus without complication ([place]   ? Dysthymic disorder   ? Gout   ? Hx: of in left wrist  ? Gout   ? Hearing deficit   ? Hx: of wears hearing aids  ? Hypertension   ? Kidney stones   ? Hx: of  ? Multiple myeloma ([place]   ? Occlusion and stenosis of unspecified carotid artery   ? Pathologic fracture of vertebrae   ? Pneumonia   ? Rosacea   ? Sleep apnea   ? Hx: of pt wears CPAP  ? ?Social History  ? ?Tobacco Use  ? Smoking status: Former  ?  Packs/day: 0.50  ?  Years: 30.00  ?  Pack years: 15.00  ?  Types: Cigarettes  ?  Quit date: 04/08/1978  ?  Years since quitting: 43.0  ? Smokeless tobacco: Never  ? Tobacco comments:  ?  Quit smoking cigarretes in 1980  ?Substance Use Topics  ? Alcohol use: No  ?   Alcohol/week: 0.0 standard drinks  ? ?Marital Status: Married  ?ROS  ?Review of Systems  ?Constitutional: Weight loss: \.  ?Cardiovascular:  Negative for chest pain, claudication, dyspnea on exertion and syncope.  ?Respiratory:  Positive for snoring.   ?Gastrointestinal:  Negative for melena.  ?Objective  ?Blood pressure 119/60, pulse 74, temperature 97.8 ?F (36.6 ?C), temperature source Temporal, resp. rate 16, height 5' 5" (1.651 m), weight 233 lb 6.4 oz (105.9 kg), SpO2 95 %.  ?Vitals with BMI 86/16/2023 03/13/2021 03/13/2021  ?Height 5' 5" - -  ?Weight 233 lbs 6 oz - -  ?BMI 38.84 - -  ?Systolic [id number] ?Diastolic 60 47 56  ?Pulse 74 66 77  ?  ? Physical Exam ?Constitutional:   ?   Comments: Morbidly Obese  ?Neck:  ?   Vascular: No carotid bruit or JVD.  ?   Comments: Short neck difficult to evaluate JVP ?Cardiovascular:  ?   Rate and Rhythm: Normal rate and regular rhythm.  ?   Pulses: Normal pulses and intact distal pulses.     ?     Dorsalis pedis pulses are 2+ on the right side and 2+ on the left side.  ?     Posterior tibial pulses are 2+  on the right side and 2+ on the left side.  ?   Heart sounds: Normal heart sounds. No murmur heard. ?  No gallop.  ?Pulmonary:  ?   Effort: Pulmonary effort is normal. No accessory muscle usage or respiratory distress.  ?   Breath sounds: Normal breath sounds.  ?Abdominal:  ?   General: Bowel sounds are normal.  ?   Palpations: Abdomen is soft.  ?   Comments: Obese. Pannus present   ?Musculoskeletal:  ?   Right lower leg: No edema.  ?   Left lower leg: No edema.  ?Skin: ?   Capillary Refill: Capillary refill takes less than 2 seconds.  ? ?Laboratory examination:  ? ?Recent Labs  ?  10/20/20 ?0905 02/28/21 ?1203 03/13/21 ?7619  ?NA 141 136 134*  ?K 5.0 4.8 5.6*  ?CL 103 105 104  ?CO2 23 26 21*  ?GLUCOSE 111* 113* 147*  ?BUN 26 27* 30*  ?CREATININE 1.14 1.06 1.07  ?CALCIUM 10.5* 9.6 9.1  ?GFRNONAA  --  >60 >60  ? ?CrCl cannot be calculated (Patient's most recent lab result  is older than the maximum 21 days allowed.).  ?CMP Latest Ref Rng & Units 03/13/2021 02/28/2021 10/20/2020  ?Glucose 70 - 99 mg/dL 147(H) 113(H) 111(H)  ?BUN 8 - 23 mg/dL 30(H) 27(H) 26  ?Creatinine 0.61 - 1.24 mg/dL 1.07 1.06 1.14  ?Sodium 135 - 145 mmol/L 134(L) 136 141  ?Potassium 3.5 - 5.1 mmol/L 5.6(H) 4.8 5.0  ?Chloride 98 - 111 mmol/L 104 105 103  ?CO2 22 - 32 mmol/L 21(L) 26 23  ?Calcium 8.9 - 10.3 mg/dL 9.1 9.6 10.5(H)  ?Total Protein 6.5 - 8.1 g/dL - 7.3 6.9  ?Total Bilirubin 0.3 - 1.2 mg/dL - 1.0 0.6  ?Alkaline Phos 38 - 126 U/L - 70 82  ?AST 15 - 41 U/L - 24 24  ?ALT 0 - 44 U/L - 19 16  ? ?CBC Latest Ref Rng & Units 03/13/2021 02/28/2021 10/20/2020  ?WBC 4.0 - 10.5 K/uL 11.5(H) 6.0 6.6  ?Hemoglobin 13.0 - 17.0 g/dL 10.9(L) 12.7(L) 13.1  ?Hematocrit 39.0 - 52.0 % 31.8(L) 37.1(L) 38.9  ?Platelets 150 - 400 K/uL 124(L) 144(L) 159  ? ?BNP (last 3 results) ?No results for input(s): BNP in the last 8760 hours. ? ?Lipid Panel ?Recent Labs  ?  04/14/20 ?0901 10/20/20 ?5093  ?CHOL 102 108  ?TRIG 144 138  ?LDLCALC 46 50  ?HDL 31* 34*  ? ?Medications and allergies  ? ?Allergies  ?Allergen Reactions  ? Amlodipine Swelling  ? Ciprofloxacin Nausea Only  ? Hydralazine Other (See Comments)  ?  Leg edema  ? Hydrocodone Nausea Only  ?  Ok taking in low doses(16m), has nausea with higher doses  ? Levofloxacin Other (See Comments)  ?  Pain/cramping ?  ? Oxycodone Nausea Only  ? Tramadol Nausea Only  ? Cardizem [Diltiazem Hcl] Rash  ?  ? ?Current Outpatient Medications:  ?  acetaminophen (TYLENOL) 650 MG CR tablet, Take 650 mg by mouth every 8 (eight) hours as needed for pain., Disp: , Rfl:  ?  allopurinol (ZYLOPRIM) 300 MG tablet, Take 300 mg by mouth daily., Disp: , Rfl:  ?  aspirin EC 81 MG tablet, Take 81 mg by mouth daily. Swallow whole., Disp: , Rfl:  ?  cetirizine (ZYRTEC) 10 MG tablet, Take 10 mg by mouth daily as needed for allergies., Disp: , Rfl:  ?  cromolyn (OPTICROM) 4 % ophthalmic solution, Place 1 drop into both eyes  in the morning and at bedtime., Disp: , Rfl: 2 ?  docusate sodium (COLACE) 100 MG capsule, Take 100 mg by mouth daily as needed for moderate constipation., Disp: , Rfl:  ?  Ergocalciferol (VITAMIN D2 PO), Take 1,250 mcg by mouth once a week., Disp: , Rfl:  ?  fluticasone (FLONASE) 50 MCG/ACT nasal spray, Place 1 spray into both nostrils daily as needed for allergies., Disp: , Rfl:  ?  hydrochlorothiazide (HYDRODIURIL) 25 MG tablet, TAKE 1 TABLET BY MOUTH EVERY DAY, Disp: 90 tablet, Rfl: 1 ?  HYDROmorphone (DILAUDID) 2 MG tablet, Take 1-2 tablets (2-4 mg total) by mouth every 6 (six) hours as needed for severe pain., Disp: 42 tablet, Rfl: 0 ?  isosorbide dinitrate (ISORDIL) 30 MG tablet, TAKE 1 TABLET BY MOUTH THREE TIMES A DAY, Disp: 90 tablet, Rfl: 1 ?  labetalol (NORMODYNE) 200 MG tablet, TAKE 1 TABLET BY MOUTH TWICE A DAY, Disp: 60 tablet, Rfl: 3 ?  levothyroxine (SYNTHROID, LEVOTHROID) 100 MCG tablet, Take 100 mcg by mouth daily before breakfast., Disp: , Rfl:  ?  losartan (COZAAR) 100 MG tablet, TAKE 1 TABLET BY MOUTH EVERY DAY, Disp: 90 tablet, Rfl: 1 ?  methocarbamol (ROBAXIN) 500 MG tablet, Take 1 tablet (500 mg total) by mouth every 6 (six) hours as needed for muscle spasms., Disp: 40 tablet, Rfl: 0 ?  Multiple Vitamin (MULTIVITAMIN) tablet, Take 1 tablet by mouth daily., Disp: , Rfl:  ?  Multiple Vitamins-Minerals (PRESERVISION AREDS PO), Take 1 capsule by mouth in the morning and at bedtime., Disp: , Rfl:  ?  nitroGLYCERIN (NITROSTAT) 0.4 MG SL tablet, Place 1 tablet (0.4 mg total) under the tongue every 5 (five) minutes as needed for chest pain. If your symptoms continue after taking two tablets 5 minutes apart call 911., Disp: 90 tablet, Rfl: 3 ?  Omega-3 Fatty Acids (FISH OIL) 1000 MG CAPS, Take 1 capsule by mouth daily at 12 noon., Disp: , Rfl:  ?  Polyethyl Glycol-Propyl Glycol (SYSTANE OP), Place 1 drop into both eyes daily as needed (dry eyes)., Disp: , Rfl:  ?  rosuvastatin (CRESTOR) 20 MG tablet,  Take 20 mg by mouth at bedtime., Disp: , Rfl:  ?  spironolactone (ALDACTONE) 25 MG tablet, TAKE 1 TABLET (25 MG TOTAL) BY MOUTH IN THE MORNING., Disp: 90 tablet, Rfl: 2 ?  gabapentin (NEURONTIN) 100

## 2021-04-13 LAB — BASIC METABOLIC PANEL
BUN/Creatinine Ratio: 25 — ABNORMAL HIGH (ref 10–24)
BUN: 30 mg/dL — ABNORMAL HIGH (ref 8–27)
CO2: 22 mmol/L (ref 20–29)
Calcium: 9.7 mg/dL (ref 8.6–10.2)
Chloride: 105 mmol/L (ref 96–106)
Creatinine, Ser: 1.18 mg/dL (ref 0.76–1.27)
Glucose: 119 mg/dL — ABNORMAL HIGH (ref 70–99)
Potassium: 5.2 mmol/L (ref 3.5–5.2)
Sodium: 139 mmol/L (ref 134–144)
eGFR: 60 mL/min/{1.73_m2} (ref >59)

## 2021-04-13 LAB — CBC
Hematocrit: 28.8 % — ABNORMAL LOW (ref 37.5–51.0)
Hemoglobin: 9.7 g/dL — ABNORMAL LOW (ref 13.0–17.7)
MCH: 33 pg (ref 26.6–33.0)
MCHC: 33.7 g/dL (ref 31.5–35.7)
MCV: 98 fL — ABNORMAL HIGH (ref 79–97)
Platelets: 205 10*3/uL (ref 150–450)
RBC: 2.94 x10E6/uL — ABNORMAL LOW (ref 4.14–5.80)
RDW: 13.8 % (ref 11.6–15.4)
WBC: 7 10*3/uL (ref 3.4–10.8)

## 2021-04-16 LAB — PROTEIN ELECTROPHORESIS, SERUM
A/G Ratio: 1 (ref 0.7–1.7)
Albumin ELP: 3.2 g/dL (ref 2.9–4.4)
Alpha 1: 0.3 g/dL (ref 0.0–0.4)
Alpha 2: 0.9 g/dL (ref 0.4–1.0)
Beta: 0.8 g/dL (ref 0.7–1.3)
Gamma Globulin: 1.2 g/dL (ref 0.4–1.8)
Globulin, Total: 3.1 g/dL (ref 2.2–3.9)
M-Spike, %: 0.8 g/dL — ABNORMAL HIGH
Total Protein: 6.3 g/dL (ref 6.0–8.5)

## 2021-05-07 ENCOUNTER — Other Ambulatory Visit: Payer: Self-pay | Admitting: Cardiology

## 2021-05-07 DIAGNOSIS — I10 Essential (primary) hypertension: Secondary | ICD-10-CM

## 2021-05-07 DIAGNOSIS — I2510 Atherosclerotic heart disease of native coronary artery without angina pectoris: Secondary | ICD-10-CM

## 2021-05-30 ENCOUNTER — Other Ambulatory Visit: Payer: Self-pay | Admitting: Cardiology

## 2021-05-30 DIAGNOSIS — I10 Essential (primary) hypertension: Secondary | ICD-10-CM

## 2021-06-08 ENCOUNTER — Other Ambulatory Visit: Payer: Self-pay | Admitting: Cardiology

## 2021-06-08 DIAGNOSIS — I2510 Atherosclerotic heart disease of native coronary artery without angina pectoris: Secondary | ICD-10-CM

## 2021-06-08 DIAGNOSIS — I10 Essential (primary) hypertension: Secondary | ICD-10-CM

## 2021-06-26 ENCOUNTER — Encounter: Payer: Self-pay | Admitting: Cardiology

## 2021-06-28 ENCOUNTER — Other Ambulatory Visit: Payer: Self-pay

## 2021-07-05 ENCOUNTER — Other Ambulatory Visit: Payer: Self-pay | Admitting: Cardiology

## 2021-07-05 DIAGNOSIS — I10 Essential (primary) hypertension: Secondary | ICD-10-CM

## 2021-07-05 DIAGNOSIS — I2510 Atherosclerotic heart disease of native coronary artery without angina pectoris: Secondary | ICD-10-CM

## 2021-07-13 ENCOUNTER — Encounter: Payer: Self-pay | Admitting: Nurse Practitioner

## 2021-07-13 ENCOUNTER — Ambulatory Visit: Payer: Medicare PPO | Admitting: Nurse Practitioner

## 2021-07-13 VITALS — BP 110/50 | HR 61 | Temp 97.2°F | Ht 65.5 in | Wt 236.4 lb

## 2021-07-13 DIAGNOSIS — I2510 Atherosclerotic heart disease of native coronary artery without angina pectoris: Secondary | ICD-10-CM

## 2021-07-13 DIAGNOSIS — Z9989 Dependence on other enabling machines and devices: Secondary | ICD-10-CM

## 2021-07-13 DIAGNOSIS — E291 Testicular hypofunction: Secondary | ICD-10-CM | POA: Diagnosis not present

## 2021-07-13 DIAGNOSIS — Z8739 Personal history of other diseases of the musculoskeletal system and connective tissue: Secondary | ICD-10-CM

## 2021-07-13 DIAGNOSIS — E119 Type 2 diabetes mellitus without complications: Secondary | ICD-10-CM

## 2021-07-13 DIAGNOSIS — G4733 Obstructive sleep apnea (adult) (pediatric): Secondary | ICD-10-CM

## 2021-07-13 DIAGNOSIS — M170 Bilateral primary osteoarthritis of knee: Secondary | ICD-10-CM

## 2021-07-13 DIAGNOSIS — D472 Monoclonal gammopathy: Secondary | ICD-10-CM

## 2021-07-13 LAB — COMPREHENSIVE METABOLIC PANEL
ALT: 15 U/L (ref 0–53)
AST: 21 U/L (ref 0–37)
Albumin: 4 g/dL (ref 3.5–5.2)
Alkaline Phosphatase: 64 U/L (ref 39–117)
BUN: 34 mg/dL — ABNORMAL HIGH (ref 6–23)
CO2: 27 mEq/L (ref 19–32)
Calcium: 10 mg/dL (ref 8.4–10.5)
Chloride: 107 mEq/L (ref 96–112)
Creatinine, Ser: 1.35 mg/dL (ref 0.40–1.50)
GFR: 47.51 mL/min — ABNORMAL LOW (ref >60.00)
Glucose, Bld: 126 mg/dL — ABNORMAL HIGH (ref 70–99)
Potassium: 4.8 mEq/L (ref 3.5–5.1)
Sodium: 139 mEq/L (ref 135–145)
Total Bilirubin: 0.6 mg/dL (ref 0.2–1.2)
Total Protein: 6.9 g/dL (ref 6.0–8.3)

## 2021-07-13 LAB — CBC
HCT: 34.8 % — ABNORMAL LOW (ref 39.0–52.0)
Hemoglobin: 11.6 g/dL — ABNORMAL LOW (ref 13.0–17.0)
MCHC: 33.4 g/dL (ref 30.0–36.0)
MCV: 97.4 fl (ref 78.0–100.0)
Platelets: 128 10*3/uL — ABNORMAL LOW (ref 150.0–400.0)
RBC: 3.57 Mil/uL — ABNORMAL LOW (ref 4.22–5.81)
RDW: 16.5 % — ABNORMAL HIGH (ref 11.5–15.5)
WBC: 4.5 10*3/uL (ref 4.0–10.5)

## 2021-07-13 LAB — URIC ACID: Uric Acid, Serum: 6 mg/dL (ref 4.0–7.8)

## 2021-07-13 LAB — HEMOGLOBIN A1C: Hgb A1c MFr Bld: 6.3 % (ref 4.6–6.5)

## 2021-07-13 MED ORDER — TESTOSTERONE CYPIONATE 200 MG/ML IM SOLN
200.0000 mg | Freq: Once | INTRAMUSCULAR | Status: AC
  Administered 2021-07-13: 200 mg via INTRAMUSCULAR

## 2021-07-13 MED ORDER — ALLOPURINOL 300 MG PO TABS: 300.0000 mg | ORAL_TABLET | Freq: Every day | ORAL | 1 refills | Status: DC

## 2021-07-13 NOTE — Assessment & Plan Note (Signed)
Was followed by Dr young. Patient states that he uses the CPAP nightly

## 2021-07-13 NOTE — Assessment & Plan Note (Signed)
Followed by Dr Moshe Salisbury every 6 months

## 2021-07-13 NOTE — Assessment & Plan Note (Signed)
States he injects '200mg'$  once every 3 month. Administered '200mg'$  IM in office times one dose. Next office visit will check testosterone levels. Previous CBC had lower hemoglobin

## 2021-07-13 NOTE — Assessment & Plan Note (Signed)
Was followed by Heme/Onc but now managed by Dr Moshe Salisbury.

## 2021-07-13 NOTE — Patient Instructions (Signed)
Nice to see you today I want to see you in about 3 months to check back in on you Follow up sooner if you need me

## 2021-07-13 NOTE — Progress Notes (Signed)
New Patient Office Visit  Subjective    Patient ID: [name] [name], male    DOB: 02-04-1932  Age: 88 y.o. MRN: [id number]  CC:  Chief Complaint  Patient presents with   Establish Care    Np est care c/o of red spot on rt leg and rt foot Need rx fill on Allopurinol     HPI [name] [name] presents to establish care  OSA: CPAP at night. Dr young was the pulmonologist he was seen.  Not currently being followed by him  MGUS: was evlauted by hematology/oncology.  States that physician retired and is being managed by Dr. Nadyne Coombes currently   Gout: Allopuronol. 321m. Has had gout in the left wrist. States that it was worse when he was wearing his watch does seem to have helped some since being placed on the medication from his previous provider Dr. LRex Krasrequesting refill today.  Hypogonadism: once every 2-3 months.  He will get 200 mg of testosterone.  Was prescribed by previous PCP a little.  Last injection was at least 3 months ago per patient report and administered in Dr. GIrven Shellingoffice.  We will administer today with patient's medication  Sees orthopedist Dr AKristie Cowman Gabapentin: post knee surgery.  They mention coming off medication currently maintained on gabapentin 100 mg 3 times daily.  Used to be on 300 mg 4 times daily in the past  Spots: noticed them Tuesday. States that he got a shower and she noticed it. States it was red and hot. Patient did not notice it. Witch hazel and ice. Diminished since them. No pain and or itching  Outpatient Encounter Medications as of 07/13/2021  Medication Sig   acetaminophen (TYLENOL) 650 MG CR tablet Take 650 mg by mouth every 8 (eight) hours as needed for pain.   aspirin EC 81 MG tablet Take 81 mg by mouth daily. Swallow whole.   cetirizine (ZYRTEC) 10 MG tablet Take 10 mg by mouth daily as needed for allergies.   cromolyn (OPTICROM) 4 % ophthalmic solution Place 1 drop into both eyes in the morning and at bedtime.   docusate sodium (COLACE)  100 MG capsule Take 100 mg by mouth daily as needed for moderate constipation.   Ergocalciferol (VITAMIN D2 PO) Take 1,250 mcg by mouth once a week.   fluticasone (FLONASE) 50 MCG/ACT nasal spray Place 1 spray into both nostrils daily as needed for allergies.   gabapentin (NEURONTIN) 100 MG capsule Take 1 capsule (100 mg total) by mouth 3 (three) times daily.   hydrochlorothiazide (HYDRODIURIL) 25 MG tablet TAKE 1 TABLET BY MOUTH EVERY DAY   HYDROmorphone (DILAUDID) 2 MG tablet Take 1-2 tablets (2-4 mg total) by mouth every 6 (six) hours as needed for severe pain.   isosorbide dinitrate (ISORDIL) 30 MG tablet TAKE 1 TABLET BY MOUTH THREE TIMES A DAY   labetalol (NORMODYNE) 200 MG tablet TAKE 1 TABLET BY MOUTH TWICE A DAY   levothyroxine (SYNTHROID, LEVOTHROID) 100 MCG tablet Take 100 mcg by mouth daily before breakfast.   losartan (COZAAR) 100 MG tablet TAKE 1 TABLET BY MOUTH EVERY DAY   methocarbamol (ROBAXIN) 500 MG tablet Take 1 tablet (500 mg total) by mouth every 6 (six) hours as needed for muscle spasms.   Multiple Vitamin (MULTIVITAMIN) tablet Take 1 tablet by mouth daily.   Multiple Vitamins-Minerals (PRESERVISION AREDS PO) Take 1 capsule by mouth in the morning and at bedtime.   nitroGLYCERIN (NITROSTAT) 0.4 MG SL tablet Place 1 tablet (  0.4 mg total) under the tongue every 5 (five) minutes as needed for chest pain. If your symptoms continue after taking two tablets 5 minutes apart call 911.   Omega-3 Fatty Acids (FISH OIL) 1000 MG CAPS Take 1 capsule by mouth daily at 12 noon.   Polyethyl Glycol-Propyl Glycol (SYSTANE OP) Place 1 drop into both eyes daily as needed (dry eyes).   rosuvastatin (CRESTOR) 20 MG tablet Take 20 mg by mouth at bedtime.   spironolactone (ALDACTONE) 25 MG tablet TAKE 1 TABLET (25 MG TOTAL) BY MOUTH IN THE MORNING.   Vitamin D, Ergocalciferol, (DRISDOL) 1.25 MG (50000 UNIT) CAPS capsule Take 50,000 Units by mouth once a week.   [DISCONTINUED] allopurinol (ZYLOPRIM)  300 MG tablet Take 300 mg by mouth daily.   allopurinol (ZYLOPRIM) 300 MG tablet Take 1 tablet (300 mg total) by mouth daily.   Facility-Administered Encounter Medications as of 07/13/2021  Medication   testosterone cypionate (DEPOTESTOSTERONE CYPIONATE) injection 200 mg   [COMPLETED] testosterone cypionate (DEPOTESTOSTERONE CYPIONATE) injection 200 mg    Past Medical History:  Diagnosis Date   Complication of anesthesia    Pt difficult to intubate; 07/19/03 aborted surgery unable to intubate   Coronary artery disease    Diabetes mellitus without complication ([place])    Dysthymic disorder    Gout    Hx: of in left wrist   Gout    Hearing deficit    Hx: of wears hearing aids   Hypertension    Kidney stones    Hx: of   Multiple myeloma ([place])    Occlusion and stenosis of unspecified carotid artery    Pathologic fracture of vertebrae    Pneumonia    Rosacea    Sleep apnea    Hx: of pt wears CPAP    Past Surgical History:  Procedure Laterality Date   APPENDECTOMY     BACK SURGERY  [phone]   lumb   CARDIAC CATHETERIZATION     Hx: of 04/05/10   CAROTID ARTERY - SUBCLAVIAN ARTERY BYPASS GRAFT  2004   Hx: of-left   CARPAL TUNNEL RELEASE Right 04/13/2013   Procedure: RIGHT CARPAL TUNNEL RELEASE;  Surgeon: Cammie Sickle., MD;  Location: [facility];  Service: Orthopedics;  Laterality: Right;   CATARACT EXTRACTION     Hx: of left eye only   COLONOSCOPY     COLONOSCOPY W/ BIOPSIES AND POLYPECTOMY     Hx: of   CORONARY ARTERY BYPASS GRAFT  2012   Hx: of [place]   Hx: of-lt   HERNIA REPAIR  1963   Hx: of right   JOINT REPLACEMENT  2006   Hx: of left knee   LUMBAR LAMINECTOMY/DECOMPRESSION MICRODISCECTOMY N/A 11/18/2012   Procedure: REVISION L2 - L4 DECOMPRESSION DISCECTOMY 2 LEVELS;  Surgeon: Melina Schools, MD;  Location: [facility];  Service: Orthopedics;  Laterality: N/A;   ROTATOR CUFF REPAIR  2003   Hx: of right   TONSILLECTOMY     TOTAL  KNEE ARTHROPLASTY Right 03/12/2021   Procedure: TOTAL KNEE ARTHROPLASTY;  Surgeon: Gaynelle Arabian, MD;  Location: [facility];  Service: Orthopedics;  Laterality: Right;   TRIGGER FINGER RELEASE Right 04/13/2013   Procedure: RIGHT THUMB A-1 PULLEY RELEASE;  Surgeon: Cammie Sickle., MD;  Location: [facility];  Service: Orthopedics;  Laterality: Right;    Family History  Problem Relation Age of Onset   Cancer - Colon Mother    Heart  disease Father    Alzheimer's disease Sister    Stroke Sister        24 or 7   Alzheimer's disease Sister    Alzheimer's disease Brother    Dementia Other     Social History   Socioeconomic History   Marital status: Married    Spouse name: Not on file   Number of children: 1   Years of education: college   Highest education level: Not on file  Occupational History   Occupation: Retired   Tobacco Use   Smoking status: Former    Packs/day: 0.50    Years: 30.00    Total pack years: 15.00    Types: Cigarettes    Quit date: 04/08/1978    Years since quitting: 43.2   Smokeless tobacco: Never   Tobacco comments:    Quit smoking cigarretes in 1980  Vaping Use   Vaping Use: Never used  Substance and Sexual Activity   Alcohol use: No    Alcohol/week: 0.0 standard drinks of alcohol   Drug use: No   Sexual activity: Not on file  Other Topics Concern   Not on file  Social History Narrative   Rare caffeine use    Social Determinants of Health   Financial Resource Strain: Not on file  Food Insecurity: Not on file  Transportation Needs: Not on file  Physical Activity: Not on file  Stress: Not on file  Social Connections: Not on file  Intimate Partner Violence: Not on file    Review of Systems  Constitutional:  Negative for chills and fever.  Cardiovascular:  Positive for leg swelling. Negative for chest pain.  Skin:  Positive for rash.  Psychiatric/Behavioral:  Negative for hallucinations and suicidal ideas.          Objective    BP (!) 110/50 (BP Location: Right Arm, Patient Position: Sitting, Cuff Size: Normal)   Pulse 61   Temp (!) 97.2 F (36.2 C) (Temporal)   Ht 5' 5.5" (1.664 m)   Wt 236 lb 6.4 oz (107.2 kg)   SpO2 97%   BMI 38.74 kg/m   Physical Exam Vitals and nursing note reviewed.  Constitutional:      Appearance: Normal appearance.  Cardiovascular:     Rate and Rhythm: Normal rate and regular rhythm.     Heart sounds: Normal heart sounds.  Pulmonary:     Effort: Pulmonary effort is normal.     Breath sounds: Normal breath sounds.  Abdominal:     General: Bowel sounds are normal.  Musculoskeletal:     Right lower leg: Edema present.     Left lower leg: Edema present.  Skin:    General: Skin is warm.     Findings: Rash present.          Comments: Slight discoloration of the skin. No warmth or tenderness  Neurological:     Mental Status: He is alert.  Psychiatric:        Mood and Affect: Mood normal.        Behavior: Behavior normal.        Thought Content: Thought content normal.        Judgment: Judgment normal.         Assessment & Plan:   Problem List Items Addressed This Visit       Cardiovascular and Mediastinum   CAD native coronary artery 3 Vessel CAD; RCA 99% mid; Mid Cx; 80%, Ostial LAD 80%, Mid 90% S/P CABG  x5  04/09/10  LIMA to the LAD, SVG to D1, SVG to OM1 & OM3, SVG to Rt PDA.    Followed by Dr Moshe Salisbury every 6 months        Respiratory   OSA on CPAP    Was followed by Dr young. Patient states that he uses the CPAP nightly        Endocrine   Hypogonadism in male    States he injects 281m once every 3 month. Administered 2068mIM in office times one dose. Next office visit will check testosterone levels. Previous CBC had lower hemoglobin      Type 2 diabetes mellitus without complication, without long-term current use of insulin (HCC)    Historical dx. Will check A1C today. Not on any anti diabetic medications      Relevant Orders    CBC (Completed)   Comprehensive metabolic panel (Completed)   Hemoglobin A1c (Completed)     Musculoskeletal and Integument   OA (osteoarthritis) of knee    Currently on gapapentin 10080mID. They are interested weaning down on the medication. I informed them to take 1 pill away a week. If he tolerates it then we will wean him off      Relevant Medications   allopurinol (ZYLOPRIM) 300 MG tablet     Other   MGUS (monoclonal gammopathy of unknown significance) - Primary    Was followed by Heme/Onc but now managed by Dr GanMoshe Salisbury    Relevant Medications   allopurinol (ZYLOPRIM) 300 MG tablet   Morbid obesity (HCC)   History of gout    Maintained on allopurinol 300m90mily. Pending uric acid level      Relevant Orders   Uric acid (Completed)    Return in about 3 months (around 10/13/2021) for recheck and check testosterone .   MattRomilda Garret

## 2021-07-13 NOTE — Assessment & Plan Note (Signed)
Historical dx. Will check A1C today. Not on any anti diabetic medications

## 2021-07-13 NOTE — Assessment & Plan Note (Signed)
Currently on gapapentin '100mg'$  TID. They are interested weaning down on the medication. I informed them to take 1 pill away a week. If he tolerates it then we will wean him off

## 2021-07-13 NOTE — Assessment & Plan Note (Signed)
Maintained on allopurinol '300mg'$  daily. Pending uric acid level

## 2021-07-16 ENCOUNTER — Other Ambulatory Visit: Payer: Self-pay | Admitting: Cardiology

## 2021-07-16 DIAGNOSIS — I10 Essential (primary) hypertension: Secondary | ICD-10-CM

## 2021-07-21 ENCOUNTER — Encounter: Payer: Self-pay | Admitting: Nurse Practitioner

## 2021-08-03 ENCOUNTER — Telehealth: Payer: Self-pay | Admitting: Nurse Practitioner

## 2021-08-03 NOTE — Telephone Encounter (Signed)
Caller Name: [name] [name]) Call back phone #: [phone] or [phone]  MEDICATION(S): Vitamin D 2 '1250mg'$  and rosuvastatin (CRESTOR) 20 MG tablet   Days of Med Remaining: 2 and a half weeks  Has the patient contacted their pharmacy (YES/NO)?  No, filled by former provider IF YES, when and what did the pharmacy advise?  IF NO, request that the patient contact the pharmacy for the refills in the future.             The pharmacy will send an electronic request (except for controlled medications).  Preferred [facility]: [facility] [address]  ~~~Please advise patient/caregiver to allow 2-3 business days to process RX refills.

## 2021-08-06 ENCOUNTER — Other Ambulatory Visit: Payer: Self-pay | Admitting: Nurse Practitioner

## 2021-08-06 DIAGNOSIS — I2510 Atherosclerotic heart disease of native coronary artery without angina pectoris: Secondary | ICD-10-CM

## 2021-08-06 MED ORDER — ROSUVASTATIN CALCIUM 20 MG PO TABS: 20.0000 mg | ORAL_TABLET | Freq: Every day | ORAL | 1 refills | Status: DC

## 2021-08-06 NOTE — Telephone Encounter (Signed)
Called and informed pt that Rx was sent to Pharmacy, and verified it was the right pharmacy.

## 2021-08-06 NOTE — Telephone Encounter (Signed)
Medication sent to pharmacy on file.  

## 2021-08-07 ENCOUNTER — Other Ambulatory Visit: Payer: Self-pay | Admitting: Cardiology

## 2021-08-07 DIAGNOSIS — I2510 Atherosclerotic heart disease of native coronary artery without angina pectoris: Secondary | ICD-10-CM

## 2021-08-07 DIAGNOSIS — I10 Essential (primary) hypertension: Secondary | ICD-10-CM

## 2021-09-03 ENCOUNTER — Other Ambulatory Visit: Payer: Self-pay | Admitting: Cardiology

## 2021-09-03 DIAGNOSIS — I10 Essential (primary) hypertension: Secondary | ICD-10-CM

## 2021-09-07 ENCOUNTER — Other Ambulatory Visit: Payer: Self-pay | Admitting: Cardiology

## 2021-09-07 DIAGNOSIS — I2510 Atherosclerotic heart disease of native coronary artery without angina pectoris: Secondary | ICD-10-CM

## 2021-09-07 DIAGNOSIS — I10 Essential (primary) hypertension: Secondary | ICD-10-CM

## 2021-09-13 ENCOUNTER — Telehealth: Payer: Self-pay | Admitting: Internal Medicine

## 2021-09-13 NOTE — Telephone Encounter (Signed)
[facility],  Can you keep an eye out for this fax for Dr Annamaria Boots from [place]. But before he signs it confirm what settings they have in place currently. And have Dr Annamaria Boots say if he is ok with changing the settings first before he signs it.   Thank you

## 2021-09-13 NOTE — Telephone Encounter (Signed)
Patient's wife is calling because there's the pressure on the CPAP machine fluctuates throughout the night. [place] with [facility] is supposed to send the readings over to [facility]. They want to know if he would give brittany the authorization to change the settings to a set pressure throughout the night

## 2021-09-24 ENCOUNTER — Telehealth: Payer: Self-pay | Admitting: Nurse Practitioner

## 2021-09-24 NOTE — Telephone Encounter (Signed)
LVM for pt to rtn my call to schedule AWV with [facility] call back # [phone] 

## 2021-09-27 ENCOUNTER — Other Ambulatory Visit: Payer: Self-pay | Admitting: Cardiology

## 2021-10-11 ENCOUNTER — Ambulatory Visit (INDEPENDENT_AMBULATORY_CARE_PROVIDER_SITE_OTHER): Payer: Medicare PPO

## 2021-10-11 VITALS — Ht 65.5 in | Wt 236.0 lb

## 2021-10-11 DIAGNOSIS — Z0000 Encounter for general adult medical examination without abnormal findings: Secondary | ICD-10-CM

## 2021-10-11 NOTE — Progress Notes (Signed)
Subjective:   [name] [name] is a 88 y.o. male who presents for Medicare Annual/Subsequent preventive examination.  Review of Systems    Virtual Visit via Telephone Note  I connected with  [name] [name] on 10/11/21 at 11:15 AM EDT by telephone and verified that I am speaking with the correct person using two identifiers.  Location: Patient: Home Provider: Office Persons participating in the virtual visit: patient/Nurse Health Advisor   I discussed the limitations, risks, security and privacy concerns of performing an evaluation and management service by telephone and the availability of in person appointments. The patient expressed understanding and agreed to proceed.  Interactive audio and video telecommunications were attempted between this nurse and patient, however failed, due to patient having technical difficulties OR patient did not have access to video capability.  We continued and completed visit with audio only.  Some vital signs may be absent or patient reported.   [name] Peaches, LPN  Cardiac Risk Factors include: advanced age (>72mn, >>84women)     Objective:    Today's Vitals   10/11/21 1133  Weight: 236 lb (107 kg)  Height: 5' 5.5" (1.664 m)   Body mass index is 38.68 kg/m.     10/11/2021   11:44 AM 03/12/2021    1:00 PM 02/28/2021   11:36 AM 04/07/2013    3:58 PM 11/18/2012   11:41 AM 11/17/2012    1:41 PM  Advanced Directives  Does Patient Have a Medical Advance Directive? Yes Yes Yes Patient has advance directive, copy not in chart Patient has advance directive, copy in chart Patient has advance directive, copy not in chart  Type of Advance Directive [place] will [place] will [place] will Living will  HNanaw[place] EstatesLiving will  Does patient want to make changes to medical advance directive? No - Patient declined No - Patient declined  No change requested     Copy of [place] Estates[place] Chart? Yes - validated most recent copy scanned in chart (See row information) Yes - validated most recent copy scanned in chart (See row information) No - copy requested   Copy requested from family    Current Medications (verified) Outpatient Encounter Medications as of 10/11/2021  Medication Sig   acetaminophen (TYLENOL) 650 MG CR tablet Take 650 mg by mouth every 8 (eight) hours as needed for pain.   allopurinol (ZYLOPRIM) 300 MG tablet Take 1 tablet (300 mg total) by mouth daily.   aspirin EC 81 MG tablet Take 81 mg by mouth daily. Swallow whole.   cetirizine (ZYRTEC) 10 MG tablet Take 10 mg by mouth daily as needed for allergies.   cromolyn (OPTICROM) 4 % ophthalmic solution Place 1 drop into both eyes in the morning and at bedtime.   docusate sodium (COLACE) 100 MG capsule Take 100 mg by mouth daily as needed for moderate constipation.   Ergocalciferol (VITAMIN D2 PO) Take 1,250 mcg by mouth once a week.   fluticasone (FLONASE) 50 MCG/ACT nasal spray Place 1 spray into both nostrils daily as needed for allergies.   gabapentin (NEURONTIN) 100 MG capsule Take 1 capsule (100 mg total) by mouth 3 (three) times daily. (Patient not taking: Reported on 10/11/2021)   hydrochlorothiazide (HYDRODIURIL) 25 MG tablet TAKE 1 TABLET BY MOUTH EVERY DAY   HYDROmorphone (DILAUDID) 2 MG tablet Take 1-2 tablets (2-4 mg total) by mouth every 6 (six) hours as needed for severe pain. (Patient  not taking: Reported on 10/11/2021)   isosorbide dinitrate (ISORDIL) 30 MG tablet TAKE 1 TABLET BY MOUTH THREE TIMES A DAY   labetalol (NORMODYNE) 200 MG tablet TAKE 1 TABLET BY MOUTH TWICE A DAY   levothyroxine (SYNTHROID, LEVOTHROID) 100 MCG tablet Take 100 mcg by mouth daily before breakfast.   losartan (COZAAR) 100 MG tablet TAKE 1 TABLET BY MOUTH EVERY DAY   methocarbamol (ROBAXIN) 500 MG tablet Take 1 tablet (500 mg total) by mouth every 6 (six) hours as needed for muscle  spasms.   Multiple Vitamin (MULTIVITAMIN) tablet Take 1 tablet by mouth daily.   Multiple Vitamins-Minerals (PRESERVISION AREDS PO) Take 1 capsule by mouth in the morning and at bedtime.   nitroGLYCERIN (NITROSTAT) 0.4 MG SL tablet Place 1 tablet (0.4 mg total) under the tongue every 5 (five) minutes as needed for chest pain. If your symptoms continue after taking two tablets 5 minutes apart call 911.   Omega-3 Fatty Acids (FISH OIL) 1000 MG CAPS Take 1 capsule by mouth daily at 12 noon.   Polyethyl Glycol-Propyl Glycol (SYSTANE OP) Place 1 drop into both eyes daily as needed (dry eyes).   rosuvastatin (CRESTOR) 20 MG tablet Take 1 tablet (20 mg total) by mouth at bedtime.   spironolactone (ALDACTONE) 25 MG tablet TAKE 1 TABLET (25 MG TOTAL) BY MOUTH IN THE MORNING.   Vitamin D, Ergocalciferol, (DRISDOL) 1.25 MG (50000 UNIT) CAPS capsule Take 50,000 Units by mouth once a week.   Facility-Administered Encounter Medications as of 10/11/2021  Medication   testosterone cypionate (DEPOTESTOSTERONE CYPIONATE) injection 200 mg    Allergies (verified) Amlodipine, Ciprofloxacin, Hydralazine, Hydrocodone, Levofloxacin, Oxycodone, Tramadol, and Cardizem [diltiazem hcl]   History: Past Medical History:  Diagnosis Date   Complication of anesthesia    Pt difficult to intubate; 07/19/03 aborted surgery unable to intubate   Coronary artery disease    Diabetes mellitus without complication ([place])    Dysthymic disorder    Gout    Hx: of in left wrist   Gout    Hearing deficit    Hx: of wears hearing aids   Hypertension    Kidney stones    Hx: of   Multiple myeloma ([place])    Occlusion and stenosis of unspecified carotid artery    Pathologic fracture of vertebrae    Pneumonia    Rosacea    Sleep apnea    Hx: of pt wears CPAP   Past Surgical History:  Procedure Laterality Date   APPENDECTOMY     BACK SURGERY  [phone]   lumb   CARDIAC CATHETERIZATION     Hx: of 04/05/10   CAROTID ARTERY -  SUBCLAVIAN ARTERY BYPASS GRAFT  2004   Hx: of-left   CARPAL TUNNEL RELEASE Right 04/13/2013   Procedure: RIGHT CARPAL TUNNEL RELEASE;  Surgeon: [name] [name]., MD;  Location: [facility];  Service: Orthopedics;  Laterality: Right;   CATARACT EXTRACTION     Hx: of left eye only   COLONOSCOPY     COLONOSCOPY W/ BIOPSIES AND POLYPECTOMY     Hx: of   CORONARY ARTERY BYPASS GRAFT  2012   Hx: of [place]   Hx: of-lt   HERNIA REPAIR  1963   Hx: of right   JOINT REPLACEMENT  2006   Hx: of left knee   LUMBAR LAMINECTOMY/DECOMPRESSION MICRODISCECTOMY N/A 11/18/2012   Procedure: REVISION L2 - L4 DECOMPRESSION DISCECTOMY 2 LEVELS;  Surgeon: Melina Schools, MD;  Location: [facility];  Service: Orthopedics;  Laterality: N/A;   ROTATOR CUFF REPAIR  2003   Hx: of right   TONSILLECTOMY     TOTAL KNEE ARTHROPLASTY Right 03/12/2021   Procedure: TOTAL KNEE ARTHROPLASTY;  Surgeon: Gaynelle Arabian, MD;  Location: [facility];  Service: Orthopedics;  Laterality: Right;   TRIGGER FINGER RELEASE Right 04/13/2013   Procedure: RIGHT THUMB A-1 PULLEY RELEASE;  Surgeon: [name] [name]., MD;  Location: [facility];  Service: Orthopedics;  Laterality: Right;   Family History  Problem Relation Age of Onset   Cancer - Colon Mother    Heart disease Father    Alzheimer's disease Sister    Stroke Sister        69 or 7   Alzheimer's disease Sister    Alzheimer's disease Brother    Dementia Other    Social History   Socioeconomic History   Marital status: Married    Spouse name: Not on file   Number of children: 1   Years of education: college   Highest education level: Not on file  Occupational History   Occupation: Retired   Tobacco Use   Smoking status: Former    Packs/day: 0.50    Years: 30.00    Total pack years: 15.00    Types: Cigarettes    Quit date: 04/08/1978    Years since quitting: 43.5   Smokeless tobacco: Never   Tobacco comments:    Quit smoking  cigarretes in 1980  Vaping Use   Vaping Use: Never used  Substance and Sexual Activity   Alcohol use: No    Alcohol/week: 0.0 standard drinks of alcohol   Drug use: No   Sexual activity: Not on file  Other Topics Concern   Not on file  Social History Narrative   Rare caffeine use    Social Determinants of Health   Financial Resource Strain: Low Risk  (10/11/2021)   Overall Financial Resource Strain (CARDIA)    Difficulty of Paying Living Expenses: Not hard at all  Food Insecurity: No Food Insecurity (10/11/2021)   Hunger Vital Sign    Worried About Running Out of Food in the Last Year: Never true    [place] in the Last Year: Never true  Transportation Needs: No Transportation Needs (10/11/2021)   PRAPARE - Hydrologist (Medical): No    Lack of Transportation (Non-Medical): No  Physical Activity: Inactive (10/11/2021)   Exercise Vital Sign    Days of Exercise per Week: 0 days    Minutes of Exercise per Session: 0 min  Stress: No Stress Concern Present (10/11/2021)   [place]    Feeling of Stress : Not at all  Social Connections: [place] (10/11/2021)   Social Connection and Isolation Panel [NHANES]    Frequency of Communication with Friends and Family: More than three times a week    Frequency of Social Gatherings with Friends and Family: More than three times a week    Attends Religious Services: More than 4 times per year    Active Member of [employer] or Organizations: Yes    Attends Music therapist: More than 4 times per year    Marital Status: Married    Tobacco Counseling Counseling given: Not Answered Tobacco comments: Quit smoking cigarretes in 1980   Clinical Intake:  Pre-visit preparation completed: No  Pain : No/denies pain  BMI - recorded: 38.68 Nutritional Status: BMI > 30  Obese Nutritional Risks: None Diabetes: No  How  often do you need to have someone help you when you read instructions, pamphlets, or other written materials from your doctor or pharmacy?: 1 - Never  Diabetic?  No  Interpreter Needed?: No  Information entered by :: Rolene Arbour LPN   Activities of Daily Living    10/11/2021   11:43 AM 03/12/2021    1:00 PM  In your present state of health, do you have any difficulty performing the following activities:  Hearing? 1 1  Comment wears hearing aids hearing aids pt did not bring  Vision? 0 0  Difficulty concentrating or making decisions? 0 1  Comment  memory  Walking or climbing stairs? 0 0  Dressing or bathing? 0 0  Doing errands, shopping? 0 0  Preparing Food and eating ? N   Using the Toilet? N   In the past six months, have you accidently leaked urine? N   Do you have problems with loss of bowel control? N   Managing your Medications? N   Managing your Finances? N   Housekeeping or managing your Housekeeping? N     Patient Care Team: Michela Pitcher, NP as PCP - General (Nurse Practitioner) Deneise Lever, MD as Consulting Physician (Pulmonary Disease) Allyn Kenner, MD (Dermatology) Adrian Prows, MD as Consulting Physician (Cardiology)  Indicate any recent Medical Services you may have received from other than [facility] providers in the past year (date may be approximate).     Assessment:   This is a routine wellness examination for Arius.  Hearing/Vision screen Hearing Screening - Comments:: Wears hearing aids Vision Screening - Comments:: Wears reading glasses - up to date with routine eye exams with  Dr Katy Fitch  Dietary issues and exercise activities discussed: Current Exercise Habits: The patient does not participate in regular exercise at present, Exercise limited by: None identified   Goals Addressed               This Visit's Progress     Stay Healthy (pt-stated)        Keep my yard looking nice.       Depression Screen    10/11/2021   11:41 AM 07/13/2021    12:45 PM 07/13/2021    9:51 AM  PHQ 2/9 Scores  PHQ - 2 Score 0 2 0  PHQ- 9 Score  7     Fall Risk    10/11/2021   11:43 AM 07/13/2021    9:49 AM 06/01/2015    1:26 PM  Fall Risk   Falls in the past year? 1 1 No  Number falls in past yr: 0 1   Injury with Fall? 0 1   Risk for fall due to : No Fall Risks    Follow up Falls prevention discussed      [place]:  Any stairs in or around the home? Yes  If so, are there any without handrails? No  Home free of loose throw rugs in walkways, pet beds, electrical cords, etc? Yes  Adequate lighting in your home to reduce risk of falls? Yes   ASSISTIVE DEVICES UTILIZED TO PREVENT FALLS:  Life alert? No  Use of a cane, walker or w/c? No  Grab bars in the bathroom? Yes  Shower chair or bench in shower? Yes  Elevated toilet seat or a handicapped toilet? Yes   TIMED UP AND  GO:  Was the test performed? No . Audio Visit    Cognitive Function:    05/29/2016    2:42 PM 11/30/2015    1:40 PM 06/01/2015    1:40 PM  MMSE - Mini Mental State Exam  Orientation to time 4 4 4   Orientation to Place 4 4 3   Registration 3 3 2   Attention/ Calculation 2 3 5   Recall 2 3 3   Language- name 2 objects 2 2 2   Language- repeat 1 1 1   Language- follow 3 step command 3 3 3   Language- read & follow direction 1 1 1   Write a sentence 1 0 1  Copy design 1 1 1   Total score 24 25 26         10/11/2021   11:45 AM  6CIT Screen  What Year? 0 points  What month? 0 points  What time? 0 points  Count back from 20 0 points  Months in reverse 0 points  Repeat phrase 0 points  Total Score 0 points    Immunizations Immunization History  Administered Date(s) Administered   Fluad Quad(high Dose 65+) 10/19/2018   Influenza, High Dose Seasonal PF 10/06/2016   Influenza,inj,quad, With Preservative 10/28/2016, 09/28/2017, 09/29/2018   Influenza-Unspecified 10/02/2016   PFIZER(Purple Top)SARS-COV-2 Vaccination 02/12/2019,  03/05/2019, 11/22/2019, 11/03/2020   Pfizer Covid-19 Vaccine Bivalent Booster 34yr & up 08/08/2020   Pneumococcal Polysaccharide-23 11/20/2012   Pneumococcal-Unspecified 01/29/2016    TDAP status: Due, Education has been provided regarding the importance of this vaccine. Advised may receive this vaccine at local pharmacy or Health Dept. Aware to provide a copy of the vaccination record if obtained from local pharmacy or Health Dept. Verbalized acceptance and understanding.  Flu Vaccine status: Due, Education has been provided regarding the importance of this vaccine. Advised may receive this vaccine at local pharmacy or Health Dept. Aware to provide a copy of the vaccination record if obtained from local pharmacy or Health Dept. Verbalized acceptance and understanding.  Pneumococcal vaccine status: Due, Education has been provided regarding the importance of this vaccine. Advised may receive this vaccine at local pharmacy or Health Dept. Aware to provide a copy of the vaccination record if obtained from local pharmacy or Health Dept. Verbalized acceptance and understanding.  Covid-19 vaccine status: Completed vaccines  Qualifies for Shingles Vaccine? Yes   Zostavax completed No   Shingrix Completed?: No.    Education has been provided regarding the importance of this vaccine. Patient has been advised to call insurance company to determine out of pocket expense if they have not yet received this vaccine. Advised may also receive vaccine at local pharmacy or Health Dept. Verbalized acceptance and understanding.  Screening Tests Health Maintenance  Topic Date Due   FOOT EXAM  Never done   OPHTHALMOLOGY EXAM  Never done   COVID-19 Vaccine (6 - Pfizer risk series) 10/27/2021 (Originally 12/29/2020)   Zoster Vaccines- Shingrix (1 of 2) 01/10/2022 (Originally 12/01/1951)   INFLUENZA VACCINE  04/28/2022 (Originally 08/28/2021)   Pneumonia Vaccine 88 Years old (2 - PCV) 10/12/2022 (Originally  01/28/2017)   TETANUS/TDAP  10/12/2022 (Originally 12/01/1951)   HEMOGLOBIN A1C  01/12/2022   HPV VACCINES  Aged Out    Health Maintenance  Health Maintenance Due  Topic Date Due   FOOT EXAM  Never done   OPHTHALMOLOGY EXAM  Never done    Colorectal cancer screening: No longer required.   Lung Cancer Screening: (Low Dose CT Chest recommended if Age 86-80years, 30 pack-year currently smoking  OR have quit w/in 15years.) does not qualify.     Additional Screening:  Hepatitis C Screening: does not qualify; Completed   Vision Screening: Recommended annual ophthalmology exams for early detection of glaucoma and other disorders of the eye. Is the patient up to date with their annual eye exam?  Yes  Who is the provider or what is the name of the office in which the patient attends annual eye exams? Dr Katy Fitch If pt is not established with a provider, would they like to be referred to a provider to establish care? No .   Dental Screening: Recommended annual dental exams for proper oral hygiene  Community Resource Referral / Chronic Care Management:  CRR required this visit?  No   CCM required this visit?  No      Plan:     I have personally reviewed and noted the following in the patient's chart:   Medical and social history Use of alcohol, tobacco or illicit drugs  Current medications and supplements including opioid prescriptions. Patient is not currently taking opioid prescriptions. Functional ability and status Nutritional status Physical activity Advanced directives List of other physicians Hospitalizations, surgeries, and ER visits in previous 12 months Vitals Screenings to include cognitive, depression, and falls Referrals and appointments  In addition, I have reviewed and discussed with patient certain preventive protocols, quality metrics, and best practice recommendations. A written personalized care plan for preventive services as well as general preventive  health recommendations were provided to patient.     [name] Peaches, LPN   [id number]   Nurse Notes: None

## 2021-10-11 NOTE — Patient Instructions (Addendum)
[name] [name] , Thank you for taking time to come for your Medicare Wellness Visit. I appreciate your ongoing commitment to your health goals. Please review the following plan we discussed and let me know if I can assist you in the future.   Screening recommendations/referrals: Colonoscopy: No longer required Recommended yearly ophthalmology/optometry visit for glaucoma screening and checkup Recommended yearly dental visit for hygiene and checkup  Vaccinations: Influenza vaccine: Deferred Pneumococcal vaccine: Deferred Tdap vaccine: Deferred Shingles vaccine: Deferred   Covid-19: Done  Advanced directives: Copy on file  Conditions/risks identified: None  Next appointment: Follow up in one year for your annual wellness visit.    Preventive Care 75 Years and Older, Male  Preventive care refers to lifestyle choices and visits with your health care provider that can promote health and wellness. What does preventive care include? A yearly physical exam. This is also called an annual well check. Dental exams once or twice a year. Routine eye exams. Ask your health care provider how often you should have your eyes checked. Personal lifestyle choices, including: Daily care of your teeth and gums. Regular physical activity. Eating a healthy diet. Avoiding tobacco and drug use. Limiting alcohol use. Practicing safe sex. Taking low doses of aspirin every day. Taking vitamin and mineral supplements as recommended by your health care provider. What happens during an annual well check? The services and screenings done by your health care provider during your annual well check will depend on your age, overall health, lifestyle risk factors, and family history of disease. Counseling  Your health care provider may ask you questions about your: Alcohol use. Tobacco use. Drug use. Emotional well-being. Home and relationship well-being. Sexual activity. Eating habits. History of  falls. Memory and ability to understand (cognition). Work and work Statistician. Screening  You may have the following tests or measurements: Height, weight, and BMI. Blood pressure. Lipid and cholesterol levels. These may be checked every 5 years, or more frequently if you are over 61 years old. Skin check. Lung cancer screening. You may have this screening every year starting at age 77 if you have a 30-pack-year history of smoking and currently smoke or have quit within the past 15 years. Fecal occult blood test (FOBT) of the stool. You may have this test every year starting at age 74. Flexible sigmoidoscopy or colonoscopy. You may have a sigmoidoscopy every 5 years or a colonoscopy every 10 years starting at age 2. Prostate cancer screening. Recommendations will vary depending on your family history and other risks. Hepatitis C blood test. Hepatitis B blood test. Sexually transmitted disease (STD) testing. Diabetes screening. This is done by checking your blood sugar (glucose) after you have not eaten for a while (fasting). You may have this done every 1-3 years. Abdominal aortic aneurysm (AAA) screening. You may need this if you are a current or former smoker. Osteoporosis. You may be screened starting at age 44 if you are at high risk. Talk with your health care provider about your test results, treatment options, and if necessary, the need for more tests. Vaccines  Your health care provider may recommend certain vaccines, such as: Influenza vaccine. This is recommended every year. Tetanus, diphtheria, and acellular pertussis (Tdap, Td) vaccine. You may need a Td booster every 10 years. Zoster vaccine. You may need this after age 50. Pneumococcal 13-valent conjugate (PCV13) vaccine. One dose is recommended after age 51. Pneumococcal polysaccharide (PPSV23) vaccine. One dose is recommended after age 13. Talk to your health care provider  about which screenings and vaccines you need and  how often you need them. This information is not intended to replace advice given to you by your health care provider. Make sure you discuss any questions you have with your health care provider. Document Released: 02/10/2015 Document Revised: 10/04/2015 Document Reviewed: 11/15/2014 Elsevier Interactive Patient Education  2017 [place] Prevention in the Home Falls can cause injuries. They can happen to people of all ages. There are many things you can do to make your home safe and to help prevent falls. What can I do on the outside of my home? Regularly fix the edges of walkways and driveways and fix any cracks. Remove anything that might make you trip as you walk through a door, such as a raised step or threshold. Trim any bushes or trees on the path to your home. Use bright outdoor lighting. Clear any walking paths of anything that might make someone trip, such as rocks or tools. Regularly check to see if handrails are loose or broken. Make sure that both sides of any steps have handrails. Any raised decks and porches should have guardrails on the edges. Have any leaves, snow, or ice cleared regularly. Use sand or salt on walking paths during winter. Clean up any spills in your garage right away. This includes oil or grease spills. What can I do in the bathroom? Use night lights. Install grab bars by the toilet and in the tub and shower. Do not use towel bars as grab bars. Use non-skid mats or decals in the tub or shower. If you need to sit down in the shower, use a plastic, non-slip stool. Keep the floor dry. Clean up any water that spills on the floor as soon as it happens. Remove soap buildup in the tub or shower regularly. Attach bath mats securely with double-sided non-slip rug tape. Do not have throw rugs and other things on the floor that can make you trip. What can I do in the bedroom? Use night lights. Make sure that you have a light by your bed that is easy to  reach. Do not use any sheets or blankets that are too big for your bed. They should not hang down onto the floor. Have a firm chair that has side arms. You can use this for support while you get dressed. Do not have throw rugs and other things on the floor that can make you trip. What can I do in the kitchen? Clean up any spills right away. Avoid walking on wet floors. Keep items that you use a lot in easy-to-reach places. If you need to reach something above you, use a strong step stool that has a grab bar. Keep electrical cords out of the way. Do not use floor polish or wax that makes floors slippery. If you must use wax, use non-skid floor wax. Do not have throw rugs and other things on the floor that can make you trip. What can I do with my stairs? Do not leave any items on the stairs. Make sure that there are handrails on both sides of the stairs and use them. Fix handrails that are broken or loose. Make sure that handrails are as long as the stairways. Check any carpeting to make sure that it is firmly attached to the stairs. Fix any carpet that is loose or worn. Avoid having throw rugs at the top or bottom of the stairs. If you do have throw rugs, attach them to the floor  with carpet tape. Make sure that you have a light switch at the top of the stairs and the bottom of the stairs. If you do not have them, ask someone to add them for you. What else can I do to help prevent falls? Wear shoes that: Do not have high heels. Have rubber bottoms. Are comfortable and fit you well. Are closed at the toe. Do not wear sandals. If you use a stepladder: Make sure that it is fully opened. Do not climb a closed stepladder. Make sure that both sides of the stepladder are locked into place. Ask someone to hold it for you, if possible. Clearly mark and make sure that you can see: Any grab bars or handrails. First and last steps. Where the edge of each step is. Use tools that help you move  around (mobility aids) if they are needed. These include: Canes. Walkers. Scooters. Crutches. Turn on the lights when you go into a dark area. Replace any light bulbs as soon as they burn out. Set up your furniture so you have a clear path. Avoid moving your furniture around. If any of your floors are uneven, fix them. If there are any pets around you, be aware of where they are. Review your medicines with your doctor. Some medicines can make you feel dizzy. This can increase your chance of falling. Ask your doctor what other things that you can do to help prevent falls. This information is not intended to replace advice given to you by your health care provider. Make sure you discuss any questions you have with your health care provider. Document Released: 11/10/2008 Document Revised: 06/22/2015 Document Reviewed: 02/18/2014 Elsevier Interactive Patient Education  2017 [employer].

## 2021-10-12 ENCOUNTER — Ambulatory Visit: Payer: Medicare PPO | Admitting: Nurse Practitioner

## 2021-10-12 DIAGNOSIS — E119 Type 2 diabetes mellitus without complications: Secondary | ICD-10-CM

## 2021-10-12 DIAGNOSIS — E291 Testicular hypofunction: Secondary | ICD-10-CM | POA: Diagnosis not present

## 2021-10-12 LAB — CBC
HCT: 35.7 % — ABNORMAL LOW (ref 39.0–52.0)
Hemoglobin: 12.2 g/dL — ABNORMAL LOW (ref 13.0–17.0)
MCHC: 34.1 g/dL (ref 30.0–36.0)
MCV: 99.4 fl (ref 78.0–100.0)
Platelets: 131 10*3/uL — ABNORMAL LOW (ref 150.0–400.0)
RBC: 3.6 Mil/uL — ABNORMAL LOW (ref 4.22–5.81)
RDW: 16 % — ABNORMAL HIGH (ref 11.5–15.5)
WBC: 5.4 10*3/uL (ref 4.0–10.5)

## 2021-10-12 LAB — BASIC METABOLIC PANEL
BUN: 43 mg/dL — ABNORMAL HIGH (ref 6–23)
CO2: 28 mEq/L (ref 19–32)
Calcium: 10 mg/dL (ref 8.4–10.5)
Chloride: 105 mEq/L (ref 96–112)
Creatinine, Ser: 1.46 mg/dL (ref 0.40–1.50)
GFR: 43.17 mL/min — ABNORMAL LOW (ref >60.00)
Glucose, Bld: 105 mg/dL — ABNORMAL HIGH (ref 70–99)
Potassium: 4.9 mEq/L (ref 3.5–5.1)
Sodium: 138 mEq/L (ref 135–145)

## 2021-10-12 LAB — HEMOGLOBIN A1C: Hgb A1c MFr Bld: 6.5 % (ref 4.6–6.5)

## 2021-10-12 LAB — TESTOSTERONE: Testosterone: 264.29 ng/dL — ABNORMAL LOW (ref 300.00–890.00)

## 2021-10-12 MED ORDER — TESTOSTERONE CYPIONATE 200 MG/ML IM SOLN: 200.0000 mg | Freq: Once | INTRAMUSCULAR | Status: DC

## 2021-10-12 MED ORDER — TESTOSTERONE CYPIONATE 200 MG/ML IM SOLN: 200.0000 mg | INTRAMUSCULAR | 2 refills | Status: DC

## 2021-10-12 NOTE — Progress Notes (Signed)
Established Patient Office Visit  Subjective   Patient ID: [name] [name], male    DOB: Aug 14, 1932  Age: 88 y.o. MRN: [id number]  Chief Complaint  Patient presents with   3 months follow up    HPI  Hypogonadism: testosterone '200mg'$  every 2-3 months. Last administration was done at our office visit  DM2:  Last A1C was 6.3 if stable today continue to monitor every 6 months  HTN/CAD: Checks blood pressure at [facility] once a week to two times a week   Flu shot: Offered to give high-dose flu vaccine to patient office.  He will defer given to the pharmacy when he gets his COVID-vaccine Covid shot: Discussed updating colitis booster.  Patient will get at his local pharmacy.    Review of Systems  Constitutional:  Negative for chills and fever.  Respiratory:  Negative for sputum production.   Cardiovascular:  Negative for chest pain.  Genitourinary:  Negative for dysuria and hematuria.  Musculoskeletal:  Positive for joint pain.  Neurological:  Negative for dizziness and headaches.      Objective:     BP (!) 118/52   Pulse 66   Temp (!) 96.8 F (36 C) (Temporal)   Resp 14   Ht 5' 5.5" (1.664 m)   Wt 230 lb (104.3 kg)   SpO2 96%   BMI 37.69 kg/m  BP Readings from Last 3 Encounters:  10/12/21 (!) 118/52  07/13/21 (!) 110/50  04/12/21 119/60   Wt Readings from Last 3 Encounters:  10/12/21 230 lb (104.3 kg)  10/11/21 236 lb (107 kg)  07/13/21 236 lb 6.4 oz (107.2 kg)      Physical Exam Vitals and nursing note reviewed.  Constitutional:      Appearance: Normal appearance. He is obese.  Cardiovascular:     Rate and Rhythm: Normal rate and regular rhythm.     Heart sounds: Normal heart sounds.  Pulmonary:     Effort: Pulmonary effort is normal.     Breath sounds: Wheezing (RUL) present.  Abdominal:     General: Bowel sounds are normal.  Neurological:     Mental Status: He is alert.      No results found for any visits on 10/12/21.    The ASCVD Risk score  (Arnett DK, et al., 2019) failed to calculate for the following reasons:   The 2019 ASCVD risk score is only valid for ages 84 to 1    Assessment & Plan:   Problem List Items Addressed This Visit       Endocrine   Hypogonadism in male    Patient has 200 mg of testosterone approximate every 6 to 12 weeks IM.  Patient is here today for same.  Pending testosterone draw.  We will go administer testosterone IM to patient in office patient brought supplies but testosterone injection was out of date.  Refill sent to his mail order pharmacy.  Once he gets medication and come in to have testosterone injection done via nurse visit.      Relevant Medications   testosterone cypionate (DEPOTESTOSTERONE CYPIONATE) injection 200 mg   testosterone cypionate (DEPOTESTOSTERONE CYPIONATE) 200 MG/ML injection   Other Relevant Orders   Testosterone   Type 2 diabetes mellitus without complication, without long-term current use of insulin (HCC)    Pending A1c.  Patient currently maintained on diet      Relevant Orders   CBC   Basic metabolic panel   Hemoglobin A1c     Other  Morbid obesity ([place]) - Primary    Patient has lost weight.  Continue working on healthy lifestyle modifications.       Return in about 6 months (around 04/12/2022) for CPE and labs.    Romilda Garret, NP

## 2021-10-12 NOTE — Assessment & Plan Note (Signed)
Patient has lost weight.  Continue working on healthy lifestyle modifications.

## 2021-10-12 NOTE — Assessment & Plan Note (Signed)
Patient has 200 mg of testosterone approximate every 6 to 12 weeks IM.  Patient is here today for same.  Pending testosterone draw.  We will go administer testosterone IM to patient in office patient brought supplies but testosterone injection was out of date.  Refill sent to his mail order pharmacy.  Once he gets medication and come in to have testosterone injection done via nurse visit.

## 2021-10-12 NOTE — Patient Instructions (Signed)
Nice to see you guys I want to see you in 6 months, sooner if you need me I will be in touch with the labs once I have them

## 2021-10-12 NOTE — Assessment & Plan Note (Signed)
Pending A1c.  Patient currently maintained on diet

## 2021-10-13 ENCOUNTER — Encounter: Payer: Self-pay | Admitting: Nurse Practitioner

## 2021-10-13 DIAGNOSIS — E291 Testicular hypofunction: Secondary | ICD-10-CM

## 2021-10-15 ENCOUNTER — Ambulatory Visit: Payer: Medicare PPO | Admitting: Nurse Practitioner

## 2021-10-15 MED ORDER — TESTOSTERONE CYPIONATE 200 MG/ML IM SOLN: 200.0000 mg | INTRAMUSCULAR | 2 refills | Status: DC

## 2021-10-15 MED ORDER — TESTOSTERONE CYPIONATE 200 MG/ML IM SOLN: 200.0000 mg | INTRAMUSCULAR | 0 refills | Status: DC

## 2021-10-15 NOTE — Telephone Encounter (Signed)
[name] [name] can you re send his RX for Testosterone to [facility]? I pended I tried to refill but it changes it to print instead of normal e scribe. Thank you.

## 2021-10-15 NOTE — Telephone Encounter (Signed)
See question about Allopurinol. Thank you

## 2021-10-15 NOTE — Telephone Encounter (Signed)
Hey I sent in the [facility] to [facility] for you.

## 2021-10-19 ENCOUNTER — Ambulatory Visit (INDEPENDENT_AMBULATORY_CARE_PROVIDER_SITE_OTHER): Payer: Medicare PPO

## 2021-10-19 DIAGNOSIS — E291 Testicular hypofunction: Secondary | ICD-10-CM

## 2021-10-19 MED ORDER — TESTOSTERONE CYPIONATE 200 MG/ML IM SOLN
200.0000 mg | Freq: Once | INTRAMUSCULAR | Status: AC
  Administered 2021-10-19: 200 mg via INTRAMUSCULAR

## 2021-10-19 NOTE — Progress Notes (Signed)
Per orders of Dr. Elsie Stain, injection of testosterone cypionate 200 mg given in RUOQ given by Ozzie Hoyle. Patient tolerated injection well.

## 2021-10-22 ENCOUNTER — Encounter: Payer: Self-pay | Admitting: Cardiology

## 2021-10-22 ENCOUNTER — Ambulatory Visit: Payer: Medicare PPO | Admitting: Cardiology

## 2021-10-22 VITALS — BP 133/54 | HR 67 | Temp 97.8°F | Resp 14 | Ht 65.5 in | Wt 237.0 lb

## 2021-10-22 DIAGNOSIS — I10 Essential (primary) hypertension: Secondary | ICD-10-CM

## 2021-10-22 DIAGNOSIS — E782 Mixed hyperlipidemia: Secondary | ICD-10-CM

## 2021-10-22 DIAGNOSIS — I2510 Atherosclerotic heart disease of native coronary artery without angina pectoris: Secondary | ICD-10-CM

## 2021-10-22 MED ORDER — ISOSORBIDE MONONITRATE ER 60 MG PO TB24: 60.0000 mg | ORAL_TABLET | Freq: Every day | ORAL | 3 refills | Status: DC

## 2021-10-22 NOTE — Progress Notes (Signed)
Primary Physician/Referring:  [name] Pitcher, NP  Patient ID: [name] [name], male    DOB: [birth year], 88 y.o.   MRN: [id number]  Chief Complaint  Patient presents with   Coronary Artery Disease   Hyperlipidemia   Hypertension   Follow-up    6 months   HPI:    [name] [name]  is a 88 y.o. Caucasian male with coronary disease with CABG in 2012, hypertension, mixed hyperlipidemia, hyperglycemia, h/o gout, OSA on CPAP, former smoker, morbid obesity, multiple myeloma in regression who presents to the office for 70-monthfollow-up of hypertension, hyperlipidemia and coronary artery disease. He underwent right knee replacement on 03/12/2021, he still recuperating from this and still has edema and pain.  He was recently evaluated by Dr. GGunnar BullaMagrinat and told to have complete regression of multiple myeloma and recommended annual SPEP evaluation only. He is being followed by Orthopedics for strained left LCL.   He has not had any further chest pain, dyspnea has remained stable, no pedal edema, no PND or orthopnea. He does report blurred vision with Imdur, specifically after taking the second dose. Blurred vision last a few seconds and resolves on its own. He is not sure if its confined to one eye or both. He is still active, doing yard work and riding his tractor.  Past Medical History:  Diagnosis Date   Complication of anesthesia    Pt difficult to intubate; 07/19/03 aborted surgery unable to intubate   Coronary artery disease    Diabetes mellitus without complication ([place]    Dysthymic disorder    Gout    Hx: of in left wrist   Gout    Hearing deficit    Hx: of wears hearing aids   Hypertension    Kidney stones    Hx: of   Multiple myeloma (HCC)    Occlusion and stenosis of unspecified carotid artery    Pathologic fracture of vertebrae    Pneumonia    Rosacea    Sleep apnea    Hx: of pt wears CPAP   Social History   Tobacco Use   Smoking status: Former    Packs/day: 0.50     Years: 30.00    Total pack years: 15.00    Types: Cigarettes    Quit date: 04/08/1978    Years since quitting: 43.5   Smokeless tobacco: Never   Tobacco comments:    Quit smoking cigarretes in 1980  Substance Use Topics   Alcohol use: No    Alcohol/week: 0.0 standard drinks of alcohol   Marital Status: Married  ROS  Review of Systems  Cardiovascular:  Positive for leg swelling. Negative for chest pain, claudication, dyspnea on exertion and syncope.  Respiratory:  Positive for snoring.   Musculoskeletal:  Positive for joint pain (Left knee).  Gastrointestinal:  Negative for melena.   Objective  Blood pressure (!) 133/54, pulse 67, temperature 97.8 F (36.6 C), temperature source Temporal, resp. rate 14, height 5' 5.5" (1.664 m), weight 237 lb (107.5 kg), SpO2 96 %.     10/22/2021   10:34 AM 10/12/2021    8:49 AM 10/11/2021   11:33 AM  Vitals with BMI  Height 5' 5.5" 5' 5.5" 5' 5.5"  Weight 237 lbs 230 lbs 236 lbs  BMI 38.82 337.29302.11 Systolic [id number]  Diastolic 54 52   Pulse 67 66      Physical Exam Constitutional:      Comments: Morbidly Obese  Neck:  Vascular: No carotid bruit or JVD.     Comments: Short neck difficult to evaluate JVP Cardiovascular:     Rate and Rhythm: Normal rate and regular rhythm.     Pulses: Normal pulses and intact distal pulses.          Dorsalis pedis pulses are 2+ on the right side and 2+ on the left side.       Posterior tibial pulses are 2+ on the right side and 2+ on the left side.     Heart sounds: S1 normal and S2 normal. Heart sounds are distant. No murmur heard.    No gallop.  Pulmonary:     Effort: Pulmonary effort is normal. No accessory muscle usage or respiratory distress.     Breath sounds: Normal breath sounds.  Abdominal:     General: Bowel sounds are normal.     Palpations: Abdomen is soft.     Comments: Obese. Pannus present   Musculoskeletal:     Right lower leg: Edema present.     Left lower leg: Edema present.   Neurological:     Mental Status: He is alert.    Laboratory examination:   Recent Labs    02/28/21 1203 03/13/21 0343 04/12/21 1126 07/13/21 1050 10/12/21 0944  NA 136 134* 139 139 138  K 4.8 5.6* 5.2 4.8 4.9  CL 105 104 105 107 105  CO2 26 21* _0 GLUCOSE 113* 147* 119* 126* 105*  BUN 27* 30* 30* 34* 43*  CREATININE 1.06 1.07 1.18 1.35 1.46  CALCIUM 9.6 9.1 9.7 10.0 10.0  GFRNONAA >60 >60  --   --   --    estimated creatinine clearance is 41.4 mL/min (by C-G formula based on SCr of 1.46 mg/dL).     Latest Ref Rng & Units 10/12/2021    9:44 AM 07/13/2021   10:50 AM 04/12/2021   11:26 AM  CMP  Glucose 70 - 99 mg/dL 105  126  119   BUN 6 - 23 mg/dL 43  34  30   Creatinine 0.40 - 1.50 mg/dL 1.46  1.35  1.18   Sodium 135 - 145 mEq/L 138  139  139   Potassium 3.5 - 5.1 mEq/L 4.9  4.8  5.2   Chloride 96 - 112 mEq/L 105  107  105   CO2 19 - 32 mEq/L _1 Calcium 8.4 - 10.5 mg/dL 10.0  10.0  9.7   Total Protein 6.0 - 8.3 g/dL  6.9    Total Bilirubin 0.2 - 1.2 mg/dL  0.6    Alkaline Phos 39 - 117 U/L  64    AST 0 - 37 U/L  21    ALT 0 - 53 U/L  15        Latest Ref Rng & Units 10/12/2021    9:44 AM 07/13/2021   10:50 AM 04/12/2021   11:26 AM  CBC  WBC 4.0 - 10.5 K/uL 5.4  4.5  7.0   Hemoglobin 13.0 - 17.0 g/dL 12.2  11.6  9.7   Hematocrit 39.0 - 52.0 % 35.7  34.8  28.8   Platelets 150.0 - 400.0 K/uL 131.0  128.0  205    BNP (last 3 results) No results for input(s): "BNP" in the last 8760 hours.  Lipid Panel No results for input(s): "CHOL", "TRIG", "[place]", "VLDL", "HDL", "CHOLHDL", "LDLDIRECT" in the last 8760 hours.  Medications and allergies   Allergies  Allergen  Reactions   Amlodipine Swelling   Ciprofloxacin Nausea Only   Hydralazine Other (See Comments)    Leg edema   Hydrocodone Nausea Only    Ok taking in low doses($RemoveBefo'5mg'[id number]$ ), has nausea with higher doses   Levofloxacin Other (See Comments)    Pain/cramping    Oxycodone Nausea Only    Tramadol Nausea Only   Cardizem [Diltiazem Hcl] Rash     Current Outpatient Medications:    acetaminophen (TYLENOL) 650 MG CR tablet, Take 650 mg by mouth every 8 (eight) hours as needed for pain., Disp: , Rfl:    allopurinol (ZYLOPRIM) 300 MG tablet, Take 1 tablet (300 mg total) by mouth daily., Disp: 90 tablet, Rfl: 1   aspirin EC 81 MG tablet, Take 81 mg by mouth daily. Swallow whole., Disp: , Rfl:    cetirizine (ZYRTEC) 10 MG tablet, Take 10 mg by mouth daily as needed for allergies., Disp: , Rfl:    cromolyn (OPTICROM) 4 % ophthalmic solution, Place 1 drop into both eyes in the morning and at bedtime., Disp: , Rfl: 2   docusate sodium (COLACE) 100 MG capsule, Take 100 mg by mouth daily as needed for moderate constipation., Disp: , Rfl:    fluticasone (FLONASE) 50 MCG/ACT nasal spray, Place 1 spray into both nostrils daily as needed for allergies., Disp: , Rfl:    hydrochlorothiazide (HYDRODIURIL) 25 MG tablet, TAKE 1 TABLET BY MOUTH EVERY DAY, Disp: 90 tablet, Rfl: 1   isosorbide mononitrate (IMDUR) 60 MG 24 hr tablet, Take 1 tablet (60 mg total) by mouth daily., Disp: 90 tablet, Rfl: 3   labetalol (NORMODYNE) 200 MG tablet, TAKE 1 TABLET BY MOUTH TWICE A DAY, Disp: 180 tablet, Rfl: 1   levothyroxine (SYNTHROID, LEVOTHROID) 100 MCG tablet, Take 100 mcg by mouth daily before breakfast., Disp: , Rfl:    losartan (COZAAR) 100 MG tablet, TAKE 1 TABLET BY MOUTH EVERY DAY, Disp: 90 tablet, Rfl: 1   methocarbamol (ROBAXIN) 500 MG tablet, Take 1 tablet (500 mg total) by mouth every 6 (six) hours as needed for muscle spasms., Disp: 40 tablet, Rfl: 0   Multiple Vitamin (MULTIVITAMIN) tablet, Take 1 tablet by mouth daily., Disp: , Rfl:    Multiple Vitamins-Minerals (PRESERVISION AREDS PO), Take 1 capsule by mouth in the morning and at bedtime., Disp: , Rfl:    nitroGLYCERIN (NITROSTAT) 0.4 MG SL tablet, Place 1 tablet (0.4 mg total) under the tongue every 5 (five) minutes as needed for chest pain. If  your symptoms continue after taking two tablets 5 minutes apart call 911., Disp: 90 tablet, Rfl: 3   Omega-3 Fatty Acids (FISH OIL) 1000 MG CAPS, Take 1 capsule by mouth daily at 12 noon., Disp: , Rfl:    Polyethyl Glycol-Propyl Glycol (SYSTANE OP), Place 1 drop into both eyes daily as needed (dry eyes)., Disp: , Rfl:    rosuvastatin (CRESTOR) 20 MG tablet, Take 1 tablet (20 mg total) by mouth at bedtime., Disp: 90 tablet, Rfl: 1   spironolactone (ALDACTONE) 25 MG tablet, TAKE 1 TABLET (25 MG TOTAL) BY MOUTH IN THE MORNING., Disp: 90 tablet, Rfl: 2   testosterone cypionate (DEPOTESTOSTERONE CYPIONATE) 200 MG/ML injection, Inject 1 mL (200 mg total) into the muscle every 28 (twenty-eight) days., Disp: 1 mL, Rfl: 0   Vitamin D, Ergocalciferol, (DRISDOL) 1.25 MG (50000 UNIT) CAPS capsule, Take 50,000 Units by mouth once a week., Disp: , Rfl:   Radiology:   Cardiac Studies:   Cardiac Cath: 04/05/2010: 3 Vessel CAD;  RCA 99% mid; Mid Cx; 80%, Ostial LAD 80%, Mid 90% S/P CABG  x5  04/09/10  LIMA to the LAD, SVG to D1, SVG to OM1 & OM3, SVG to Rt PDA.  Carotid duplex 09/07/2012: No evidence of hemodynamically significant stenosis in the bilateral carotid bifurcation vessels.  There is evidence of heterogeneous plaque in the bilateral carotid artery.  Mild bilateral intimal thickening. Left carotid endarterectomy is widely patient. No significant change since 09/14/11.  Echo 04/07/2019: LVEF 55 to 60%, Moderate LVH, indeterminate diastolic filling pattern, moderately dilated left atrium, mild MR, mild TR, mild PR.  Lexiscan Tetrofosmin Stress Test  04/12/2019: Nondiagnostic ECG stress. Resting EKG demonstrated normal sinus rhythm, high lateral infarct, old. No ST-T wave abnormalities.   Left ventricle is normal in is mildly enlarged in both rest and stress images.  There is moderate diaphragmatic attenuation artifact in the inferior wall.  A very small sized mid inferolateral nontransmural scar cannot be  completely excluded. Dynamic gated images reveal normal wall motion, stress LVEF was 60%. Low risk study.   EKG  EKG 10/22/2021: Sinus rhythm with first-degree block at rate of 68 bpm, leftward enlargement, poor R progression, probably normal variant but cannot exclude anteroseptal infarct old.  No evidence of ischemia.  Assessment     ICD-10-CM   1. Coronary artery disease involving native coronary artery of native heart without angina pectoris  I25.10 EKG 12-Lead    PCV CAROTID DUPLEX (BILATERAL)    2. HTN (hypertension), benign  I10 isosorbide mononitrate (IMDUR) 60 MG 24 hr tablet    PCV CAROTID DUPLEX (BILATERAL)    3. Mixed hyperlipidemia  E78.2 PCV CAROTID DUPLEX (BILATERAL)    Lipid Panel With LDL/HDL Ratio      Meds ordered this encounter  Medications   isosorbide mononitrate (IMDUR) 60 MG 24 hr tablet    Sig: Take 1 tablet (60 mg total) by mouth daily.    Dispense:  90 tablet    Refill:  3    Order Specific Question:   Supervising Provider    Answer:   Adrian Prows [[id number]]    Medications Discontinued During This Encounter  Medication Reason   isosorbide dinitrate (ISORDIL) 30 MG tablet Change in therapy    Orders Placed This Encounter  Procedures   Lipid Panel With LDL/HDL Ratio   EKG 12-Lead    Recommendations:   JARTAVIOUS MCKIMMY  is a 88 y.o. Caucasian male with coronary disease with CABG in 2012, hypertension, mixed hyperlipidemia, hyperglycemia, h/o gout, OSA on CPAP, former smoker, morbid obesity, multiple myeloma in regression who presents to the office for 28-monthfollow-up of hypertension, hyperlipidemia and coronary artery disease. He underwent right knee replacement on 03/12/2021, he still recuperating from this and still has edema and pain.  No periprocedural complications from cardiac standpoint.  He remains angina free and there is no clinical evidence of heart failure.  Blood pressure is now well controlled on labetalol and EKG reveals no changes from  previous and also no heart block.  Lipids are well controlled.    Given blurred vision symptoms, will stop Isosorbide dinitrate and start Isosorbide mononitrate 637mdaily.  Will order lipid panel, last LDL 10/20/20 was 50. Continue Crestor 2067maily. Weight loss again discussed, he has lost about 30 pounds in weight overall but has gained about 4 to 5 pounds since last office visit 6 months ago. Discussed continuing weight loss efforts and exercise as tolerated.  His last carotid duplex was in 2014, will order  carotid duplex to be completed prior to next visit.  He does have a new PCP, [employer]. DNP.  Follow-up in 1 year or sooner if needed.   Ernst Spell, NP 10/22/2021, 1:04 PM Office: [phone]

## 2021-10-24 NOTE — Telephone Encounter (Signed)
Testosterone medication pulled down for refills[facility] Dr Nadyne Coombes notes are in epic

## 2021-10-25 MED ORDER — TESTOSTERONE CYPIONATE 200 MG/ML IM SOLN: 200.0000 mg | INTRAMUSCULAR | 0 refills | Status: DC

## 2021-11-08 ENCOUNTER — Other Ambulatory Visit: Payer: Self-pay | Admitting: Nurse Practitioner

## 2021-11-08 DIAGNOSIS — E291 Testicular hypofunction: Secondary | ICD-10-CM

## 2021-11-08 NOTE — Telephone Encounter (Signed)
Can we verify if he is taking the vitamin D once a week or once a day. If he has been taking it daily we need to check his vitamin D level

## 2021-11-09 MED ORDER — VITAMIN D (ERGOCALCIFEROL) 1.25 MG (50000 UNIT) PO CAPS: 50000.0000 [IU] | ORAL_CAPSULE | ORAL | 2 refills | Status: DC

## 2021-11-09 NOTE — Telephone Encounter (Signed)
Spoke with [name] [name], patient takes Vitamin D 50,000 units once a week. Has been on this routine for years

## 2021-11-19 ENCOUNTER — Encounter: Payer: Self-pay | Admitting: Nurse Practitioner

## 2021-11-20 ENCOUNTER — Ambulatory Visit: Payer: Medicare PPO

## 2021-11-20 DIAGNOSIS — I10 Essential (primary) hypertension: Secondary | ICD-10-CM

## 2021-11-20 DIAGNOSIS — I2510 Atherosclerotic heart disease of native coronary artery without angina pectoris: Secondary | ICD-10-CM

## 2021-11-20 DIAGNOSIS — E782 Mixed hyperlipidemia: Secondary | ICD-10-CM

## 2021-11-23 ENCOUNTER — Ambulatory Visit (INDEPENDENT_AMBULATORY_CARE_PROVIDER_SITE_OTHER): Payer: Medicare PPO

## 2021-11-23 DIAGNOSIS — E291 Testicular hypofunction: Secondary | ICD-10-CM | POA: Diagnosis not present

## 2021-11-23 MED ORDER — TESTOSTERONE CYPIONATE 200 MG/ML IM SOLN: 200.0000 mg | INTRAMUSCULAR | 0 refills | Status: DC

## 2021-11-23 MED ORDER — TESTOSTERONE CYPIONATE 200 MG/ML IM SOLN
200.0000 mg | INTRAMUSCULAR | Status: AC
  Administered 2021-11-23 – 2022-01-25 (×2): 200 mg via INTRAMUSCULAR

## 2021-11-23 NOTE — Progress Notes (Signed)
Per orders of Romilda Garret, NP injection of Testosterone Cypionate given by Loreen Freud. Patient tolerated injection well.

## 2021-12-25 ENCOUNTER — Ambulatory Visit (INDEPENDENT_AMBULATORY_CARE_PROVIDER_SITE_OTHER): Payer: Medicare PPO

## 2021-12-25 DIAGNOSIS — E291 Testicular hypofunction: Secondary | ICD-10-CM

## 2021-12-25 MED ORDER — TESTOSTERONE CYPIONATE 200 MG/ML IM SOLN
200.0000 mg | Freq: Once | INTRAMUSCULAR | Status: AC
  Administered 2021-12-25: 200 mg via INTRAMUSCULAR

## 2021-12-25 NOTE — Progress Notes (Signed)
Per orders of  Romilda Garret NP, injection of Testosterone cypionate 200 mg given in left ventrogluteal given by Ozzie Hoyle. Patient tolerated injection well.

## 2022-01-16 ENCOUNTER — Other Ambulatory Visit: Payer: Self-pay | Admitting: Nurse Practitioner

## 2022-01-16 DIAGNOSIS — D472 Monoclonal gammopathy: Secondary | ICD-10-CM

## 2022-01-25 ENCOUNTER — Other Ambulatory Visit: Payer: Self-pay

## 2022-01-25 ENCOUNTER — Ambulatory Visit (INDEPENDENT_AMBULATORY_CARE_PROVIDER_SITE_OTHER): Payer: Medicare PPO

## 2022-01-25 DIAGNOSIS — E291 Testicular hypofunction: Secondary | ICD-10-CM

## 2022-01-25 MED ORDER — TESTOSTERONE CYPIONATE 200 MG/ML IM SOLN: 200.0000 mg | INTRAMUSCULAR | 0 refills | Status: DC

## 2022-01-25 MED ORDER — TESTOSTERONE CYPIONATE 200 MG/ML IM SOLN
200.0000 mg | INTRAMUSCULAR | Status: AC
  Administered 2022-02-26: 200 mg via INTRAMUSCULAR

## 2022-01-25 NOTE — Telephone Encounter (Signed)
Pt request refill on testosterone cypionate 200 mg/ml Last refilled 3 ml on 10/25/21 Pt last seen 10/12/2021 Pt next appt 04/12/2022 for [facility] [place] Sending request to Romilda Garret NP.

## 2022-01-25 NOTE — Progress Notes (Signed)
Per orders of  Michela Pitcher NP, injection of testosterone cypionate 200 mg given by Ozzie Hoyle. Patient tolerated injection well.

## 2022-02-07 ENCOUNTER — Other Ambulatory Visit: Payer: Self-pay | Admitting: Nurse Practitioner

## 2022-02-07 DIAGNOSIS — I2510 Atherosclerotic heart disease of native coronary artery without angina pectoris: Secondary | ICD-10-CM

## 2022-02-18 ENCOUNTER — Other Ambulatory Visit: Payer: Self-pay | Admitting: Nurse Practitioner

## 2022-02-18 DIAGNOSIS — E291 Testicular hypofunction: Secondary | ICD-10-CM

## 2022-02-20 MED ORDER — TESTOSTERONE CYPIONATE 200 MG/ML IM SOLN: 200.0000 mg | INTRAMUSCULAR | 0 refills | Status: DC

## 2022-02-26 ENCOUNTER — Ambulatory Visit (INDEPENDENT_AMBULATORY_CARE_PROVIDER_SITE_OTHER): Payer: Medicare PPO

## 2022-02-26 ENCOUNTER — Telehealth: Payer: Self-pay | Admitting: Nurse Practitioner

## 2022-02-26 DIAGNOSIS — E291 Testicular hypofunction: Secondary | ICD-10-CM | POA: Diagnosis not present

## 2022-02-26 MED ORDER — TESTOSTERONE CYPIONATE 200 MG/ML IM SOLN: 200.0000 mg | INTRAMUSCULAR | Status: AC

## 2022-02-26 NOTE — Progress Notes (Signed)
Per orders of Karl Ito, NP , injection of Testosterone given by Barkley Bruns in left upper outer quadrant . Patient tolerated injection well.

## 2022-02-26 NOTE — Telephone Encounter (Signed)
Pt asked if he could get his next test inj during his cpe on 04/12/22 or should he return before March for inj? Pt had a inj comp today, 1/30. Instructions states inj should be comp every 30 days. Call back # [phone]

## 2022-02-26 NOTE — Telephone Encounter (Signed)
Called patient spoke to wife on dpr gave information. Will call if any questions.

## 2022-02-26 NOTE — Telephone Encounter (Signed)
It is fine to wait until his CPE

## 2022-02-27 ENCOUNTER — Other Ambulatory Visit: Payer: Self-pay | Admitting: Cardiology

## 2022-02-27 ENCOUNTER — Other Ambulatory Visit: Payer: Self-pay | Admitting: Nurse Practitioner

## 2022-03-03 ENCOUNTER — Other Ambulatory Visit: Payer: Self-pay | Admitting: Cardiology

## 2022-03-03 DIAGNOSIS — I10 Essential (primary) hypertension: Secondary | ICD-10-CM

## 2022-04-12 ENCOUNTER — Ambulatory Visit (INDEPENDENT_AMBULATORY_CARE_PROVIDER_SITE_OTHER): Payer: Medicare PPO | Admitting: Nurse Practitioner

## 2022-04-12 ENCOUNTER — Encounter: Payer: Self-pay | Admitting: Nurse Practitioner

## 2022-04-12 VITALS — BP 138/62 | HR 59 | Temp 97.7°F | Resp 16 | Ht 64.5 in | Wt 235.1 lb

## 2022-04-12 DIAGNOSIS — E291 Testicular hypofunction: Secondary | ICD-10-CM

## 2022-04-12 DIAGNOSIS — Z0000 Encounter for general adult medical examination without abnormal findings: Secondary | ICD-10-CM

## 2022-04-12 DIAGNOSIS — E119 Type 2 diabetes mellitus without complications: Secondary | ICD-10-CM | POA: Diagnosis not present

## 2022-04-12 DIAGNOSIS — E039 Hypothyroidism, unspecified: Secondary | ICD-10-CM | POA: Insufficient documentation

## 2022-04-12 DIAGNOSIS — E669 Obesity, unspecified: Secondary | ICD-10-CM | POA: Diagnosis not present

## 2022-04-12 DIAGNOSIS — E559 Vitamin D deficiency, unspecified: Secondary | ICD-10-CM | POA: Diagnosis not present

## 2022-04-12 LAB — COMPREHENSIVE METABOLIC PANEL
ALT: 20 U/L (ref 0–53)
AST: 26 U/L (ref 0–37)
Albumin: 3.9 g/dL (ref 3.5–5.2)
Alkaline Phosphatase: 63 U/L (ref 39–117)
BUN: 36 mg/dL — ABNORMAL HIGH (ref 6–23)
CO2: 26 mEq/L (ref 19–32)
Calcium: 9.3 mg/dL (ref 8.4–10.5)
Chloride: 107 mEq/L (ref 96–112)
Creatinine, Ser: 1.21 mg/dL (ref 0.40–1.50)
GFR: 53.89 mL/min — ABNORMAL LOW (ref >60.00)
Glucose, Bld: 102 mg/dL — ABNORMAL HIGH (ref 70–99)
Potassium: 4.8 mEq/L (ref 3.5–5.1)
Sodium: 139 mEq/L (ref 135–145)
Total Bilirubin: 0.8 mg/dL (ref 0.2–1.2)
Total Protein: 6.7 g/dL (ref 6.0–8.3)

## 2022-04-12 LAB — CBC
HCT: 39.4 % (ref 39.0–52.0)
Hemoglobin: 13.2 g/dL (ref 13.0–17.0)
MCHC: 33.6 g/dL (ref 30.0–36.0)
MCV: 100.7 fl — ABNORMAL HIGH (ref 78.0–100.0)
Platelets: 109 10*3/uL — ABNORMAL LOW (ref 150.0–400.0)
RBC: 3.91 Mil/uL — ABNORMAL LOW (ref 4.22–5.81)
RDW: 16.1 % — ABNORMAL HIGH (ref 11.5–15.5)
WBC: 5.4 10*3/uL (ref 4.0–10.5)

## 2022-04-12 LAB — TESTOSTERONE: Testosterone: 229.72 ng/dL — ABNORMAL LOW (ref 300.00–890.00)

## 2022-04-12 LAB — TSH: TSH: 1.76 u[IU]/mL (ref 0.35–5.50)

## 2022-04-12 LAB — LIPID PANEL
Cholesterol: 94 mg/dL (ref 0–200)
HDL: 33.6 mg/dL — ABNORMAL LOW (ref >39.00)
LDL Cholesterol: 40 mg/dL (ref 0–99)
NonHDL: 60.31
Total CHOL/HDL Ratio: 3
Triglycerides: 101 mg/dL (ref 0.0–149.0)
VLDL: 20.2 mg/dL (ref 0.0–40.0)

## 2022-04-12 LAB — VITAMIN D 25 HYDROXY (VIT D DEFICIENCY, FRACTURES): VITD: 57.75 ng/mL (ref 30.00–100.00)

## 2022-04-12 MED ORDER — LEVOTHYROXINE SODIUM 100 MCG PO TABS: 100.0000 ug | ORAL_TABLET | Freq: Every day | ORAL | 3 refills | Status: DC

## 2022-04-12 MED ORDER — METHOCARBAMOL 500 MG PO TABS: 500.0000 mg | ORAL_TABLET | Freq: Four times a day (QID) | ORAL | 0 refills | Status: DC | PRN

## 2022-04-12 MED ORDER — TESTOSTERONE CYPIONATE 200 MG/ML IM SOLN
200.0000 mg | INTRAMUSCULAR | Status: AC
Start: 2022-04-12 — End: ?
  Administered 2022-04-12 – 2022-05-21 (×2): 200 mg via INTRAMUSCULAR

## 2022-04-12 NOTE — Patient Instructions (Signed)
Get a tetanus vaccine at your local pharmacy Consider getting the Shingrix vaccines Follow up with me in 6 months, sooner if you need me  I will be in touch with the labs once I have them

## 2022-04-12 NOTE — Assessment & Plan Note (Signed)
Patient currently maintained on lifestyle modifications alone.  Pending A1c today.

## 2022-04-12 NOTE — Progress Notes (Signed)
Established Patient Office Visit  Subjective   Patient ID: [name] [name], male    DOB: 04/17/1932  Age: 89 y.o. MRN: [id number]  Chief Complaint  Patient presents with   Annual Exam    HPI  CAD: once a year is followed by Dr. Nadyne Coombes  DM2: Patient currently maintained on diet and lifestyle modifications alone.  Not on any antidiabetic medications.  Hypogonadism: currently on testosterone once a month.  Last testosterone was below normal limits.  Patient would like his testosterone injection given today  for complete physical and follow up of chronic conditions.  Immunizations: -Tetanus: update at pharmcy -Influenza: up to date  -Shingles: needs updating, will update at local pharmacy -Pneumonia: Completed   Diet: Fair diet. 2-3 meals a day. Coffee and water. No sodas Exercise: No regular exercise. Working a lot around [employer] exam: Completes annually Glasses and has appointment next month Dental exam: Completes semi-annually    Colonoscopy: aged out.  Last colonoscopy was 2014 that showed no polyps.  If patient decides he would like to pursue a colonoscopy he can let me know Lung Cancer Screening: does not qualify   PSA: Aged out  Advance directive: HPOA and living will  Back pain: states that he was sitting down eating and the pain hit all of a sudden. States that it is right sided and it is intermittent and not sure what causes it. States that he can ride the [employer] and it did not bother him. States that it will go down the cheek and go into the groin        Review of Systems  Constitutional:  Negative for chills and fever.  Respiratory:  Negative for shortness of breath.   Cardiovascular:  Negative for chest pain and leg swelling.  Gastrointestinal:  Negative for abdominal pain, blood in stool, constipation, diarrhea, nausea and vomiting.       BM daily   Genitourinary:  Negative for dysuria and hematuria.  Neurological:  Negative for tingling and  headaches.  Psychiatric/Behavioral:  Negative for hallucinations and suicidal ideas.       Objective:     BP 138/62   Pulse (!) 59   Temp 97.7 F (36.5 C)   Resp 16   Ht 5' 4.5" (1.638 m)   Wt 235 lb 2 oz (106.7 kg)   SpO2 97%   BMI 39.74 kg/m  BP Readings from Last 3 Encounters:  04/12/22 138/62  10/22/21 (!) 133/54  10/12/21 (!) 118/52   Wt Readings from Last 3 Encounters:  04/12/22 235 lb 2 oz (106.7 kg)  10/22/21 237 lb (107.5 kg)  10/12/21 230 lb (104.3 kg)      Physical Exam Vitals and nursing note reviewed.  Constitutional:      Appearance: Normal appearance.  HENT:     Right Ear: Tympanic membrane, ear canal and external ear normal.     Left Ear: Tympanic membrane, ear canal and external ear normal.     Mouth/Throat:     Mouth: Mucous membranes are moist.     Pharynx: Oropharynx is clear.  Eyes:     Extraocular Movements: Extraocular movements intact.     Pupils: Pupils are equal, round, and reactive to light.  Cardiovascular:     Rate and Rhythm: Normal rate and regular rhythm.     Pulses: Normal pulses.     Heart sounds: Normal heart sounds.  Pulmonary:     Effort: Pulmonary effort is normal.  Breath sounds: Normal breath sounds.  Abdominal:     General: Bowel sounds are normal. There is no distension.     Palpations: There is no mass.     Tenderness: There is no abdominal tenderness.     Hernia: No hernia is present.  Musculoskeletal:        General: No tenderness.     Lumbar back: No tenderness or bony tenderness. Negative right straight leg raise test and negative left straight leg raise test.     Right lower leg: No edema.     Left lower leg: No edema.  Lymphadenopathy:     Cervical: No cervical adenopathy.  Skin:    General: Skin is warm.  Neurological:     General: No focal deficit present.     Mental Status: He is alert.     Deep Tendon Reflexes:     Reflex Scores:      Bicep reflexes are 2+ on the right side and 2+ on the left  side.      Patellar reflexes are 2+ on the right side and 2+ on the left side.    Comments: Bilateral upper and lower extremity strength 5/5  Psychiatric:        Mood and Affect: Mood normal.        Behavior: Behavior normal.        Thought Content: Thought content normal.        Judgment: Judgment normal.      No results found for any visits on 04/12/22.    The ASCVD Risk score (Arnett DK, et al., 2019) failed to calculate for the following reasons:   The 2019 ASCVD risk score is only valid for ages 69 to 41    Assessment & Plan:   Problem List Items Addressed This Visit       Endocrine   Hypogonadism in male    Check testosterone with CBC.  Patient would like his testosterone injection today.      Relevant Medications   testosterone cypionate (DEPOTESTOSTERONE CYPIONATE) injection 200 mg   Other Relevant Orders   Testosterone   Type 2 diabetes mellitus without complication, without long-term current use of insulin ([place])    Patient currently maintained on lifestyle modifications alone.  Pending A1c today.      Relevant Orders   CBC   Comprehensive metabolic panel   Lipid panel   Hypothyroidism    Patient currently maintained on levothyroxine 100 mcg.  Pending TSH today.  Refill provided      Relevant Medications   levothyroxine (SYNTHROID) 100 MCG tablet   Other Relevant Orders   TSH     Other   Preventative health care - Primary    Discussed age-appropriate immunizations and screening exams.  Did review all of patient's histories.  Patient is up-to-date on flu vaccine and COVID-vaccine.  He will update his tetanus and shingles vaccine at local pharmacy.  Patient was given information at discharge about preventative healthcare maintenance with anticipatory guidance for his age range.  Patient is aged out of CRC and PSA screening      Obesity (BMI 30-39.9)    Patient will be working on the yard more now that is following up.      Relevant Orders   TSH    Lipid panel   Vitamin D deficiency    Has been on prescription replacement in the past pending level today      Relevant Orders   VITAMIN D 25 Hydroxy (  Vit-D Deficiency, Fractures)    Return in about 6 months (around 10/13/2022) for DM recheck.    Romilda Garret, NP

## 2022-04-12 NOTE — Assessment & Plan Note (Signed)
Discussed age-appropriate immunizations and screening exams.  Did review all of patient's histories.  Patient is up-to-date on flu vaccine and COVID-vaccine.  He will update his tetanus and shingles vaccine at local pharmacy.  Patient was given information at discharge about preventative healthcare maintenance with anticipatory guidance for his age range.  Patient is aged out of CRC and PSA screening

## 2022-04-12 NOTE — Assessment & Plan Note (Signed)
Patient currently maintained on levothyroxine 100 mcg.  Pending TSH today.  Refill provided

## 2022-04-12 NOTE — Assessment & Plan Note (Signed)
Patient will be working on the yard more now that is following up.

## 2022-04-12 NOTE — Assessment & Plan Note (Signed)
Check testosterone with CBC.  Patient would like his testosterone injection today.

## 2022-04-12 NOTE — Addendum Note (Signed)
Addended by: Michela Pitcher on: 04/12/2022 01:10 PM   Modules accepted: Level of Service

## 2022-04-12 NOTE — Assessment & Plan Note (Signed)
Has been on prescription replacement in the past pending level today

## 2022-04-15 ENCOUNTER — Other Ambulatory Visit: Payer: Self-pay | Admitting: Nurse Practitioner

## 2022-04-15 DIAGNOSIS — D696 Thrombocytopenia, unspecified: Secondary | ICD-10-CM

## 2022-04-16 ENCOUNTER — Other Ambulatory Visit: Payer: Self-pay | Admitting: Cardiology

## 2022-04-17 ENCOUNTER — Telehealth: Payer: Self-pay

## 2022-04-17 ENCOUNTER — Other Ambulatory Visit: Payer: Self-pay

## 2022-04-17 MED ORDER — LOSARTAN POTASSIUM 100 MG PO TABS: 100.0000 mg | ORAL_TABLET | Freq: Every day | ORAL | 1 refills | Status: DC

## 2022-04-17 NOTE — Telephone Encounter (Signed)
Error

## 2022-05-07 ENCOUNTER — Other Ambulatory Visit: Payer: Self-pay | Admitting: Cardiology

## 2022-05-07 DIAGNOSIS — I10 Essential (primary) hypertension: Secondary | ICD-10-CM

## 2022-05-21 ENCOUNTER — Other Ambulatory Visit: Payer: Self-pay

## 2022-05-21 ENCOUNTER — Ambulatory Visit (INDEPENDENT_AMBULATORY_CARE_PROVIDER_SITE_OTHER): Payer: Medicare PPO

## 2022-05-21 ENCOUNTER — Other Ambulatory Visit: Payer: Medicare PPO

## 2022-05-21 DIAGNOSIS — E291 Testicular hypofunction: Secondary | ICD-10-CM

## 2022-05-21 NOTE — Telephone Encounter (Signed)
Prescription Request  05/21/2022  LOV: 04/12/2022 annual exam  What is the name of the medication or equipment? Testosterone 200 mg/ ml  Have you contacted your pharmacy to request a refill? No   Which pharmacy would you like this sent to?  [id number] [place], [place] - [address] [place] [address] Phone: [phone] Fax: [phone]    Patient notified that their request is being sent to the clinical staff for review and that they should receive a response within 2 business days.   Please advise at [facility] [phone]   Pt was in office today for nurse visit for testosterone 200 mg IM which was given and pt needs refill of Testosterone 200 mg/ml to be given IM q 28 days. [address] Sending refill request to Audria Nine NP.

## 2022-05-21 NOTE — Progress Notes (Signed)
Per orders of Peyton Bottoms NP, injection of Testosterone 200 mg given IM in RUOQ given by Lewanda Rife. Patient tolerated injection well.

## 2022-05-22 DIAGNOSIS — D0359 Melanoma in situ of other part of trunk: Secondary | ICD-10-CM | POA: Diagnosis not present

## 2022-05-22 DIAGNOSIS — Z1283 Encounter for screening for malignant neoplasm of skin: Secondary | ICD-10-CM | POA: Diagnosis not present

## 2022-05-22 DIAGNOSIS — D225 Melanocytic nevi of trunk: Secondary | ICD-10-CM | POA: Diagnosis not present

## 2022-05-22 DIAGNOSIS — X32XXXD Exposure to sunlight, subsequent encounter: Secondary | ICD-10-CM | POA: Diagnosis not present

## 2022-05-22 DIAGNOSIS — L570 Actinic keratosis: Secondary | ICD-10-CM | POA: Diagnosis not present

## 2022-05-22 DIAGNOSIS — D485 Neoplasm of uncertain behavior of skin: Secondary | ICD-10-CM | POA: Diagnosis not present

## 2022-05-22 MED ORDER — TESTOSTERONE CYPIONATE 200 MG/ML IM SOLN: 200.0000 mg | INTRAMUSCULAR | 0 refills | Status: DC

## 2022-05-23 NOTE — Telephone Encounter (Signed)
Per DPR left v/m for pt that med requested for refill was [facility] [address].

## 2022-05-31 DIAGNOSIS — C4359 Malignant melanoma of other part of trunk: Secondary | ICD-10-CM | POA: Diagnosis not present

## 2022-05-31 DIAGNOSIS — D0359 Melanoma in situ of other part of trunk: Secondary | ICD-10-CM | POA: Diagnosis not present

## 2022-06-10 ENCOUNTER — Other Ambulatory Visit: Payer: Self-pay | Admitting: Nurse Practitioner

## 2022-06-10 DIAGNOSIS — D485 Neoplasm of uncertain behavior of skin: Secondary | ICD-10-CM | POA: Diagnosis not present

## 2022-06-12 DIAGNOSIS — M961 Postlaminectomy syndrome, not elsewhere classified: Secondary | ICD-10-CM | POA: Diagnosis not present

## 2022-06-12 DIAGNOSIS — M5451 Vertebrogenic low back pain: Secondary | ICD-10-CM | POA: Diagnosis not present

## 2022-06-17 DIAGNOSIS — J309 Allergic rhinitis, unspecified: Secondary | ICD-10-CM | POA: Diagnosis not present

## 2022-06-17 DIAGNOSIS — Z823 Family history of stroke: Secondary | ICD-10-CM | POA: Diagnosis not present

## 2022-06-17 DIAGNOSIS — G2581 Restless legs syndrome: Secondary | ICD-10-CM | POA: Diagnosis not present

## 2022-06-17 DIAGNOSIS — Z8249 Family history of ischemic heart disease and other diseases of the circulatory system: Secondary | ICD-10-CM | POA: Diagnosis not present

## 2022-06-17 DIAGNOSIS — R011 Cardiac murmur, unspecified: Secondary | ICD-10-CM | POA: Diagnosis not present

## 2022-06-17 DIAGNOSIS — C9000 Multiple myeloma not having achieved remission: Secondary | ICD-10-CM | POA: Diagnosis not present

## 2022-06-17 DIAGNOSIS — I130 Hypertensive heart and chronic kidney disease with heart failure and stage 1 through stage 4 chronic kidney disease, or unspecified chronic kidney disease: Secondary | ICD-10-CM | POA: Diagnosis not present

## 2022-06-17 DIAGNOSIS — G4733 Obstructive sleep apnea (adult) (pediatric): Secondary | ICD-10-CM | POA: Diagnosis not present

## 2022-06-17 DIAGNOSIS — K5900 Constipation, unspecified: Secondary | ICD-10-CM | POA: Diagnosis not present

## 2022-06-17 DIAGNOSIS — M810 Age-related osteoporosis without current pathological fracture: Secondary | ICD-10-CM | POA: Diagnosis not present

## 2022-06-17 DIAGNOSIS — M1990 Unspecified osteoarthritis, unspecified site: Secondary | ICD-10-CM | POA: Diagnosis not present

## 2022-06-17 DIAGNOSIS — N182 Chronic kidney disease, stage 2 (mild): Secondary | ICD-10-CM | POA: Diagnosis not present

## 2022-06-17 DIAGNOSIS — E785 Hyperlipidemia, unspecified: Secondary | ICD-10-CM | POA: Diagnosis not present

## 2022-06-17 DIAGNOSIS — M545 Low back pain, unspecified: Secondary | ICD-10-CM | POA: Diagnosis not present

## 2022-06-17 DIAGNOSIS — Z87891 Personal history of nicotine dependence: Secondary | ICD-10-CM | POA: Diagnosis not present

## 2022-06-17 DIAGNOSIS — N529 Male erectile dysfunction, unspecified: Secondary | ICD-10-CM | POA: Diagnosis not present

## 2022-06-17 DIAGNOSIS — I25119 Atherosclerotic heart disease of native coronary artery with unspecified angina pectoris: Secondary | ICD-10-CM | POA: Diagnosis not present

## 2022-06-17 DIAGNOSIS — E039 Hypothyroidism, unspecified: Secondary | ICD-10-CM | POA: Diagnosis not present

## 2022-06-19 DIAGNOSIS — H0102B Squamous blepharitis left eye, upper and lower eyelids: Secondary | ICD-10-CM | POA: Diagnosis not present

## 2022-06-19 DIAGNOSIS — H0102A Squamous blepharitis right eye, upper and lower eyelids: Secondary | ICD-10-CM | POA: Diagnosis not present

## 2022-06-19 DIAGNOSIS — H43813 Vitreous degeneration, bilateral: Secondary | ICD-10-CM | POA: Diagnosis not present

## 2022-06-19 DIAGNOSIS — H40013 Open angle with borderline findings, low risk, bilateral: Secondary | ICD-10-CM | POA: Diagnosis not present

## 2022-06-19 DIAGNOSIS — Z961 Presence of intraocular lens: Secondary | ICD-10-CM | POA: Diagnosis not present

## 2022-06-19 DIAGNOSIS — D492 Neoplasm of unspecified behavior of bone, soft tissue, and skin: Secondary | ICD-10-CM | POA: Diagnosis not present

## 2022-06-19 DIAGNOSIS — H04123 Dry eye syndrome of bilateral lacrimal glands: Secondary | ICD-10-CM | POA: Diagnosis not present

## 2022-06-19 DIAGNOSIS — H524 Presbyopia: Secondary | ICD-10-CM | POA: Diagnosis not present

## 2022-06-19 DIAGNOSIS — H4912 Fourth [trochlear] nerve palsy, left eye: Secondary | ICD-10-CM | POA: Diagnosis not present

## 2022-07-04 DIAGNOSIS — M5416 Radiculopathy, lumbar region: Secondary | ICD-10-CM | POA: Diagnosis not present

## 2022-07-15 ENCOUNTER — Other Ambulatory Visit: Payer: Self-pay | Admitting: Nurse Practitioner

## 2022-07-15 DIAGNOSIS — D472 Monoclonal gammopathy: Secondary | ICD-10-CM

## 2022-07-16 ENCOUNTER — Other Ambulatory Visit (INDEPENDENT_AMBULATORY_CARE_PROVIDER_SITE_OTHER): Payer: Medicare PPO

## 2022-07-16 ENCOUNTER — Ambulatory Visit (INDEPENDENT_AMBULATORY_CARE_PROVIDER_SITE_OTHER): Payer: Medicare PPO

## 2022-07-16 DIAGNOSIS — D696 Thrombocytopenia, unspecified: Secondary | ICD-10-CM | POA: Diagnosis not present

## 2022-07-16 DIAGNOSIS — E291 Testicular hypofunction: Secondary | ICD-10-CM

## 2022-07-16 LAB — CBC
HCT: 37.7 % — ABNORMAL LOW (ref 39.0–52.0)
Hemoglobin: 12.6 g/dL — ABNORMAL LOW (ref 13.0–17.0)
MCHC: 33.5 g/dL (ref 30.0–36.0)
MCV: 103 fl — ABNORMAL HIGH (ref 78.0–100.0)
Platelets: 105 10*3/uL — ABNORMAL LOW (ref 150.0–400.0)
RBC: 3.66 Mil/uL — ABNORMAL LOW (ref 4.22–5.81)
RDW: 15.3 % (ref 11.5–15.5)
WBC: 5.7 10*3/uL (ref 4.0–10.5)

## 2022-07-16 MED ORDER — TESTOSTERONE CYPIONATE 200 MG/ML IM SOLN
200.0000 mg | INTRAMUSCULAR | Status: AC
  Administered 2022-07-16 – 2022-10-15 (×2): 200 mg via INTRAMUSCULAR

## 2022-07-16 NOTE — Progress Notes (Signed)
Per orders of Dr. Crawford Givens, injection of Testosterone given by Vertis Kelch in left upper outer quad . Patient tolerated injection well.

## 2022-07-19 DIAGNOSIS — M5451 Vertebrogenic low back pain: Secondary | ICD-10-CM | POA: Diagnosis not present

## 2022-08-05 ENCOUNTER — Other Ambulatory Visit: Payer: Self-pay | Admitting: Nurse Practitioner

## 2022-08-05 DIAGNOSIS — I2510 Atherosclerotic heart disease of native coronary artery without angina pectoris: Secondary | ICD-10-CM

## 2022-08-21 ENCOUNTER — Ambulatory Visit (INDEPENDENT_AMBULATORY_CARE_PROVIDER_SITE_OTHER): Payer: Medicare PPO

## 2022-08-21 DIAGNOSIS — E291 Testicular hypofunction: Secondary | ICD-10-CM | POA: Diagnosis not present

## 2022-08-21 MED ORDER — TESTOSTERONE CYPIONATE 200 MG/ML IM SOLN
200.0000 mg | Freq: Once | INTRAMUSCULAR | Status: AC
Start: 2022-08-21 — End: 2022-08-21
  Administered 2022-08-21: 200 mg via INTRAMUSCULAR

## 2022-08-21 NOTE — Progress Notes (Signed)
Per orders of Mordecai Maes, NP , injection of testosterone 200 mg given by Lewanda Rife in right deltoid. Patient tolerated injection well. Patient will make appointment for 1 month.

## 2022-08-22 ENCOUNTER — Other Ambulatory Visit: Payer: Self-pay | Admitting: Nurse Practitioner

## 2022-08-24 ENCOUNTER — Other Ambulatory Visit: Payer: Self-pay | Admitting: Nurse Practitioner

## 2022-08-27 ENCOUNTER — Telehealth: Payer: Self-pay | Admitting: Nurse Practitioner

## 2022-08-27 MED ORDER — METHOCARBAMOL 500 MG PO TABS: 500.0000 mg | ORAL_TABLET | Freq: Four times a day (QID) | ORAL | 0 refills | Status: DC | PRN

## 2022-08-27 NOTE — Telephone Encounter (Signed)
Refill has been sent to pharmacy. No further action needed at this time.

## 2022-08-27 NOTE — Telephone Encounter (Signed)
Prescription Request  08/27/2022  LOV: 04/12/2022  What is the name of the medication or equipment? methocarbamol (ROBAXIN) 500 MG tablet   Have you contacted your pharmacy to request a refill? Yes   Which pharmacy would you like this sent to?  [id number] [place], [place] - [address] [place] [address] Phone: [phone] Fax: [phone]    Patient notified that their request is being sent to the clinical staff for review and that they should receive a response within 2 business days.   Please advise at Mobile [phone] (mobile)  Patient's wife says he is completely out of this medication and is needing a refill

## 2022-08-28 ENCOUNTER — Encounter (INDEPENDENT_AMBULATORY_CARE_PROVIDER_SITE_OTHER): Payer: Self-pay

## 2022-08-30 DIAGNOSIS — L570 Actinic keratosis: Secondary | ICD-10-CM | POA: Diagnosis not present

## 2022-08-30 DIAGNOSIS — Z86006 Personal history of melanoma in-situ: Secondary | ICD-10-CM | POA: Diagnosis not present

## 2022-08-30 DIAGNOSIS — Z08 Encounter for follow-up examination after completed treatment for malignant neoplasm: Secondary | ICD-10-CM | POA: Diagnosis not present

## 2022-08-30 DIAGNOSIS — Z1283 Encounter for screening for malignant neoplasm of skin: Secondary | ICD-10-CM | POA: Diagnosis not present

## 2022-08-30 DIAGNOSIS — X32XXXD Exposure to sunlight, subsequent encounter: Secondary | ICD-10-CM | POA: Diagnosis not present

## 2022-08-30 DIAGNOSIS — D225 Melanocytic nevi of trunk: Secondary | ICD-10-CM | POA: Diagnosis not present

## 2022-09-05 ENCOUNTER — Other Ambulatory Visit: Payer: Self-pay | Admitting: Cardiology

## 2022-09-05 DIAGNOSIS — I10 Essential (primary) hypertension: Secondary | ICD-10-CM

## 2022-09-12 ENCOUNTER — Encounter (INDEPENDENT_AMBULATORY_CARE_PROVIDER_SITE_OTHER): Payer: Self-pay

## 2022-09-13 ENCOUNTER — Other Ambulatory Visit: Payer: Self-pay | Admitting: Nurse Practitioner

## 2022-09-17 ENCOUNTER — Encounter: Payer: Self-pay | Admitting: Nurse Practitioner

## 2022-09-17 ENCOUNTER — Ambulatory Visit (INDEPENDENT_AMBULATORY_CARE_PROVIDER_SITE_OTHER): Payer: Medicare PPO

## 2022-09-17 DIAGNOSIS — E291 Testicular hypofunction: Secondary | ICD-10-CM

## 2022-09-17 MED ORDER — TESTOSTERONE CYPIONATE 200 MG/ML IM SOLN
200.0000 mg | Freq: Once | INTRAMUSCULAR | Status: AC
Start: 2022-09-17 — End: 2022-09-17
  Administered 2022-09-17: 200 mg via INTRAMUSCULAR

## 2022-09-17 NOTE — Progress Notes (Signed)
Per orders of Mordecai Maes, NP , injection of testosterone 200 mg given by Lewanda Rife in RUOQ .Patient tolerated injection well. Patient will make appointment for 1 month. Pt requested.injection in RUOQ again this month since LUOQ is still tender from previous injection.

## 2022-09-18 MED ORDER — TESTOSTERONE CYPIONATE 200 MG/ML IM SOLN
200.0000 mg | INTRAMUSCULAR | 0 refills | Status: DC
Start: 2022-09-18 — End: 2022-12-12

## 2022-10-03 ENCOUNTER — Telehealth: Payer: Self-pay | Admitting: Nurse Practitioner

## 2022-10-03 NOTE — Telephone Encounter (Signed)
Prescription Request  10/03/2022  LOV: 04/12/2022  What is the name of the medication or equipment? Vitamin D 2  Have you contacted your pharmacy to request a refill? Yes   Which pharmacy would you like this sent to?  [id number] [place], [place] - [address] [place] [address] Phone: [phone] Fax: [phone]    Patient notified that their request is being sent to the clinical staff for review and that they should receive a response within 2 business days.   Please advise at Mobile [phone] (mobile)

## 2022-10-04 MED ORDER — VITAMIN D (ERGOCALCIFEROL) 1.25 MG (50000 UNIT) PO CAPS: 50000.0000 [IU] | ORAL_CAPSULE | ORAL | 0 refills | Status: DC

## 2022-10-04 NOTE — Addendum Note (Signed)
Addended by: Eden Emms on: 10/04/2022 01:31 PM   Modules accepted: Orders

## 2022-10-14 ENCOUNTER — Ambulatory Visit: Payer: Medicare PPO | Admitting: Nurse Practitioner

## 2022-10-14 ENCOUNTER — Other Ambulatory Visit: Payer: Self-pay

## 2022-10-14 ENCOUNTER — Ambulatory Visit (INDEPENDENT_AMBULATORY_CARE_PROVIDER_SITE_OTHER): Payer: Medicare PPO

## 2022-10-14 ENCOUNTER — Other Ambulatory Visit: Payer: Self-pay | Admitting: Cardiology

## 2022-10-14 VITALS — Wt 235.0 lb

## 2022-10-14 DIAGNOSIS — Z0000 Encounter for general adult medical examination without abnormal findings: Secondary | ICD-10-CM

## 2022-10-14 DIAGNOSIS — I10 Essential (primary) hypertension: Secondary | ICD-10-CM

## 2022-10-14 NOTE — Patient Instructions (Addendum)
[name] [name] , Thank you for taking time to come for your Medicare Wellness Visit. I appreciate your ongoing commitment to your health goals. Please review the following plan we discussed and let me know if I can assist you in the future.   Referrals/Orders/Follow-Ups/Clinician Recommendations: pt stated to continue to lose weight and will follow up with  missing immunizations   This is a list of the screening recommended for you and due dates:  Health Maintenance  Topic Date Due   Complete foot exam   Never done   Eye exam for diabetics  Never done   DTaP/Tdap/Td vaccine (1 - Tdap) Never done   Zoster (Shingles) Vaccine (1 of 2) Never done   Pneumonia Vaccine (2 of 2 - PCV) 01/28/2017   Hemoglobin A1C  04/12/2022   Flu Shot  08/29/2022   COVID-19 Vaccine (6 - 2023-24 season) 09/29/2022   Medicare Annual Wellness Visit  10/12/2022   HPV Vaccine  Aged Out    Advanced directives: (In Chart) A copy of your advanced directives are scanned into your chart should your provider ever need it.  Next Medicare Annual Wellness Visit scheduled for next year: Yes

## 2022-10-14 NOTE — Progress Notes (Cosign Needed Addendum)
Subjective:   [name] [name] is a 89 y.o. male who presents for Medicare Annual/Subsequent preventive examination. Completd along with wife   Visit Complete: Virtual  I connected with  [name] [name] on 10/14/22 by a audio enabled telemedicine application and verified that I am speaking with the correct person using two identifiers.  Vital Signs: Unable to obtain new vitals due to this being a telehealth visit.   Patient Location: Home  Provider Location: Office/Clinic  I discussed the limitations of evaluation and management by telemedicine. The patient expressed understanding and agreed to proceed.   Cardiac Risk Factors include: advanced age (>82men, >23 women)     Objective:    Today's Vitals   10/14/22 0827  Weight: 235 lb (106.6 kg)   Body mass index is 39.71 kg/m.     10/14/2022    8:35 AM 10/11/2021   11:44 AM 03/12/2021    1:00 PM 02/28/2021   11:36 AM 04/07/2013    3:58 PM 11/18/2012   11:41 AM 11/17/2012    1:41 PM  Advanced Directives  Does Patient Have a Medical Advance Directive? Yes Yes Yes Yes Patient has advance directive, copy not in chart Patient has advance directive, copy in chart Patient has advance directive, copy not in chart  Type of Advance Directive Healthcare Power of [place];Living will Healthcare Power of [place];Living will Healthcare Power of [place];Living will Healthcare Power of [place];Living will Living will  Healthcare Power of [place];Living will  Does patient want to make changes to medical advance directive? No - Patient declined No - Patient declined No - Patient declined  No change requested    Copy of Healthcare Power of Attorney in Chart? Yes - validated most recent copy scanned in chart (See row information) Yes - validated most recent copy scanned in chart (See row information) Yes - validated most recent copy scanned in chart (See row information) No - copy requested  -- Copy requested from family    Current Medications  (verified) Outpatient Encounter Medications as of 10/14/2022  Medication Sig   acetaminophen (TYLENOL) 650 MG CR tablet Take 650 mg by mouth every 8 (eight) hours as needed for pain.   allopurinol (ZYLOPRIM) 300 MG tablet TAKE 1 TABLET BY MOUTH EVERY DAY   aspirin EC 81 MG tablet Take 81 mg by mouth daily. Swallow whole.   cetirizine (ZYRTEC) 10 MG tablet Take 10 mg by mouth daily as needed for allergies.   cromolyn (OPTICROM) 4 % ophthalmic solution Place 1 drop into both eyes in the morning and at bedtime.   docusate sodium (COLACE) 100 MG capsule Take 100 mg by mouth daily as needed for moderate constipation.   hydrochlorothiazide (HYDRODIURIL) 25 MG tablet TAKE 1 TABLET BY MOUTH EVERY DAY   isosorbide mononitrate (IMDUR) 60 MG 24 hr tablet Take 1 tablet (60 mg total) by mouth daily.   labetalol (NORMODYNE) 200 MG tablet TAKE 1 TABLET BY MOUTH TWICE A DAY   levothyroxine (SYNTHROID) 100 MCG tablet Take 1 tablet (100 mcg total) by mouth daily before breakfast.   losartan (COZAAR) 100 MG tablet Take 1 tablet (100 mg total) by mouth daily.   methocarbamol (ROBAXIN) 500 MG tablet Take 1 tablet (500 mg total) by mouth every 6 (six) hours as needed for muscle spasms.   Multiple Vitamin (MULTIVITAMIN) tablet Take 1 tablet by mouth daily.   Multiple Vitamins-Minerals (PRESERVISION AREDS PO) Take 1 capsule by mouth in the morning and at bedtime.   Omega-3 Fatty Acids (  FISH OIL) 1000 MG CAPS Take 1 capsule by mouth daily at 12 noon.   Polyethyl Glycol-Propyl Glycol (SYSTANE OP) Place 1 drop into both eyes daily as needed (dry eyes).   rosuvastatin (CRESTOR) 20 MG tablet TAKE 1 TABLET BY MOUTH EVERYDAY AT BEDTIME   spironolactone (ALDACTONE) 25 MG tablet TAKE 1 TABLET (25 MG TOTAL) BY MOUTH IN THE MORNING   testosterone cypionate (DEPOTESTOSTERONE CYPIONATE) 200 MG/ML injection Inject 1 mL (200 mg total) into the muscle every 28 (twenty-eight) days.   Vitamin D, Ergocalciferol, (DRISDOL) 1.25 MG (50000  UNIT) CAPS capsule Take 1 capsule (50,000 Units total) by mouth every 7 (seven) days.   nitroGLYCERIN (NITROSTAT) 0.4 MG SL tablet Place 1 tablet (0.4 mg total) under the tongue every 5 (five) minutes as needed for chest pain. If your symptoms continue after taking two tablets 5 minutes apart call 911. (Patient not taking: Reported on 10/14/2022)   [DISCONTINUED] fluticasone (FLONASE) 50 MCG/ACT nasal spray Place 1 spray into both nostrils daily as needed for allergies. (Patient not taking: Reported on 04/12/2022)   [DISCONTINUED] sodium chloride (OCEAN) 0.65 % SOLN nasal spray Place 1 spray into both nostrils as needed for congestion.   Facility-Administered Encounter Medications as of 10/14/2022  Medication   testosterone cypionate (DEPOTESTOSTERONE CYPIONATE) injection 200 mg   testosterone cypionate (DEPOTESTOSTERONE CYPIONATE) injection 200 mg   testosterone cypionate (DEPOTESTOSTERONE CYPIONATE) injection 200 mg   testosterone cypionate (DEPOTESTOSTERONE CYPIONATE) injection 200 mg   testosterone cypionate (DEPOTESTOSTERONE CYPIONATE) injection 200 mg    Allergies (verified) Amlodipine, Ciprofloxacin, Hydralazine, Hydrocodone, Levofloxacin, Oxycodone, Tramadol, and Cardizem [diltiazem hcl]   History: Past Medical History:  Diagnosis Date   Complication of anesthesia    Pt difficult to intubate; 07/19/03 aborted surgery unable to intubate   Coronary artery disease    Diabetes mellitus without complication (HCC)    Dysthymic disorder    Gout    Hx: of in left wrist   Gout    Hearing deficit    Hx: of wears hearing aids   Hypertension    Kidney stones    Hx: of   Multiple myeloma (HCC)    Occlusion and stenosis of unspecified carotid artery    Pathologic fracture of vertebrae    Pneumonia    Rosacea    Sleep apnea    Hx: of pt wears CPAP   Past Surgical History:  Procedure Laterality Date   APPENDECTOMY     BACK SURGERY  [phone]   lumb   CARDIAC CATHETERIZATION     Hx:  of 04/05/10   CAROTID ARTERY - SUBCLAVIAN ARTERY BYPASS GRAFT  2004   Hx: of-left   CARPAL TUNNEL RELEASE Right 04/13/2013   Procedure: RIGHT CARPAL TUNNEL RELEASE;  Surgeon: Wyn Forster., MD;  Location: [facility];  Service: Orthopedics;  Laterality: Right;   CATARACT EXTRACTION     Hx: of left eye only   COLONOSCOPY     COLONOSCOPY W/ BIOPSIES AND POLYPECTOMY     Hx: of   CORONARY ARTERY BYPASS GRAFT  2012   Hx: of 5   ELBOW SURGERY  1992   Hx: of-lt   HERNIA REPAIR  1963   Hx: of right   JOINT REPLACEMENT  2006   Hx: of left knee   LUMBAR LAMINECTOMY/DECOMPRESSION MICRODISCECTOMY N/A 11/18/2012   Procedure: REVISION L2 - L4 DECOMPRESSION DISCECTOMY 2 LEVELS;  Surgeon: Venita Lick, MD;  Location: [facility];  Service: Orthopedics;  Laterality: N/A;   ROTATOR  CUFF REPAIR  2003   Hx: of right   TONSILLECTOMY     TOTAL KNEE ARTHROPLASTY Right 03/12/2021   Procedure: TOTAL KNEE ARTHROPLASTY;  Surgeon: Ollen Gross, MD;  Location: [facility];  Service: Orthopedics;  Laterality: Right;   TRIGGER FINGER RELEASE Right 04/13/2013   Procedure: RIGHT THUMB A-1 PULLEY RELEASE;  Surgeon: Wyn Forster., MD;  Location:  SURGERY CENTER;  Service: Orthopedics;  Laterality: Right;   Family History  Problem Relation Age of Onset   Cancer - Colon Mother    Heart disease Father    Alzheimer's disease Sister    Stroke Sister        6 or 7   Alzheimer's disease Sister    Alzheimer's disease Brother    Dementia Other    Social History   Socioeconomic History   Marital status: Married    Spouse name: Not on file   Number of children: 1   Years of education: college   Highest education level: Not on file  Occupational History   Occupation: Retired   Tobacco Use   Smoking status: Former    Current packs/day: 0.00    Average packs/day: 0.5 packs/day for 30.0 years (15.0 ttl pk-yrs)    Types: Cigarettes    Start date: 04/07/1948    Quit date: 04/08/1978    Years  since quitting: 44.5   Smokeless tobacco: Never   Tobacco comments:    Quit smoking cigarretes in 1980  Vaping Use   Vaping status: Never Used  Substance and Sexual Activity   Alcohol use: No    Alcohol/week: 0.0 standard drinks of alcohol   Drug use: No   Sexual activity: Not on file  Other Topics Concern   Not on file  Social History Narrative   Rare caffeine use    Social Determinants of Health   Financial Resource Strain: Low Risk  (10/14/2022)   Overall Financial Resource Strain (CARDIA)    Difficulty of Paying Living Expenses: Not hard at all  Food Insecurity: No Food Insecurity (10/14/2022)   Hunger Vital Sign    Worried About Running Out of Food in the Last Year: Never true    Ran Out of Food in the Last Year: Never true  Transportation Needs: No Transportation Needs (10/14/2022)   PRAPARE - Administrator, Civil Service (Medical): No    Lack of Transportation (Non-Medical): No  Physical Activity: Sufficiently Active (10/14/2022)   Exercise Vital Sign    Days of Exercise per Week: 5 days    Minutes of Exercise per Session: 60 min  Stress: No Stress Concern Present (10/14/2022)   [employer] of Occupational Health - Occupational Stress Questionnaire    Feeling of Stress : Not at all  Social Connections: Socially Integrated (10/14/2022)   Social Connection and Isolation Panel [NHANES]    Frequency of Communication with Friends and Family: More than three times a week    Frequency of Social Gatherings with Friends and Family: More than three times a week    Attends Religious Services: More than 4 times per year    Active Member of [employer] or Organizations: Yes    Attends Banker Meetings: 1 to 4 times per year    Marital Status: Married    Tobacco Counseling Counseling given: Not Answered Tobacco comments: Quit smoking cigarretes in 1980   Clinical Intake:  Pre-visit preparation completed: Yes  Pain : No/denies pain     BMI -  recorded: 39.71 Nutritional Status: BMI > 30  Obese Nutritional Risks: None Diabetes: Yes CBG done?: No Did pt. bring in CBG monitor from home?: No     Interpreter Needed?: No  Information entered by :: Lanier Ensign, LPN   Activities of Daily Living    10/14/2022    8:29 AM  In your present state of health, do you have any difficulty performing the following activities:  Hearing? 1  Comment hearing aids  Vision? 0  Difficulty concentrating or making decisions? 0  Walking or climbing stairs? 0  Dressing or bathing? 0  Doing errands, shopping? 0  Preparing Food and eating ? N  Using the Toilet? N  In the past six months, have you accidently leaked urine? N  Do you have problems with loss of bowel control? N  Managing your Medications? N  Managing your Finances? N  Housekeeping or managing your Housekeeping? N    Patient Care Team: Eden Emms, NP as PCP - General (Nurse Practitioner) Waymon Budge, MD as Consulting Physician (Pulmonary Disease) Nita Sells, MD (Dermatology) Yates Decamp, MD as Consulting Physician (Cardiology)  Indicate any recent Medical Services you may have received from other than [facility] providers in the past year (date may be approximate).     Assessment:   This is a routine wellness examination for [name] [name].  Hearing/Vision screen Hearing Screening - Comments:: Pt wears hearing aids  Vision Screening - Comments:: Pt follows up with Dr Dione Booze for annual eye exams    Goals Addressed             This Visit's Progress    Patient Stated       CONTINUE TO LOSE WEIGHT        Depression Screen    10/14/2022    8:35 AM 04/12/2022    8:52 AM 10/11/2021   11:41 AM 07/13/2021   12:45 PM 07/13/2021    9:51 AM  PHQ 2/9 Scores  PHQ - 2 Score 0 1 0 2 0  PHQ- 9 Score  6  7     Fall Risk    10/14/2022    8:38 AM 04/12/2022    8:52 AM 10/11/2021   11:43 AM 07/13/2021    9:49 AM 06/01/2015    1:26 PM  Fall Risk   Falls in the past year? 1 1 1 1   No  Number falls in past yr: 1 1 0 1   Injury with Fall? 0 0 0 1   Risk for fall due to : Impaired vision No Fall Risks No Fall Risks    Follow up Falls prevention discussed Falls evaluation completed Falls prevention discussed      MEDICARE RISK AT HOME: Medicare Risk at Home Any stairs in or around the home?: No If so, are there any without handrails?: No Home free of loose throw rugs in walkways, pet beds, electrical cords, etc?: Yes Adequate lighting in your home to reduce risk of falls?: Yes Life alert?: No Use of a cane, walker or w/c?: Yes Grab bars in the bathroom?: Yes Shower chair or bench in shower?: Yes Elevated toilet seat or a handicapped toilet?: Yes  TIMED UP AND GO:  Was the test performed?  No    Cognitive Function:    05/29/2016    2:42 PM 11/30/2015    1:40 PM 06/01/2015    1:40 PM  MMSE - Mini Mental State Exam  Orientation to time 4 4 4   Orientation to Place 4  4 3  Registration 3 3 2   Attention/ Calculation 2 3 5   Recall 2 3 3   Language- name 2 objects 2 2 2   Language- repeat 1 1 1   Language- follow 3 step command 3 3 3   Language- read & follow direction 1 1 1   Write a sentence 1 0 1  Copy design 1 1 1   Total score 24 25 26         10/14/2022    8:38 AM 10/11/2021   11:45 AM  6CIT Screen  What Year? 0 points 0 points  What month? 0 points 0 points  What time? 0 points 0 points  Count back from 20 0 points 0 points  Months in reverse 4 points 0 points  Repeat phrase 8 points 0 points  Total Score 12 points 0 points    Immunizations Immunization History  Administered Date(s) Administered   Fluad Quad(high Dose 65+) 10/19/2018   Influenza, High Dose Seasonal PF 10/06/2016   Influenza,inj,quad, With Preservative 10/28/2016, 09/28/2017, 09/29/2018   Influenza-Unspecified 10/02/2016   PFIZER(Purple Top)SARS-COV-2 Vaccination 02/12/2019, 03/05/2019, 11/22/2019, 11/03/2020   Pfizer Covid-19 Vaccine Bivalent Booster 66yrs & up 08/08/2020    Pneumococcal Polysaccharide-23 11/20/2012   Pneumococcal-Unspecified 01/29/2016    TDAP status: Due, Education has been provided regarding the importance of this vaccine. Advised may receive this vaccine at local pharmacy or Health Dept. Aware to provide a copy of the vaccination record if obtained from local pharmacy or Health Dept. Verbalized acceptance and understanding.  Flu Vaccine status: Due, Education has been provided regarding the importance of this vaccine. Advised may receive this vaccine at local pharmacy or Health Dept. Aware to provide a copy of the vaccination record if obtained from local pharmacy or Health Dept. Verbalized acceptance and understanding.  Pneumococcal vaccine status: Due, Education has been provided regarding the importance of this vaccine. Advised may receive this vaccine at local pharmacy or Health Dept. Aware to provide a copy of the vaccination record if obtained from local pharmacy or Health Dept. Verbalized acceptance and understanding.  Covid-19 vaccine status: Information provided on how to obtain vaccines.   Qualifies for Shingles Vaccine? Yes   Zostavax completed No   Shingrix Completed?: No.    Education has been provided regarding the importance of this vaccine. Patient has been advised to call insurance company to determine out of pocket expense if they have not yet received this vaccine. Advised may also receive vaccine at local pharmacy or Health Dept. Verbalized acceptance and understanding.  Screening Tests Health Maintenance  Topic Date Due   FOOT EXAM  Never done   OPHTHALMOLOGY EXAM  Never done   DTaP/Tdap/Td (1 - Tdap) Never done   Zoster Vaccines- Shingrix (1 of 2) Never done   Pneumonia Vaccine 25+ Years old (2 of 2 - PCV) 01/28/2017   HEMOGLOBIN A1C  04/12/2022   COVID-19 Vaccine (6 - 2023-24 season) 09/29/2022   INFLUENZA VACCINE  04/28/2023 (Originally 08/29/2022)   Medicare Annual Wellness (AWV)  10/14/2023   HPV VACCINES  Aged Out     Health Maintenance  Health Maintenance Due  Topic Date Due   FOOT EXAM  Never done   OPHTHALMOLOGY EXAM  Never done   DTaP/Tdap/Td (1 - Tdap) Never done   Zoster Vaccines- Shingrix (1 of 2) Never done   Pneumonia Vaccine 62+ Years old (2 of 2 - PCV) 01/28/2017   HEMOGLOBIN A1C  04/12/2022   COVID-19 Vaccine (6 - 2023-24 season) 09/29/2022    Colorectal cancer  screening: No longer required.    Additional Screening:   Vision Screening: Recommended annual ophthalmology exams for early detection of glaucoma and other disorders of the eye. Is the patient up to date with their annual eye exam?  Yes  Who is the provider or what is the name of the office in which the patient attends annual eye exams? Dr Dione Booze  If pt is not established with a provider, would they like to be referred to a provider to establish care? No .   Dental Screening: Recommended annual dental exams for proper oral hygiene  Diabetic Foot Exam: Diabetic Foot Exam: Overdue, Pt has been advised about the importance in completing this exam. Pt is scheduled for diabetic foot exam on next appt .  Community Resource Referral / Chronic Care Management: CRR required this visit?  No   CCM required this visit?  No     Plan:     I have personally reviewed and noted the following in the patient's chart:   Medical and social history Use of alcohol, tobacco or illicit drugs  Current medications and supplements including opioid prescriptions. Patient is not currently taking opioid prescriptions. Functional ability and status Nutritional status Physical activity Advanced directives List of other physicians Hospitalizations, surgeries, and ER visits in previous 12 months Vitals Screenings to include cognitive, depression, and falls Referrals and appointments  In addition, I have reviewed and discussed with patient certain preventive protocols, quality metrics, and best practice recommendations. A written  personalized care plan for preventive services as well as general preventive health recommendations were provided to patient.     Marzella Schlein, LPN   1/[id number]/[id number]   After Visit Summary: (MyChart) Due to this being a telephonic visit, the after visit summary with patients personalized plan was offered to patient via MyChart   Nurse Notes: none

## 2022-10-15 ENCOUNTER — Ambulatory Visit: Payer: Medicare PPO | Admitting: Nurse Practitioner

## 2022-10-15 ENCOUNTER — Encounter: Payer: Self-pay | Admitting: Nurse Practitioner

## 2022-10-15 VITALS — BP 130/60 | HR 58 | Temp 97.7°F | Ht 64.5 in | Wt 232.4 lb

## 2022-10-15 DIAGNOSIS — E119 Type 2 diabetes mellitus without complications: Secondary | ICD-10-CM

## 2022-10-15 DIAGNOSIS — E559 Vitamin D deficiency, unspecified: Secondary | ICD-10-CM | POA: Diagnosis not present

## 2022-10-15 DIAGNOSIS — E669 Obesity, unspecified: Secondary | ICD-10-CM | POA: Diagnosis not present

## 2022-10-15 DIAGNOSIS — Z6839 Body mass index (BMI) 39.0-39.9, adult: Secondary | ICD-10-CM | POA: Diagnosis not present

## 2022-10-15 DIAGNOSIS — E291 Testicular hypofunction: Secondary | ICD-10-CM | POA: Diagnosis not present

## 2022-10-15 LAB — CBC
HCT: 39.8 % (ref 39.0–52.0)
Hemoglobin: 13.3 g/dL (ref 13.0–17.0)
MCHC: 33.4 g/dL (ref 30.0–36.0)
MCV: 105.8 fl — ABNORMAL HIGH (ref 78.0–100.0)
Platelets: 132 10*3/uL — ABNORMAL LOW (ref 150.0–400.0)
RBC: 3.77 Mil/uL — ABNORMAL LOW (ref 4.22–5.81)
RDW: 16.2 % — ABNORMAL HIGH (ref 11.5–15.5)
WBC: 4.7 10*3/uL (ref 4.0–10.5)

## 2022-10-15 LAB — BASIC METABOLIC PANEL
BUN: 29 mg/dL — ABNORMAL HIGH (ref 6–23)
CO2: 28 meq/L (ref 19–32)
Calcium: 9.8 mg/dL (ref 8.4–10.5)
Chloride: 104 meq/L (ref 96–112)
Creatinine, Ser: 1.1 mg/dL (ref 0.40–1.50)
GFR: 60.21 mL/min (ref >60.00)
Glucose, Bld: 105 mg/dL — ABNORMAL HIGH (ref 70–99)
Potassium: 4.9 meq/L (ref 3.5–5.1)
Sodium: 137 meq/L (ref 135–145)

## 2022-10-15 LAB — POCT GLYCOSYLATED HEMOGLOBIN (HGB A1C): Hemoglobin A1C: 5.9 % — AB (ref 4.0–5.6)

## 2022-10-15 LAB — VITAMIN D 25 HYDROXY (VIT D DEFICIENCY, FRACTURES): VITD: 66.65 ng/mL (ref 30.00–100.00)

## 2022-10-15 NOTE — Assessment & Plan Note (Signed)
Patient currently maintained on lifestyle modifications only.  A1c dropped from 6.5% to 5.9%.  Patient has been working on weight loss with decreasing his portion size.  Continue healthy lifestyle modifications

## 2022-10-15 NOTE — Assessment & Plan Note (Signed)
Has been on prescription vitamin D for extended period of time we will check vitamin D level today

## 2022-10-15 NOTE — Patient Instructions (Signed)
Nice to see you today  I will be in touch with the labs once I have reviewed them Follow up with me in 6 months for your physical and labs

## 2022-10-15 NOTE — Assessment & Plan Note (Signed)
Testosterone injection 200 mg done today.

## 2022-10-15 NOTE — Progress Notes (Signed)
Established Patient Office Visit  Subjective   Patient ID: [name] [name], male    DOB: [birth year]  Age: 89 y.o. MRN: [id number]  Chief Complaint  Patient presents with   Diabetes    Pt complains of sciatic nerve pain. States that he had a shot for sciatic nerve pain. Lasted a week and complains the pain is back.    testosterone injection    Pt supplied for testosterone injection to be given.       DM2: patietn is currently maintained on lifestyl modificatoins only currently. States that he has lost a total of 40 pounds. He has been cutting his portion size back some. Instead of 2 scoops he will do 1-1.5 scoops of it  Lumbar back pain: patient is follwed by Dr Shon Baton ortho for back. States that he got an ijection that helped for approx 1 week then the pain came back. He was instruceted to swim but the patient does not want to. He will exercise walking form his house to his out building. States that it is intermittent in nature. States not bothering him today. Staets that sometimes he is able to get out and ride his tractor and it might not bother him. It is on the left side    HTN: patient is currently followe dby Dr Jacinto Halim an is on isosorbide, hydrochlorothiazide, labetalol, losartan, spironolactone. States that he checks it every other day. States that it will be 145-150 and then will be    Review of Systems  Constitutional:  Negative for chills and fever.  Respiratory:  Negative for shortness of breath.   Cardiovascular:  Negative for chest pain.  Neurological:  Negative for headaches.      Objective:     BP 130/60   Pulse (!) 58   Temp 97.7 F (36.5 C) (Temporal)   Ht 5' 4.5" (1.638 m)   Wt 232 lb 6.4 oz (105.4 kg)   SpO2 93%   BMI 39.28 kg/m  BP Readings from Last 3 Encounters:  10/15/22 130/60  04/12/22 138/62  10/22/21 (!) 133/54   Wt Readings from Last 3 Encounters:  10/15/22 232 lb 6.4 oz (105.4 kg)  10/14/22 235 lb (106.6 kg)  04/12/22 235 lb 2 oz (106.7  kg)      Physical Exam Vitals and nursing note reviewed.  Constitutional:      Appearance: Normal appearance.  Cardiovascular:     Rate and Rhythm: Normal rate and regular rhythm.     Heart sounds: Normal heart sounds.  Pulmonary:     Effort: Pulmonary effort is normal.     Breath sounds: Normal breath sounds.  Musculoskeletal:     Lumbar back: No tenderness or bony tenderness. Negative right straight leg raise test and negative left straight leg raise test.       Legs:  Neurological:     Mental Status: He is alert.      Results for orders placed or performed in visit on 10/15/22  POCT glycosylated hemoglobin (Hb A1C)  Result Value Ref Range   Hemoglobin A1C 5.9 (A) 4.0 - 5.6 %   HbA1c POC (<> result, manual entry)     HbA1c, POC (prediabetic range)     HbA1c, POC (controlled diabetic range)        The ASCVD Risk score (Arnett DK, et al., 2019) failed to calculate for the following reasons:   The 2019 ASCVD risk score is only valid for ages 47 to 48    Assessment &  Plan:   Problem List Items Addressed This Visit       Endocrine   Hypogonadism in male    Testosterone injection 200 mg done today.      Relevant Orders   CBC   Type 2 diabetes mellitus without complication, without long-term current use of insulin (HCC) - Primary    Patient currently maintained on lifestyle modifications only.  A1c dropped from 6.5% to 5.9%.  Patient has been working on weight loss with decreasing his portion size.  Continue healthy lifestyle modifications      Relevant Orders   POCT glycosylated hemoglobin (Hb A1C) (Completed)   Basic metabolic panel   CBC     Other   Obesity (BMI 30-39.9)    Patient is been working on weight loss.  Continue healthy lifestyle modifications.  Patient does exercise as he tolerates and weather permits      Vitamin D deficiency    Has been on prescription vitamin D for extended period of time we will check vitamin D level today       Relevant Orders   VITAMIN D 25 Hydroxy (Vit-D Deficiency, Fractures)    Return in about 6 months (around 04/14/2023) for CPE and Labs.    Audria Nine, NP

## 2022-10-15 NOTE — Assessment & Plan Note (Signed)
Patient is been working on weight loss.  Continue healthy lifestyle modifications.  Patient does exercise as he tolerates and weather permits

## 2022-10-16 ENCOUNTER — Other Ambulatory Visit: Payer: Self-pay | Admitting: Nurse Practitioner

## 2022-10-20 DIAGNOSIS — L03115 Cellulitis of right lower limb: Secondary | ICD-10-CM | POA: Diagnosis not present

## 2022-10-22 ENCOUNTER — Ambulatory Visit: Payer: Medicare PPO | Admitting: Cardiology

## 2022-10-31 NOTE — Progress Notes (Signed)
Cardiology Office Note:  .   Date:  11/01/2022  ID:  [name] [name], DOB [birth year], MRN [id number] PCP: Eden Emms, NP  [facility] Providers Cardiologist:  None    Patient Profile: .      PMH Coronary artery disease S/p CABG 2012 with LIMA to LAD, SVG to D1, SVG to OM1 & OM3, SVG to RPDA Hypertension Carotid artery disease S/p left endarterectomy (prior to 2013) Hyperlipidemia Multiple myeloma Currently in remission Sleep apnea on CPAP Gout Obesity   Longtime patient of [facility] cardiology.  He underwent CABG in 2012.  He reported chest tightness along with uncontrolled hypertension and dyspnea and underwent nuclear stress test March 2021 which revealed no ischemia, echocardiogram revealed moderate LVH and preserved LVEF.  Chest pain symptoms resolved with aggressive control of hypertension.  Last cardiology clinic visit was 10/22/2021 with Dr. Jacinto Halim at which time isosorbide dinitrate was stopped and isosorbide mononitrate 60 mg daily was started due to blurred vision. He continued to have swelling and pain s/p right knee replacement 03/12/2021. Carotid duplex was repeated for surveillance and revealed patent endarterectomy site left carotid, 1 to 15% stenosis right carotid.  No significant change from 09/07/2012. LDL cholesterol and BP were well-controlled.        History of Present Illness: .   [name] [name] is a very pleasant loquacious 89 y.o. male who is here today for follow-up and is accompanied by his wife. He is having significant pain secondary to sciatica. History of multiple back surgeries and knee surgery. Pain has been ongoing for six to seven months. The pain is described as severe when it occurs, but it is not constant. He estimates that he experiences this pain about 80-85% of the time. He also mentions an episode of chest discomfort and pain under his arm after bending over while working. This was a one-time occurrence and he does not recall which arm was  affected. He denies any other instances of chest pain. Home blood pressure typically around 138/40. He uses a CPAP machine for sleep and denies any issues with breathing or discomfort while in bed. The patient's spouse mentions that the patient had blood work done two weeks ago and that the patient's cholesterol was good in March.  He has mild bilateral LE edema felt to be secondary to prior knee surgeries.  No presyncope, syncope, palpitations, dyspnea, orthopnea, PND.  ROS: See HPI       Studies Reviewed: [name]   EKG Interpretation Date/Time:  Friday November 01 2022 14:22:47 EDT Ventricular Rate:  64 PR Interval:  202 QRS Duration:  102 QT Interval:  408 QTC Calculation: 420 R Axis:   52  Text Interpretation: Normal sinus rhythm Normal ECG No ST abnormality Confirmed by Eligha Bridegroom [phone]) on 11/01/2022 5:00:54 PM     Risk Assessment/Calculations:     HYPERTENSION CONTROL Vitals:   11/01/22 1402 11/01/22 1448  BP: (!) 160/88 (!) 144/80    The patient's blood pressure is elevated above target today.  In order to address the patient's elevated BP: Blood pressure will be monitored at home to determine if medication changes need to be made.          Physical Exam:   VS:  BP (!) 144/80   Pulse 64   Ht 5' 4.5" (1.638 m)   Wt 234 lb 9.6 oz (106.4 kg)   SpO2 90%   BMI 39.65 kg/m    Wt Readings from Last 3 Encounters:  11/01/22  234 lb 9.6 oz (106.4 kg)  10/15/22 232 lb 6.4 oz (105.4 kg)  10/14/22 235 lb (106.6 kg)    GEN: Obese, well developed in no acute distress NECK: No JVD; No carotid bruits CARDIAC: RRR, Systolic murmur. No rubs, gallops RESPIRATORY:  Clear to auscultation without rales, wheezing or rhonchi  ABDOMEN: Soft, non-tender, protuberant EXTREMITIES:  No edema; No deformity     ASSESSMENT AND PLAN: .    CAD with angina: S/p CABG 2012. One recent episode of chest pain while bending over. No current chest pain or shortness of breath. No indication for  further ischemia evaluation. Report any changes in symptoms. No bleeding concerns. Continue aspirin, Imdur, spironolactone, losartan, rosuvastatin, labetalol.  Carotid artery disease: Remote history of left endarterectomy.  Carotid duplex 10/2021 revealed 1 to 39% stenosis RICA, patent left carotid with no significant stenosis. No acute concerns today. We will continue to monitor clinically for now.  Valve disease: Mild MR, mild TR, and mild PR noted on echo 04/07/2019 with normal LVEF 55 to 60%. Slight systolic murmur noted on exam. Consideration given to repeating echocardiogram, however given that he is asymptomatic we will continue to monitor clinically for now.  Sciatica: Onset 6-7 months ago, intermittent but present 80-85% of the time. Pain likely contributing to elevated BP reading today. Management per orthopedist/pain management.   Hypertension: Home readings of 132-140 systolic. Today's reading initially high, but second reading 144/80. Recent BP with PCP was well controlled. Having pain from sciatica which may be contributing. Renal function stable on labs completed 10/15/2022.  Continue current management and home monitoring.  Dyslipidemia: LDL 40, HDL 33.60, TGs 101 on 04/12/22.  Continue rosuvastatin.       Dispo: 6 months with Dr. Jacinto Halim  Signed, Eligha Bridegroom, NP-C

## 2022-11-01 ENCOUNTER — Ambulatory Visit: Payer: Medicare PPO | Attending: Cardiology | Admitting: Nurse Practitioner

## 2022-11-01 ENCOUNTER — Encounter: Payer: Self-pay | Admitting: Nurse Practitioner

## 2022-11-01 VITALS — BP 144/80 | HR 64 | Ht 64.5 in | Wt 234.6 lb

## 2022-11-01 DIAGNOSIS — M5430 Sciatica, unspecified side: Secondary | ICD-10-CM

## 2022-11-01 DIAGNOSIS — E782 Mixed hyperlipidemia: Secondary | ICD-10-CM | POA: Diagnosis not present

## 2022-11-01 DIAGNOSIS — I10 Essential (primary) hypertension: Secondary | ICD-10-CM

## 2022-11-01 DIAGNOSIS — I6523 Occlusion and stenosis of bilateral carotid arteries: Secondary | ICD-10-CM

## 2022-11-01 DIAGNOSIS — I2510 Atherosclerotic heart disease of native coronary artery without angina pectoris: Secondary | ICD-10-CM

## 2022-11-01 DIAGNOSIS — I38 Endocarditis, valve unspecified: Secondary | ICD-10-CM

## 2022-11-01 NOTE — Patient Instructions (Signed)
Medication Instructions:   Your physician recommends that you continue on your current medications as directed. Please refer to the Current Medication list given to you today.   *If you need a refill on your cardiac medications before your next appointment, please call your pharmacy*   Lab Work:  None ordered.  If you have labs (blood work) drawn today and your tests are completely normal, you will receive your results only by: MyChart Message (if you have MyChart) OR A paper copy in the mail If you have any lab test that is abnormal or we need to change your treatment, we will call you to review the results.   Testing/Procedures:  None Ordered.   Follow-Up: At [facility], you and your health needs are our priority.  As part of our continuing mission to provide you with exceptional heart care, we have created designated Provider Care Teams.  These Care Teams include your primary Cardiologist (physician) and Advanced Practice Providers (APPs -  Physician Assistants and Nurse Practitioners) who all work together to provide you with the care you need, when you need it.  We recommend signing up for the patient portal called "MyChart".  Sign up information is provided on this After Visit Summary.  MyChart is used to connect with patients for Virtual Visits (Telemedicine).  Patients are able to view lab/test results, encounter notes, upcoming appointments, etc.  Non-urgent messages can be sent to your provider as well.   To learn more about what you can do with MyChart, go to [email or web address].    Your next appointment:   6 month(s)  Provider:   Yates Decamp, MD    Other Instructions  Your physician wants you to follow-up in: 6 months.  You will receive a reminder letter in the mail two months in advance. If you don't receive a letter, please call our office to schedule the follow-up appointment.

## 2022-11-12 ENCOUNTER — Ambulatory Visit: Payer: Medicare PPO

## 2022-11-13 ENCOUNTER — Encounter: Payer: Self-pay | Admitting: Nurse Practitioner

## 2022-11-13 ENCOUNTER — Other Ambulatory Visit: Payer: Self-pay | Admitting: Nurse Practitioner

## 2022-11-14 ENCOUNTER — Ambulatory Visit: Payer: Medicare PPO

## 2022-11-14 DIAGNOSIS — E291 Testicular hypofunction: Secondary | ICD-10-CM | POA: Diagnosis not present

## 2022-11-14 MED ORDER — METHOCARBAMOL 500 MG PO TABS: 500.0000 mg | ORAL_TABLET | Freq: Four times a day (QID) | ORAL | 0 refills | Status: DC | PRN

## 2022-11-14 MED ORDER — TESTOSTERONE CYPIONATE 200 MG/ML IM SOLN
200.0000 mg | INTRAMUSCULAR | Status: AC
  Administered 2022-11-14 – 2023-10-16 (×2): 200 mg via INTRAMUSCULAR

## 2022-11-14 NOTE — Progress Notes (Signed)
Per orders of Mordecai Maes, NP , injection of Testosterone  given by Wilburn Cornelia in right thigh. Patient tolerated injection well. Patient will make appointment for 28 days for next injection.

## 2022-11-26 DIAGNOSIS — D485 Neoplasm of uncertain behavior of skin: Secondary | ICD-10-CM | POA: Diagnosis not present

## 2022-11-26 DIAGNOSIS — D0361 Melanoma in situ of right upper limb, including shoulder: Secondary | ICD-10-CM | POA: Diagnosis not present

## 2022-11-26 DIAGNOSIS — X32XXXD Exposure to sunlight, subsequent encounter: Secondary | ICD-10-CM | POA: Diagnosis not present

## 2022-11-26 DIAGNOSIS — L570 Actinic keratosis: Secondary | ICD-10-CM | POA: Diagnosis not present

## 2022-11-26 DIAGNOSIS — D0360 Melanoma in situ of unspecified upper limb, including shoulder: Secondary | ICD-10-CM | POA: Diagnosis not present

## 2022-11-26 DIAGNOSIS — D225 Melanocytic nevi of trunk: Secondary | ICD-10-CM | POA: Diagnosis not present

## 2022-11-26 DIAGNOSIS — L929 Granulomatous disorder of the skin and subcutaneous tissue, unspecified: Secondary | ICD-10-CM | POA: Diagnosis not present

## 2022-11-26 DIAGNOSIS — L905 Scar conditions and fibrosis of skin: Secondary | ICD-10-CM | POA: Diagnosis not present

## 2022-12-11 ENCOUNTER — Other Ambulatory Visit: Payer: Self-pay | Admitting: Nurse Practitioner

## 2022-12-11 DIAGNOSIS — E291 Testicular hypofunction: Secondary | ICD-10-CM

## 2022-12-12 ENCOUNTER — Ambulatory Visit: Payer: Medicare PPO

## 2022-12-12 DIAGNOSIS — E291 Testicular hypofunction: Secondary | ICD-10-CM

## 2022-12-12 MED ORDER — TESTOSTERONE CYPIONATE 200 MG/ML IM SOLN
200.0000 mg | Freq: Once | INTRAMUSCULAR | Status: AC
Start: 2022-12-12 — End: 2022-12-12
  Administered 2022-12-12: 200 mg via INTRAMUSCULAR

## 2022-12-12 NOTE — Progress Notes (Signed)
Per orders of Mordecai Maes, NP , injection of testosterone 200 mg IM given by Lewanda Rife in right gluteus maximus pt said this time he wants to get in right hip area. Patient tolerated injection well. Patient will make appointment for 1 month.

## 2022-12-24 DIAGNOSIS — D485 Neoplasm of uncertain behavior of skin: Secondary | ICD-10-CM | POA: Diagnosis not present

## 2022-12-24 DIAGNOSIS — D0361 Melanoma in situ of right upper limb, including shoulder: Secondary | ICD-10-CM | POA: Diagnosis not present

## 2022-12-24 DIAGNOSIS — L988 Other specified disorders of the skin and subcutaneous tissue: Secondary | ICD-10-CM | POA: Diagnosis not present

## 2023-01-02 ENCOUNTER — Ambulatory Visit
Admission: RE | Admit: 2023-01-02 | Discharge: 2023-01-02 | Disposition: A | Discharge status: Home / Self Care | Payer: Medicare PPO | Source: Ambulatory Visit | Attending: Internal Medicine | Admitting: Internal Medicine

## 2023-01-02 ENCOUNTER — Ambulatory Visit: Payer: Medicare PPO | Admitting: Internal Medicine

## 2023-01-02 ENCOUNTER — Encounter: Payer: Self-pay | Admitting: Internal Medicine

## 2023-01-02 ENCOUNTER — Telehealth: Payer: Self-pay | Admitting: Nurse Practitioner

## 2023-01-02 VITALS — BP 138/76 | HR 64 | Temp 98.0°F | Ht 64.5 in | Wt 233.0 lb

## 2023-01-02 DIAGNOSIS — S2231XA Fracture of one rib, right side, initial encounter for closed fracture: Secondary | ICD-10-CM | POA: Diagnosis not present

## 2023-01-02 DIAGNOSIS — R059 Cough, unspecified: Secondary | ICD-10-CM | POA: Diagnosis not present

## 2023-01-02 DIAGNOSIS — R051 Acute cough: Secondary | ICD-10-CM | POA: Diagnosis not present

## 2023-01-02 DIAGNOSIS — S20219A Contusion of unspecified front wall of thorax, initial encounter: Secondary | ICD-10-CM | POA: Diagnosis not present

## 2023-01-02 NOTE — Telephone Encounter (Signed)
Okay---I will assess him at the [place]

## 2023-01-02 NOTE — Telephone Encounter (Signed)
FYI: This call has been transferred to Access Nurse. Once the result note has been entered staff can address the message at that time.  Patient called in with the following symptoms:  Red Word: abdominal pain, chest discomfort, fall    Please advise at [facility] [phone]  Message is routed to Provider Pool and [facility] Triage    Pt's wife, [name], called stating pt fell last week & since then fall, pt has been experiencing pain in his rib cage & abdominal area. [name] stated the pt has stomach pain whenever he coughs. Hazel also stated at night, when using his cpap machine, the pt has some chest discomfort, which causes him to not sleep. [name] mentioned she believes the pt may have a possible broken rib from falling. Scheduled pt with Dr. Alphonsus Sias for today, 12/5 @ 2:45pm. Transferred Hazel to access nurse.

## 2023-01-02 NOTE — Progress Notes (Signed)
Subjective:    Patient ID: [name] [name], male    DOB: January 01, 1933, 89 y.o.   MRN: [id number]  HPI Here due to rib pain after a fall  Larey Seat ---due to right leg "giving out"---1 week ago Hit against tire lawnmower tractor---hit under right ribs Wife did help him get up Continued with his work--just had some pain  Thought he was getting better But now the pain has moved up some No fever Some cough--moderate. Some sputum--phlegm but thick No SOB Mostly in due to wife's urging  Just uses tylenol  Current Outpatient Medications on File Prior to Visit  Medication Sig Dispense Refill   acetaminophen (TYLENOL) 650 MG CR tablet Take 650 mg by mouth every 8 (eight) hours as needed for pain.     allopurinol (ZYLOPRIM) 300 MG tablet TAKE 1 TABLET BY MOUTH EVERY DAY 90 tablet 1   aspirin EC 81 MG tablet Take 81 mg by mouth daily. Swallow whole.     cetirizine (ZYRTEC) 10 MG tablet Take 10 mg by mouth daily as needed for allergies.     cromolyn (OPTICROM) 4 % ophthalmic solution Place 1 drop into both eyes in the morning and at bedtime.  2   docusate sodium (COLACE) 100 MG capsule Take 100 mg by mouth daily as needed for moderate constipation.     doxycycline (VIBRAMYCIN) 100 MG capsule Take 100 mg by mouth 2 (two) times daily.     hydrochlorothiazide (HYDRODIURIL) 25 MG tablet TAKE 1 TABLET BY MOUTH EVERY DAY 90 tablet 1   isosorbide mononitrate (IMDUR) 60 MG 24 hr tablet TAKE 1 TABLET BY MOUTH EVERY DAY 90 tablet 3   labetalol (NORMODYNE) 200 MG tablet TAKE 1 TABLET BY MOUTH TWICE A DAY 180 tablet 1   levothyroxine (SYNTHROID) 100 MCG tablet Take 1 tablet (100 mcg total) by mouth daily before breakfast. 90 tablet 3   losartan (COZAAR) 100 MG tablet Take 1 tablet (100 mg total) by mouth daily. 90 tablet 1   methocarbamol (ROBAXIN) 500 MG tablet Take 1 tablet (500 mg total) by mouth every 6 (six) hours as needed for muscle spasms. 40 tablet 0   Multiple Vitamin (MULTIVITAMIN) tablet Take 1  tablet by mouth daily.     Multiple Vitamins-Minerals (PRESERVISION AREDS PO) Take 1 capsule by mouth in the morning and at bedtime.     nitroGLYCERIN (NITROSTAT) 0.4 MG SL tablet Place 1 tablet (0.4 mg total) under the tongue every 5 (five) minutes as needed for chest pain. If your symptoms continue after taking two tablets 5 minutes apart call 911. 90 tablet 3   Omega-3 Fatty Acids (FISH OIL) 1000 MG CAPS Take 1 capsule by mouth daily at 12 noon.     Polyethyl Glycol-Propyl Glycol (SYSTANE OP) Place 1 drop into both eyes daily as needed (dry eyes).     rosuvastatin (CRESTOR) 20 MG tablet TAKE 1 TABLET BY MOUTH EVERYDAY AT BEDTIME 90 tablet 1   spironolactone (ALDACTONE) 25 MG tablet TAKE 1 TABLET (25 MG TOTAL) BY MOUTH IN THE MORNING 90 tablet 2   testosterone cypionate (DEPOTESTOSTERONE CYPIONATE) 200 MG/ML injection INJECT 1 ML (200 MG TOTAL) INTO THE MUSCLE EVERY 28 (TWENTY-EIGHT) DAYS. 3 mL 0   Vitamin D, Ergocalciferol, (DRISDOL) 1.25 MG (50000 UNIT) CAPS capsule TAKE 1 CAPSULE (50,000 UNITS TOTAL) BY MOUTH EVERY 7 (SEVEN) DAYS 12 capsule 0   Current Facility-Administered Medications on File Prior to Visit  Medication Dose Route Frequency Provider Last Rate Last Admin  testosterone cypionate (DEPOTESTOSTERONE CYPIONATE) injection 200 mg  200 mg Intramuscular Q28 days Eden Emms, NP   200 mg at 01/25/22 1200   testosterone cypionate (DEPOTESTOSTERONE CYPIONATE) injection 200 mg  200 mg Intramuscular Q28 days Eden Emms, NP   200 mg at 02/26/22 1032   testosterone cypionate (DEPOTESTOSTERONE CYPIONATE) injection 200 mg  200 mg Intramuscular Q28 days Eden Emms, NP       testosterone cypionate (DEPOTESTOSTERONE CYPIONATE) injection 200 mg  200 mg Intramuscular Q28 days Eden Emms, NP   200 mg at 05/21/22 1038   testosterone cypionate (DEPOTESTOSTERONE CYPIONATE) injection 200 mg  200 mg Intramuscular Q28 days Joaquim Nam, MD   200 mg at 10/15/22 1108   testosterone  cypionate (DEPOTESTOSTERONE CYPIONATE) injection 200 mg  200 mg Intramuscular Q28 days Eden Emms, NP   200 mg at 11/14/22 1003    Allergies  Allergen Reactions   Amlodipine Swelling   Ciprofloxacin Nausea Only   Hydralazine Other (See Comments)    Leg edema   Hydrocodone Nausea Only    Ok taking in low doses(5mg ), has nausea with higher doses   Levofloxacin Other (See Comments)    Pain/cramping    Oxycodone Nausea Only   Tramadol Nausea Only   Cardizem [Diltiazem Hcl] Rash    Past Medical History:  Diagnosis Date   Complication of anesthesia    Pt difficult to intubate; 07/19/03 aborted surgery unable to intubate   Coronary artery disease    Diabetes mellitus without complication (HCC)    Dysthymic disorder    Gout    Hx: of in left wrist   Gout    Hearing deficit    Hx: of wears hearing aids   Hypertension    Kidney stones    Hx: of   Multiple myeloma (HCC)    Occlusion and stenosis of unspecified carotid artery    Pathologic fracture of vertebrae    Pneumonia    Rosacea    Sleep apnea    Hx: of pt wears CPAP    Past Surgical History:  Procedure Laterality Date   APPENDECTOMY     BACK SURGERY  [phone]   lumb   CARDIAC CATHETERIZATION     Hx: of 04/05/10   CAROTID ARTERY - SUBCLAVIAN ARTERY BYPASS GRAFT  2004   Hx: of-left   CARPAL TUNNEL RELEASE Right 04/13/2013   Procedure: RIGHT CARPAL TUNNEL RELEASE;  Surgeon: Wyn Forster., MD;  Location: [facility];  Service: Orthopedics;  Laterality: Right;   CATARACT EXTRACTION     Hx: of left eye only   COLONOSCOPY     COLONOSCOPY W/ BIOPSIES AND POLYPECTOMY     Hx: of   CORONARY ARTERY BYPASS GRAFT  2012   Hx: of 5   ELBOW SURGERY  1992   Hx: of-lt   HERNIA REPAIR  1963   Hx: of right   JOINT REPLACEMENT  2006   Hx: of left knee   LUMBAR LAMINECTOMY/DECOMPRESSION MICRODISCECTOMY N/A 11/18/2012   Procedure: REVISION L2 - L4 DECOMPRESSION DISCECTOMY 2 LEVELS;  Surgeon: Venita Lick,  MD;  Location: [facility];  Service: Orthopedics;  Laterality: N/A;   ROTATOR CUFF REPAIR  2003   Hx: of right   TONSILLECTOMY     TOTAL KNEE ARTHROPLASTY Right 03/12/2021   Procedure: TOTAL KNEE ARTHROPLASTY;  Surgeon: Ollen Gross, MD;  Location: [facility];  Service: Orthopedics;  Laterality: Right;   TRIGGER FINGER RELEASE Right 04/13/2013  Procedure: RIGHT THUMB A-1 PULLEY RELEASE;  Surgeon: Wyn Forster., MD;  Location: [facility];  Service: Orthopedics;  Laterality: Right;    Family History  Problem Relation Age of Onset   Cancer - Colon Mother    Heart disease Father    Alzheimer's disease Sister    Stroke Sister        6 or 7   Alzheimer's disease Sister    Alzheimer's disease Brother    Dementia Other     Social History   Socioeconomic History   Marital status: Married    Spouse name: Not on file   Number of children: 1   Years of education: college   Highest education level: Not on file  Occupational History   Occupation: Retired   Tobacco Use   Smoking status: Former    Current packs/day: 0.00    Average packs/day: 0.5 packs/day for 30.0 years (15.0 ttl pk-yrs)    Types: Cigarettes    Start date: 04/07/1948    Quit date: 04/08/1978    Years since quitting: 44.7   Smokeless tobacco: Never   Tobacco comments:    Quit smoking cigarretes in 1980  Vaping Use   Vaping status: Never Used  Substance and Sexual Activity   Alcohol use: No    Alcohol/week: 0.0 standard drinks of alcohol   Drug use: No   Sexual activity: Not on file  Other Topics Concern   Not on file  Social History Narrative   Rare caffeine use    Social Determinants of Health   Financial Resource Strain: Low Risk  (10/14/2022)   Overall Financial Resource Strain (CARDIA)    Difficulty of Paying Living Expenses: Not hard at all  Food Insecurity: No Food Insecurity (10/14/2022)   Hunger Vital Sign    Worried About Running Out of Food in the Last Year: Never true    Ran Out of  Food in the Last Year: Never true  Transportation Needs: No Transportation Needs (10/14/2022)   PRAPARE - Administrator, Civil Service (Medical): No    Lack of Transportation (Non-Medical): No  Physical Activity: Sufficiently Active (10/14/2022)   Exercise Vital Sign    Days of Exercise per Week: 5 days    Minutes of Exercise per Session: 60 min  Stress: No Stress Concern Present (10/14/2022)   [employer] of Occupational Health - Occupational Stress Questionnaire    Feeling of Stress : Not at all  Social Connections: Socially Integrated (10/14/2022)   Social Connection and Isolation Panel [NHANES]    Frequency of Communication with Friends and Family: More than three times a week    Frequency of Social Gatherings with Friends and Family: More than three times a week    Attends Religious Services: More than 4 times per year    Active Member of [employer] or Organizations: Yes    Attends Banker Meetings: 1 to 4 times per year    Marital Status: Married  Catering manager Violence: Not At Risk (10/14/2022)   Humiliation, Afraid, Rape, and Kick questionnaire    Fear of Current or Ex-Partner: No    Emotionally Abused: No    Physically Abused: No    Sexually Abused: No   Review of Systems Recurrent sciatic nerve problems Sleeps with CPAP Is on doxycycline--after skin cancer excision    Objective:   Physical Exam Constitutional:      Appearance: Normal appearance.  Pulmonary:     Effort: Pulmonary  effort is normal.     Breath sounds: No wheezing.     Comments: Fair air movement Slight early inspiratory crackles in RLL. Mild tenderness ~T8-9 in anterior axillary line on right Musculoskeletal:     Cervical back: Neck supple.  Lymphadenopathy:     Cervical: No cervical adenopathy.  Neurological:     Mental Status: He is alert.            Assessment & Plan:

## 2023-01-02 NOTE — Telephone Encounter (Signed)
Per appt notes pt already has appt with Dr Alphonsus Sias 01/02/23 at 2:45. Sending note to Dr Alphonsus Sias.

## 2023-01-02 NOTE — Assessment & Plan Note (Addendum)
Had rib bruise a week ago---and increasing cough since then Mild crackles Could be atelectasis or secondary pneumonia----but is on doxy which might at least partially treat Will check CXR--shows no pneumonia, just a big heart but no change since 2017  Reassured Discussed deep breathing to keep lungs open Recheck prn

## 2023-01-09 ENCOUNTER — Other Ambulatory Visit: Payer: Self-pay | Admitting: Nurse Practitioner

## 2023-01-09 DIAGNOSIS — D472 Monoclonal gammopathy: Secondary | ICD-10-CM

## 2023-01-14 ENCOUNTER — Encounter: Payer: Self-pay | Admitting: Internal Medicine

## 2023-01-14 NOTE — Progress Notes (Signed)
[name], MD, CAQ Sports Medicine [facility] [address] [place], [address]  Phone: [phone]  FAX: [phone]  [name] [name] - 89 y.o. male  MRN [id number]  Date of Birth: 02/03/1932  Date: 01/15/2023  PCP: Eden Emms, NP  Referral: Eden Emms, NP  Chief Complaint  Patient presents with   Leg Pain    Bilateral but right is worse   Subjective:   [name] [name] is a 89 y.o. very pleasant male patient with Body mass index is 38.95 kg/m. who presents with the following:  He is a pleasant patient who presents with some ongoing right > left-sided leg pain.  He has some ongoing bilateral back pain, referred pain to the buttocks, and left-sided radicular pain.  He does have a history of intermittent chronic back pain for decades, and he has had 5 prior spine surgeries.  Current symptoms have been flared up for about 6 months.  He has already seen Dr. Shon Baton the spine surgeon as well as Danne Harbor from [facility] orthopedics. Dr. Shon Baton did his most recent spine surgery.  They felt as if the primary issue was spinal stenosis, and they did not recommend any kind of additional interventions other than physical therapy.  Saw Venita Lick in May. Dr. Ethelene Hal on 07/04/2022 Then saw Dr. Shon Baton again.  Spinal stenosis.  Taking some Robaxin. Larey Seat the other day  He also complains of some irritation of the testicles/sac.  Review of Systems is noted in the HPI, as appropriate  Objective:   BP 110/60 (BP Location: Right Arm, Patient Position: Sitting, Cuff Size: Large)   Pulse 62   Temp 98.5 F (36.9 C) (Temporal)   Ht 5' 4.5" (1.638 m)   Wt 230 lb 8 oz (104.6 kg)   SpO2 97%   BMI 38.95 kg/m   GEN: No acute distress; alert,appropriate. PULM: Breathing comfortably in no respiratory distress PSYCH: Normally interactive.    Range of motion at  the waist: Flexion, extension, lateral bending and rotation: Forward  flexion is limited to 65 degrees he does have pain with extension and to a lesser extent lateral bending and rotation  No echymosis or edema Rises to examination table with mild difficulty Gait: minimally antalgic  Inspection/Deformity: N Paraspinus Tenderness: L3-S1 bilaterally in the posterior buttocks  B Ankle Dorsiflexion (L5,4): 5/5 B Great Toe Dorsiflexion (L5,4): 5/5 Heel Walk (L5): WNL Toe Walk (S1): WNL Rise/Squat (L4): WNL, mild pain  SENSORY B Medial Foot (L4): WNL B Dorsum (L5): WNL B Lateral (S1): WNL Light Touch: WNL  B SLR, seated: neg B SLR, supine: neg B FABER: neg B Reverse FABER: neg B Greater Troch: NT B Log Roll: neg B Sciatic Notch: NT   Laboratory and Imaging Data:  Assessment and Plan:     ICD-10-CM   1. Spinal stenosis of lumbar region with neurogenic claudication  M48.062     2. Hypogonadism in male  E29.1 testosterone cypionate (DEPOTESTOSTERONE CYPIONATE) injection 200 mg     In the setting of known significant spinal stenosis and an 89 year old, I basically agree with Dr. Shon Baton and Dr. Horald Chestnut that conservative management is the most appropriate thing in this case.  He declines physical therapy at this juncture.  I am going to give him some steroids to take.  He will also get his routine testosterone injection today, 2.  Medication Management during today's office visit: Meds ordered this encounter  Medications  predniSONE (DELTASONE) 20 MG tablet    Sig: 2 tabs po daily for 5 days, then 1 tab po daily for 5 days    Dispense:  15 tablet    Refill:  0   testosterone cypionate (DEPOTESTOSTERONE CYPIONATE) injection 200 mg   Medications Discontinued During This Encounter  Medication Reason   doxycycline (VIBRAMYCIN) 100 MG capsule Completed Course    Orders placed today for conditions managed today: No orders of the defined types were placed in this encounter.   Disposition: No follow-ups on file.  [employer] One  speech-to-text software was used for transcription in this dictation.  Possible transcriptional errors can occur using Animal nutritionist.   Signed,  [name] [name]. Travonta Gill, MD   Outpatient Encounter Medications as of 01/15/2023  Medication Sig   acetaminophen (TYLENOL) 650 MG CR tablet Take 650 mg by mouth every 8 (eight) hours as needed for pain.   allopurinol (ZYLOPRIM) 300 MG tablet TAKE 1 TABLET BY MOUTH EVERY DAY   aspirin EC 81 MG tablet Take 81 mg by mouth daily. Swallow whole.   cetirizine (ZYRTEC) 10 MG tablet Take 10 mg by mouth daily as needed for allergies.   cromolyn (OPTICROM) 4 % ophthalmic solution Place 1 drop into both eyes in the morning and at bedtime.   docusate sodium (COLACE) 100 MG capsule Take 100 mg by mouth daily as needed for moderate constipation.   hydrochlorothiazide (HYDRODIURIL) 25 MG tablet TAKE 1 TABLET BY MOUTH EVERY DAY   isosorbide mononitrate (IMDUR) 60 MG 24 hr tablet TAKE 1 TABLET BY MOUTH EVERY DAY   labetalol (NORMODYNE) 200 MG tablet TAKE 1 TABLET BY MOUTH TWICE A DAY   levothyroxine (SYNTHROID) 100 MCG tablet Take 1 tablet (100 mcg total) by mouth daily before breakfast.   losartan (COZAAR) 100 MG tablet Take 1 tablet (100 mg total) by mouth daily.   methocarbamol (ROBAXIN) 500 MG tablet Take 1 tablet (500 mg total) by mouth every 6 (six) hours as needed for muscle spasms.   Multiple Vitamin (MULTIVITAMIN) tablet Take 1 tablet by mouth daily.   Multiple Vitamins-Minerals (PRESERVISION AREDS PO) Take 1 capsule by mouth in the morning and at bedtime.   nitroGLYCERIN (NITROSTAT) 0.4 MG SL tablet Place 1 tablet (0.4 mg total) under the tongue every 5 (five) minutes as needed for chest pain. If your symptoms continue after taking two tablets 5 minutes apart call 911.   Omega-3 Fatty Acids (FISH OIL) 1000 MG CAPS Take 1 capsule by mouth daily at 12 noon.   Polyethyl Glycol-Propyl Glycol (SYSTANE OP) Place 1 drop into both eyes daily as needed (dry eyes).    predniSONE (DELTASONE) 20 MG tablet 2 tabs po daily for 5 days, then 1 tab po daily for 5 days   rosuvastatin (CRESTOR) 20 MG tablet TAKE 1 TABLET BY MOUTH EVERYDAY AT BEDTIME   spironolactone (ALDACTONE) 25 MG tablet TAKE 1 TABLET (25 MG TOTAL) BY MOUTH IN THE MORNING   testosterone cypionate (DEPOTESTOSTERONE CYPIONATE) 200 MG/ML injection INJECT 1 ML (200 MG TOTAL) INTO THE MUSCLE EVERY 28 (TWENTY-EIGHT) DAYS.   Vitamin D, Ergocalciferol, (DRISDOL) 1.25 MG (50000 UNIT) CAPS capsule TAKE 1 CAPSULE (50,000 UNITS TOTAL) BY MOUTH EVERY 7 (SEVEN) DAYS   [DISCONTINUED] doxycycline (VIBRAMYCIN) 100 MG capsule Take 100 mg by mouth 2 (two) times daily.   Facility-Administered Encounter Medications as of 01/15/2023  Medication   testosterone cypionate (DEPOTESTOSTERONE CYPIONATE) injection 200 mg   testosterone cypionate (DEPOTESTOSTERONE CYPIONATE) injection 200 mg  testosterone cypionate (DEPOTESTOSTERONE CYPIONATE) injection 200 mg   testosterone cypionate (DEPOTESTOSTERONE CYPIONATE) injection 200 mg   testosterone cypionate (DEPOTESTOSTERONE CYPIONATE) injection 200 mg   testosterone cypionate (DEPOTESTOSTERONE CYPIONATE) injection 200 mg   testosterone cypionate (DEPOTESTOSTERONE CYPIONATE) injection 200 mg

## 2023-01-15 ENCOUNTER — Encounter: Payer: Self-pay | Admitting: Family Medicine

## 2023-01-15 ENCOUNTER — Encounter: Payer: Self-pay | Admitting: Nurse Practitioner

## 2023-01-15 ENCOUNTER — Ambulatory Visit: Payer: Medicare PPO | Admitting: Family Medicine

## 2023-01-15 ENCOUNTER — Ambulatory Visit: Payer: Medicare PPO

## 2023-01-15 VITALS — BP 110/60 | HR 62 | Temp 98.5°F | Ht 64.5 in | Wt 230.5 lb

## 2023-01-15 DIAGNOSIS — M48062 Spinal stenosis, lumbar region with neurogenic claudication: Secondary | ICD-10-CM

## 2023-01-15 DIAGNOSIS — E291 Testicular hypofunction: Secondary | ICD-10-CM | POA: Diagnosis not present

## 2023-01-15 MED ORDER — TESTOSTERONE CYPIONATE 200 MG/ML IM SOLN
200.0000 mg | INTRAMUSCULAR | Status: AC
  Administered 2023-01-15: 200 mg via INTRAMUSCULAR

## 2023-01-15 MED ORDER — PREDNISONE 20 MG PO TABS: ORAL_TABLET | ORAL | 0 refills | Status: DC

## 2023-02-01 ENCOUNTER — Other Ambulatory Visit: Payer: Self-pay | Admitting: Nurse Practitioner

## 2023-02-01 DIAGNOSIS — I2510 Atherosclerotic heart disease of native coronary artery without angina pectoris: Secondary | ICD-10-CM

## 2023-02-05 ENCOUNTER — Other Ambulatory Visit: Payer: Self-pay | Admitting: Cardiology

## 2023-02-05 DIAGNOSIS — I10 Essential (primary) hypertension: Secondary | ICD-10-CM

## 2023-02-12 ENCOUNTER — Ambulatory Visit: Payer: Medicare PPO

## 2023-02-12 ENCOUNTER — Other Ambulatory Visit: Payer: Self-pay

## 2023-02-12 DIAGNOSIS — E291 Testicular hypofunction: Secondary | ICD-10-CM

## 2023-02-12 MED ORDER — TESTOSTERONE CYPIONATE 200 MG/ML IM SOLN: 200.0000 mg | INTRAMUSCULAR | 0 refills | Status: DC

## 2023-02-12 MED ORDER — TESTOSTERONE CYPIONATE 200 MG/ML IM SOLN
200.0000 mg | Freq: Once | INTRAMUSCULAR | Status: AC
  Administered 2023-02-12: 200 mg via INTRAMUSCULAR

## 2023-02-12 NOTE — Progress Notes (Signed)
Per orders of Mordecai Maes, NP , injection of testosterone 200 mg given by Lewanda Rife in right anterior thigh. Patient tolerated injection well. Patient will make appointment for 1 month.

## 2023-02-12 NOTE — Telephone Encounter (Signed)
 Prescription Request  02/12/2023  LOV: Visit date not found; pt got trestosterone 200 mg/ml on 02/12/23 and needs refill before Feb injection.  [facility] at [facility] on 12/17/23. Pt last saw Margarie Shay on 10/15/22.   What is the name of the medication or equipment? Testosterone  200 mg/ml  Have you contacted your pharmacy to request a refill? No   Which pharmacy would you like this sent to?  [id number], [place] - [address] [place] [address] Phone: [phone] Fax: [phone]    Patient notified that their request is being sent to the clinical staff for review and that they should receive a response within 2 business days.   Please advise at [facility] [phone]

## 2023-02-13 NOTE — Telephone Encounter (Signed)
Pt s wife (DPR signed) notified that refill for testosterone was approved and [facility] [address]. Pts wife voiced understanding and will let pt know. Nothing further needed at this time.

## 2023-03-08 ENCOUNTER — Other Ambulatory Visit: Payer: Self-pay | Admitting: Cardiology

## 2023-03-08 DIAGNOSIS — I10 Essential (primary) hypertension: Secondary | ICD-10-CM

## 2023-03-18 ENCOUNTER — Ambulatory Visit (INDEPENDENT_AMBULATORY_CARE_PROVIDER_SITE_OTHER): Payer: Medicare PPO

## 2023-03-18 ENCOUNTER — Telehealth: Payer: Self-pay

## 2023-03-18 DIAGNOSIS — E291 Testicular hypofunction: Secondary | ICD-10-CM | POA: Diagnosis not present

## 2023-03-18 MED ORDER — TESTOSTERONE CYPIONATE 200 MG/ML IM SOLN
200.0000 mg | Freq: Once | INTRAMUSCULAR | Status: AC
  Administered 2023-03-18: 200 mg via INTRAMUSCULAR

## 2023-03-18 NOTE — Progress Notes (Signed)
 Per orders of Mordecai Maes, NP , injection of testosterone 200 mg IM given by Lewanda Rife in [place].(Pt request) Patient tolerated injection well. Patient will make appointment for 1 month.

## 2023-03-18 NOTE — Telephone Encounter (Signed)
 Pt was in today for [place] for testosterone inj and pt requested refill'; I spoke with Casimiro Needle at [facility] and pt has 3 refills on testosterone already available. Mrs Bhat  notified and she voiced understanding and will let pt know. Nothing further needed.

## 2023-03-19 ENCOUNTER — Encounter: Payer: Self-pay | Admitting: Nurse Practitioner

## 2023-03-25 DIAGNOSIS — L570 Actinic keratosis: Secondary | ICD-10-CM | POA: Diagnosis not present

## 2023-03-25 DIAGNOSIS — Z85828 Personal history of other malignant neoplasm of skin: Secondary | ICD-10-CM | POA: Diagnosis not present

## 2023-03-25 DIAGNOSIS — Z1283 Encounter for screening for malignant neoplasm of skin: Secondary | ICD-10-CM | POA: Diagnosis not present

## 2023-03-25 DIAGNOSIS — Z08 Encounter for follow-up examination after completed treatment for malignant neoplasm: Secondary | ICD-10-CM | POA: Diagnosis not present

## 2023-03-25 DIAGNOSIS — X32XXXD Exposure to sunlight, subsequent encounter: Secondary | ICD-10-CM | POA: Diagnosis not present

## 2023-03-25 DIAGNOSIS — D225 Melanocytic nevi of trunk: Secondary | ICD-10-CM | POA: Diagnosis not present

## 2023-04-08 ENCOUNTER — Other Ambulatory Visit: Payer: Medicare PPO

## 2023-04-08 ENCOUNTER — Other Ambulatory Visit: Payer: Self-pay | Admitting: Nurse Practitioner

## 2023-04-08 DIAGNOSIS — E291 Testicular hypofunction: Secondary | ICD-10-CM

## 2023-04-08 NOTE — Telephone Encounter (Signed)
 Copied from CRM [id number]. Topic: Clinical - Medication Refill >> Apr 08, 2023 12:29 PM [name] wrote: Most Recent Primary Care Visit:  Provider: Patience Musca  Department: [facility]  Visit Type: NURSE VISIT  Date: 03/18/2023  Medication: testosterone cypionate (DEPOTESTOSTERONE CYPIONATE) 200 MG/ML injection   Has the patient contacted their pharmacy? Yes (Agent: If no, request that the patient contact the pharmacy for the refill. If patient does not wish to contact the pharmacy document the reason why and proceed with request.) (Agent: If yes, when and what did the pharmacy advise?)  Is this the correct pharmacy for this prescription? Yes If no, delete pharmacy and type the correct one.  This is the patient's preferred pharmacy:  [phone] Otto, [place] - [address] [place] [address] Phone: [phone] Fax: [phone]   Has the prescription been filled recently? Yes  Is the patient out of the medication? Yes  Has the patient been seen for an appointment in the last year OR does the patient have an upcoming appointment? Yes  Can we respond through MyChart? Yes  Agent: Please be advised that Rx refills may take up to 3 business days. We ask that you follow-up with your pharmacy.

## 2023-04-09 ENCOUNTER — Other Ambulatory Visit: Payer: Self-pay | Admitting: Nurse Practitioner

## 2023-04-10 MED ORDER — TESTOSTERONE CYPIONATE 200 MG/ML IM SOLN: 200.0000 mg | INTRAMUSCULAR | 0 refills | Status: DC

## 2023-04-11 ENCOUNTER — Other Ambulatory Visit: Payer: Self-pay | Admitting: Nurse Practitioner

## 2023-04-11 ENCOUNTER — Other Ambulatory Visit: Payer: Self-pay | Admitting: Cardiology

## 2023-04-11 DIAGNOSIS — E291 Testicular hypofunction: Secondary | ICD-10-CM

## 2023-04-13 ENCOUNTER — Other Ambulatory Visit: Payer: Self-pay | Admitting: Nurse Practitioner

## 2023-04-13 DIAGNOSIS — E291 Testicular hypofunction: Secondary | ICD-10-CM

## 2023-04-14 ENCOUNTER — Telehealth: Payer: Self-pay

## 2023-04-14 ENCOUNTER — Other Ambulatory Visit: Payer: Self-pay

## 2023-04-14 MED ORDER — LOSARTAN POTASSIUM 100 MG PO TABS: 100.0000 mg | ORAL_TABLET | Freq: Every day | ORAL | 2 refills | Status: DC

## 2023-04-14 NOTE — Telephone Encounter (Signed)
 Pharmacy Patient Advocate Encounter   Received notification from Onbase that prior authorization for Testosterone Cypionate 200MG /ML intramuscular solution  is required/requested.   Insurance verification completed.   The patient is insured through [employer] .   Per test claim: PA required; PA submitted to above mentioned insurance via CoverMyMeds Key/confirmation #/EOC [facility] Status is pending

## 2023-04-15 ENCOUNTER — Ambulatory Visit (INDEPENDENT_AMBULATORY_CARE_PROVIDER_SITE_OTHER): Payer: Medicare PPO | Admitting: Nurse Practitioner

## 2023-04-15 ENCOUNTER — Encounter: Payer: Self-pay | Admitting: Nurse Practitioner

## 2023-04-15 VITALS — BP 124/80 | HR 60 | Temp 97.7°F | Ht 64.5 in | Wt 229.8 lb

## 2023-04-15 DIAGNOSIS — E039 Hypothyroidism, unspecified: Secondary | ICD-10-CM | POA: Diagnosis not present

## 2023-04-15 DIAGNOSIS — Z7984 Long term (current) use of oral hypoglycemic drugs: Secondary | ICD-10-CM

## 2023-04-15 DIAGNOSIS — E119 Type 2 diabetes mellitus without complications: Secondary | ICD-10-CM

## 2023-04-15 DIAGNOSIS — E291 Testicular hypofunction: Secondary | ICD-10-CM

## 2023-04-15 DIAGNOSIS — E559 Vitamin D deficiency, unspecified: Secondary | ICD-10-CM

## 2023-04-15 DIAGNOSIS — Z0000 Encounter for general adult medical examination without abnormal findings: Secondary | ICD-10-CM | POA: Diagnosis not present

## 2023-04-15 DIAGNOSIS — E669 Obesity, unspecified: Secondary | ICD-10-CM

## 2023-04-15 DIAGNOSIS — G4733 Obstructive sleep apnea (adult) (pediatric): Secondary | ICD-10-CM

## 2023-04-15 DIAGNOSIS — Z8739 Personal history of other diseases of the musculoskeletal system and connective tissue: Secondary | ICD-10-CM

## 2023-04-15 DIAGNOSIS — D472 Monoclonal gammopathy: Secondary | ICD-10-CM | POA: Diagnosis not present

## 2023-04-15 LAB — CBC
HCT: 39.7 % (ref 39.0–52.0)
Hemoglobin: 13.3 g/dL (ref 13.0–17.0)
MCHC: 33.5 g/dL (ref 30.0–36.0)
MCV: 107.4 fl — ABNORMAL HIGH (ref 78.0–100.0)
Platelets: 118 10*3/uL — ABNORMAL LOW (ref 150.0–400.0)
RBC: 3.7 Mil/uL — ABNORMAL LOW (ref 4.22–5.81)
RDW: 16.3 % — ABNORMAL HIGH (ref 11.5–15.5)
WBC: 4.7 10*3/uL (ref 4.0–10.5)

## 2023-04-15 LAB — COMPREHENSIVE METABOLIC PANEL
ALT: 28 U/L (ref 0–53)
AST: 32 U/L (ref 0–37)
Albumin: 4.3 g/dL (ref 3.5–5.2)
Alkaline Phosphatase: 68 U/L (ref 39–117)
BUN: 34 mg/dL — ABNORMAL HIGH (ref 6–23)
CO2: 24 meq/L (ref 19–32)
Calcium: 9.6 mg/dL (ref 8.4–10.5)
Chloride: 106 meq/L (ref 96–112)
Creatinine, Ser: 1.08 mg/dL (ref 0.40–1.50)
GFR: 61.33 mL/min (ref >60.00)
Glucose, Bld: 117 mg/dL — ABNORMAL HIGH (ref 70–99)
Potassium: 5.1 meq/L (ref 3.5–5.1)
Sodium: 138 meq/L (ref 135–145)
Total Bilirubin: 0.6 mg/dL (ref 0.2–1.2)
Total Protein: 7.1 g/dL (ref 6.0–8.3)

## 2023-04-15 LAB — MICROALBUMIN / CREATININE URINE RATIO
Creatinine,U: 85.6 mg/dL
Microalb Creat Ratio: 153.8 mg/g — ABNORMAL HIGH (ref 0.0–30.0)
Microalb, Ur: 13.2 mg/dL — ABNORMAL HIGH (ref 0.0–1.9)

## 2023-04-15 LAB — URIC ACID: Uric Acid, Serum: 4.2 mg/dL (ref 4.0–7.8)

## 2023-04-15 LAB — VITAMIN D 25 HYDROXY (VIT D DEFICIENCY, FRACTURES): VITD: 47.44 ng/mL (ref 30.00–100.00)

## 2023-04-15 LAB — TSH: TSH: 1.23 u[IU]/mL (ref 0.35–5.50)

## 2023-04-15 LAB — POCT GLYCOSYLATED HEMOGLOBIN (HGB A1C): Hemoglobin A1C: 5.9 % — AB (ref 4.0–5.6)

## 2023-04-15 LAB — LIPID PANEL
Cholesterol: 99 mg/dL (ref 0–200)
HDL: 33 mg/dL — ABNORMAL LOW (ref >39.00)
LDL Cholesterol: 44 mg/dL (ref 0–99)
NonHDL: 66.4
Total CHOL/HDL Ratio: 3
Triglycerides: 112 mg/dL (ref 0.0–149.0)
VLDL: 22.4 mg/dL (ref 0.0–40.0)

## 2023-04-15 NOTE — Assessment & Plan Note (Signed)
Continue using CPAP therapy.

## 2023-04-15 NOTE — Assessment & Plan Note (Signed)
 Continue working gradual weight loss.  Pending TSH and lipid panel

## 2023-04-15 NOTE — Assessment & Plan Note (Signed)
 Maintained on 100 mg testosterone once monthly.  Continue

## 2023-04-15 NOTE — Assessment & Plan Note (Signed)
 Patient's A1c 5.9%.  Maintain lifestyle modifications only continue.  Patient has been consciously wanting to lose weight is down approximately 50 pounds

## 2023-04-15 NOTE — Patient Instructions (Signed)
 Nice to see you today I will be in touch with the lab since I have the results Follow up with me in 6 months

## 2023-04-15 NOTE — Assessment & Plan Note (Signed)
 History of the same with repletion.  Pending vitamin D level today

## 2023-04-15 NOTE — Assessment & Plan Note (Signed)
 History Maintained on levothyroxine 100 mcg daily.  Pending TSH

## 2023-04-15 NOTE — Assessment & Plan Note (Signed)
 Discussed age-appropriate immunizations and screening exams.  Did review patient's personal, surgical, social, family histories.  Patient up-to-date on all age-appropriate vaccinations he would like.  Patient has shingles vaccine schedule at pharmacy.  Patient is aged out for [facility] screening.  Does not want participate in prostate cancer screening.  Patient was given information at discharge about preventative healthcare maintenance with anticipatory guidance.

## 2023-04-15 NOTE — Assessment & Plan Note (Signed)
 Patient currently maintained on allopurinol 300 mg daily.  Continue pending uric acid and CMP

## 2023-04-15 NOTE — Progress Notes (Signed)
 Established Patient Office Visit  Subjective   Patient ID: [name] [name], male    DOB: June 30, 1932  Age: 89 y.o. MRN: [id number]  Chief Complaint  Patient presents with   Annual Exam    Pt has appt for shingles vaccines at [facility] scheduled.    Medication Management    Testosterone injection. Pts insurance won't accept medication without ppw.     HPI  Gout: Patient currently maintained on allopurinol 300 mg daily.  HTN: Patient currently maintained on hydrochlorothiazide 25 mg daily, isosorbide mononitrate 60 mg daily, labetalol 200 mg twice daily, losartan 100 mg daily, spironolactone 25 mg daily.  Patient is followed by cardiology. He does check BP at home   Hypothyroidism: Patient is currently maintained on levothyroxine 100 mcg daily.  Hypogonadism: Patient currently maintained on testosterone 100mg  mg every 28 days  DM2: Patient is currently maintained on lifestyle modifications. for complete physical and follow up of chronic conditions. Does not check his sugar at home   Immunizations: -Tetanus: Unsure but thinks it is up to date -Influenza:  up to date  -Shingles: Get at [facility]. Is scheduled  -Pneumonia:? Thinks it is up to date  Diet: Fair diet. He is eating  1 meal and some snacks. He will drink coffee and water  Exercise: No regular exercise.  Eye exam: Completes annually. Wears glasses  Dental exam: Completes semi-annually    Colonoscopy: Completed in currently aged out.  Last colonoscopy show was 2014 with no polyps Lung Cancer Screening: N/A  PSA:? Patient defers  Advanced directive: Patient has aged POA and living will.       Review of Systems  Constitutional:  Negative for chills and fever.  Respiratory:  Negative for shortness of breath.   Cardiovascular:  Negative for chest pain and leg swelling.  Gastrointestinal:  Negative for abdominal pain, blood in stool, constipation, diarrhea, nausea and vomiting.       BM every other day    Genitourinary:  Negative for dysuria and hematuria.  Neurological:  Negative for tingling and headaches.  Psychiatric/Behavioral:  Negative for hallucinations and suicidal ideas.       Objective:     BP 124/80   Pulse 60   Temp 97.7 F (36.5 C) (Oral)   Ht 5' 4.5" (1.638 m)   Wt 229 lb 12.8 oz (104.2 kg)   SpO2 98%   BMI 38.84 kg/m  BP Readings from Last 3 Encounters:  04/15/23 124/80  01/15/23 110/60  01/02/23 138/76   Wt Readings from Last 3 Encounters:  04/15/23 229 lb 12.8 oz (104.2 kg)  01/15/23 230 lb 8 oz (104.6 kg)  01/02/23 233 lb (105.7 kg)   SpO2 Readings from Last 3 Encounters:  04/15/23 98%  01/15/23 97%  01/02/23 96%      Physical Exam Vitals and nursing note reviewed.  Constitutional:      Appearance: Normal appearance.  HENT:     Right Ear: Tympanic membrane, ear canal and external ear normal.     Left Ear: Tympanic membrane, ear canal and external ear normal.     Mouth/Throat:     Mouth: Mucous membranes are moist.     Pharynx: Oropharynx is clear.  Eyes:     Extraocular Movements: Extraocular movements intact.     Pupils: Pupils are equal, round, and reactive to light.  Cardiovascular:     Rate and Rhythm: Normal rate and regular rhythm.     Pulses: Normal pulses.     Heart  sounds: Normal heart sounds.  Pulmonary:     Effort: Pulmonary effort is normal.     Breath sounds: Normal breath sounds.  Abdominal:     General: Bowel sounds are normal. There is no distension.     Palpations: There is no mass.     Tenderness: There is no abdominal tenderness.     Hernia: No hernia is present.  Musculoskeletal:     Right lower leg: Edema present.     Left lower leg: Edema present.  Lymphadenopathy:     Cervical: No cervical adenopathy.  Skin:    General: Skin is warm.  Neurological:     General: No focal deficit present.     Mental Status: He is alert.     Comments: Bilateral upper and lower extremity strength 5/5  Psychiatric:         Mood and Affect: Mood normal.        Behavior: Behavior normal.        Thought Content: Thought content normal.        Judgment: Judgment normal.     Title   Diabetic Foot Exam - detailed Is there a history of foot ulcer?: No Is there a foot ulcer now?: No Is there swelling?: No Is there elevated skin temperature?: No Is there abnormal foot shape?: No Is there a claw toe deformity?: No Are the toenails long?: Yes Are the toenails thick?: Yes Are the toenails ingrown?: No Is the skin thin, fragile, shiny and hairless?": No Pulse Foot Exam completed.: Yes   Right Posterior Tibialis: Diminished Left posterior Tibialis: Diminished   Right Dorsalis Pedis: Present Left Dorsalis Pedis: Present     Semmes-Weinstein Monofilament Test "+" means "has sensation" and "-" means "no sensation"      Image components are not supported.   Image components are not supported. Image components are not supported.  [place] Comments Right absent 2,5 7  Left absent on 3    Results for orders placed or performed in visit on 04/15/23  POCT glycosylated hemoglobin (Hb A1C)  Result Value Ref Range   Hemoglobin A1C 5.9 (A) 4.0 - 5.6 %   HbA1c POC (<> result, manual entry)     HbA1c, POC (prediabetic range)     HbA1c, POC (controlled diabetic range)        The ASCVD Risk score (Arnett DK, et al., 2019) failed to calculate for the following reasons:   The 2019 ASCVD risk score is only valid for ages 26 to 33    Assessment & Plan:   Problem List Items Addressed This Visit       Respiratory   OSA on CPAP   Continue using CPAP therapy        Endocrine   Hypogonadism in male   Maintained on 100 mg testosterone once monthly.  Continue      Type 2 diabetes mellitus without complication, without long-term current use of insulin (HCC) - Primary   Patient's A1c 5.9%.  Maintain lifestyle modifications only continue.  Patient has been consciously wanting to lose weight is down  approximately 50 pounds      Relevant Orders   POCT glycosylated hemoglobin (Hb A1C) (Completed)   CBC   Comprehensive metabolic panel   Lipid panel   Microalbumin / creatinine urine ratio   Hypothyroidism   History Maintained on levothyroxine 100 mcg daily.  Pending TSH      Relevant Orders   TSH     Other  MGUS (monoclonal gammopathy of unknown significance)   History of gout   Patient currently maintained on allopurinol 300 mg daily.  Continue pending uric acid and CMP      Relevant Orders   Uric acid   Preventative health care   Discussed age-appropriate immunizations and screening exams.  Did review patient's personal, surgical, social, family histories.  Patient up-to-date on all age-appropriate vaccinations he would like.  Patient has shingles vaccine schedule at pharmacy.  Patient is aged out for [facility] screening.  Does not want participate in prostate cancer screening.  Patient was given information at discharge about preventative healthcare maintenance with anticipatory guidance.      Obesity (BMI 30-39.9)   Continue working gradual weight loss.  Pending TSH and lipid panel      Vitamin D deficiency   History of the same with repletion.  Pending vitamin D level today      Relevant Orders   VITAMIN D 25 Hydroxy (Vit-D Deficiency, Fractures)    Return in about 6 months (around 10/16/2023) for DM recheck.    Audria Nine, NP

## 2023-04-16 ENCOUNTER — Encounter: Payer: Self-pay | Admitting: Nurse Practitioner

## 2023-04-16 ENCOUNTER — Other Ambulatory Visit: Payer: Self-pay | Admitting: Nurse Practitioner

## 2023-04-16 DIAGNOSIS — R809 Proteinuria, unspecified: Secondary | ICD-10-CM

## 2023-04-16 MED ORDER — EMPAGLIFLOZIN 10 MG PO TABS: 10.0000 mg | ORAL_TABLET | Freq: Every day | ORAL | 1 refills | Status: DC

## 2023-04-16 NOTE — Telephone Encounter (Signed)
 Pharmacy Patient Advocate Encounter  Received notification from [facility] that Prior Authorization for Testosterone Cypionate 200MG /ML intramuscular solution has been DENIED.  Full denial letter will be uploaded to the media tab. See denial reason below.   PA #/Case ID/Reference #: [id number]

## 2023-04-17 NOTE — Telephone Encounter (Signed)
 Patient has hypogonadism this should be approved

## 2023-04-18 ENCOUNTER — Telehealth: Payer: Self-pay | Admitting: Pharmacist

## 2023-04-18 NOTE — Telephone Encounter (Signed)
 E-Appeal has been submitted for testosterone cypionate. Will advise when response is received, please be advised that most companies may take 30 days to make a decision. Appeal letter and supporting documentation have been uploaded and submitted via [employer]MM Website on 04/18/2023 @12 :09 PM  Thank you, Dellie Burns, PharmD Clinical Pharmacist  [place]  Direct Dial: [phone]

## 2023-04-18 NOTE — Telephone Encounter (Signed)
 Left detailed voicemail for patient to call the office back.

## 2023-04-18 NOTE — Telephone Encounter (Signed)
 How do we do an appeal?

## 2023-04-23 ENCOUNTER — Other Ambulatory Visit (HOSPITAL_COMMUNITY): Payer: Self-pay

## 2023-04-23 ENCOUNTER — Encounter: Payer: Self-pay | Admitting: Oncology

## 2023-04-23 NOTE — Telephone Encounter (Signed)
 Routing to appeals [facility]

## 2023-04-23 NOTE — Telephone Encounter (Signed)
 Appeal was approved. See other encounter.

## 2023-04-23 NOTE — Telephone Encounter (Signed)
 See other encounter regarding appeals.

## 2023-04-23 NOTE — Telephone Encounter (Signed)
 Pharmacy Patient Advocate Encounter  Received notification from [facility] that APPEAL for TESTOSTERONE has been APPROVED from 01/29/23 to 01/28/24. Ran test claim, Copay is $24. This test claim was processed through [facility]- copay amounts may vary at other pharmacies due to pharmacy/plan contracts, or as the patient moves through the different stages of their insurance plan.   PA #/Case ID/Reference #: [id number]

## 2023-04-24 NOTE — Telephone Encounter (Signed)
 Left detailed voicemail for patient to call the office back if any concerns.

## 2023-04-29 NOTE — Telephone Encounter (Signed)
 Copied from CRM [id number]. Topic: General - Other >> Apr 29, 2023 12:31 PM Cammy Copa D wrote: Reason for CRM: PT's wife wants to let the office know that he is continuing that regimen of medication (Testosterone Shot). It has been stopped since around Feb. Due to insurance denial, but is not approved by insurance. Scheduled his first visit 4/24 to administer Testosterone Shot. Mrs. Sthilaire states that typically they are scheduled every 28 days for 6 months.

## 2023-05-05 ENCOUNTER — Encounter: Payer: Self-pay | Admitting: Cardiology

## 2023-05-05 ENCOUNTER — Ambulatory Visit: Payer: Medicare PPO | Attending: Cardiology | Admitting: Cardiology

## 2023-05-05 VITALS — BP 124/62 | HR 64 | Ht 64.5 in | Wt 232.8 lb

## 2023-05-05 DIAGNOSIS — E782 Mixed hyperlipidemia: Secondary | ICD-10-CM | POA: Diagnosis not present

## 2023-05-05 DIAGNOSIS — I2510 Atherosclerotic heart disease of native coronary artery without angina pectoris: Secondary | ICD-10-CM

## 2023-05-05 DIAGNOSIS — I10 Essential (primary) hypertension: Secondary | ICD-10-CM | POA: Diagnosis not present

## 2023-05-05 NOTE — Patient Instructions (Signed)
 Medication Instructions:  Your physician recommends that you continue on your current medications as directed. Please refer to the Current Medication list given to you today.  *If you need a refill on your cardiac medications before your next appointment, please call your pharmacy*  Lab Work: none If you have labs (blood work) drawn today and your tests are completely normal, you will receive your results only by: MyChart Message (if you have MyChart) OR A paper copy in the mail If you have any lab test that is abnormal or we need to change your treatment, we will call you to review the results.  Testing/Procedures: none  Follow-Up: At [facility], you and your health needs are our priority.  As part of our continuing mission to provide you with exceptional heart care, our providers are all part of one team.  This team includes your primary Cardiologist (physician) and Advanced Practice Providers or APPs (Physician Assistants and Nurse Practitioners) who all work together to provide you with the care you need, when you need it.  Your next appointment:   12 month(s)  Provider:   Yates Decamp, MD     We recommend signing up for the patient portal called "MyChart".  Sign up information is provided on this After Visit Summary.  MyChart is used to connect with patients for Virtual Visits (Telemedicine).  Patients are able to view lab/test results, encounter notes, upcoming appointments, etc.  Non-urgent messages can be sent to your provider as well.   To learn more about what you can do with MyChart, go to [email or web address].   Other Instructions        1st Floor: - Lobby - Registration  - Pharmacy  - Lab - Cafe  2nd Floor: - PV Lab - Diagnostic Testing (echo, CT, nuclear med)  3rd Floor: - Vacant  4th Floor: - TCTS (cardiothoracic surgery) - AFib Clinic - Structural Heart Clinic - Vascular Surgery  - Vascular Ultrasound  5th Floor: - [facility] Cardiology  (general and EP) - Clinical Pharmacy for coumadin, hypertension, lipid, weight-loss medications, and med management appointments    Valet parking services will be available as well.

## 2023-05-05 NOTE — Progress Notes (Signed)
 Cardiology Office Note:  .   Date:  05/05/2023  ID:  [name] [name], DOB 05-26-1932, MRN [id number] PCP: Eden Emms, NP  [facility] Providers Cardiologist:  Yates Decamp, MD   History of Present Illness: .   [name] [name] is a 89 y.o. Caucasian male with coronary disease with CABG in 2012, remote carotid enterectomy on the left several years prior to CABG, hypertension, mixed hyperlipidemia, hyperglycemia, h/o gout, OSA on CPAP, former smoker, morbid obesity, multiple myeloma in regression/remission and recommended annual SPEP.  He had turned diabetic a year ago, March 2024 and hence was started on Jardiance 2 months ago.  He now presents for 71-month follow-up of hypertension, hyperlipidemia and coronary artery disease.  His last major surgery was 03/12/2021 where he underwent right knee replacement without periprocedural cardiac complications.  Discussed the use of AI scribe software for clinical note transcription with the patient, who gave verbal consent to proceed.  History of Present Illness   The patient, with a history of multiple back surgeries and arthritis, presents with chronic back pain and constipation. He describes the back pain as severe and believes it is related to his bowel movements. He reports difficulty with bowel movements, feeling constipated 'all the time.' Despite taking Colace, a stool softener, he has not noticed an improvement in his symptoms.  In addition to his back pain and constipation, the patient also has a history of heart disease and has undergone bypass surgery. He has also had a carotid artery procedure due to a 'kink' in the artery. He is currently on Jardiance for borderline diabetes, which has helped control his blood sugar levels.      Labs   Lab Results  Component Value Date   CHOL 99 04/15/2023   HDL 33.00 (L) 04/15/2023   LDLCALC 44 04/15/2023   TRIG 112.0 04/15/2023   CHOLHDL 3 04/15/2023   Lab Results  Component Value Date   NA  138 04/15/2023   K 5.1 04/15/2023   CO2 24 04/15/2023   GLUCOSE 117 (H) 04/15/2023   BUN 34 (H) 04/15/2023   CREATININE 1.08 04/15/2023   CALCIUM 9.6 04/15/2023   GFR 61.33 04/15/2023   EGFR 60 04/12/2021   GFRNONAA >60 03/13/2021      Latest Ref Rng & Units 04/15/2023   10:59 AM 10/15/2022   11:12 AM 04/12/2022    9:25 AM  BMP  Glucose 70 - 99 mg/dL 595  638  756   BUN 6 - 23 mg/dL 34  29  36   Creatinine 0.40 - 1.50 mg/dL 4.33  2.95  1.88   Sodium 135 - 145 mEq/L 138  137  139   Potassium 3.5 - 5.1 mEq/L 5.1  4.9  4.8   Chloride 96 - 112 mEq/L 106  104  107   CO2 19 - 32 mEq/L 24  28  26    Calcium 8.4 - 10.5 mg/dL 9.6  9.8  9.3       Latest Ref Rng & Units 04/15/2023   10:59 AM 10/15/2022   11:12 AM 07/16/2022    8:51 AM  CBC  WBC 4.0 - 10.5 K/uL 4.7  4.7  5.7   Hemoglobin 13.0 - 17.0 g/dL 41.6  60.6  30.1   Hematocrit 39.0 - 52.0 % 39.7  39.8  37.7   Platelets 150.0 - 400.0 K/uL 118.0  132.0  105.0    Lab Results  Component Value Date   HGBA1C 5.9 (A) 04/15/2023  Lab Results  Component Value Date   TSH 1.23 04/15/2023    Review of Systems  Cardiovascular:  Negative for chest pain, dyspnea on exertion and leg swelling.  Musculoskeletal:  Positive for back pain.   Physical Exam:   VS:  BP 124/62   Pulse 64   Ht 5' 4.5" (1.638 m)   Wt 232 lb 12.8 oz (105.6 kg)   SpO2 93%   BMI 39.34 kg/m    Wt Readings from Last 3 Encounters:  05/05/23 232 lb 12.8 oz (105.6 kg)  04/15/23 229 lb 12.8 oz (104.2 kg)  01/15/23 230 lb 8 oz (104.6 kg)    Physical Exam Neck:     Vascular: No carotid bruit or JVD.  Cardiovascular:     Rate and Rhythm: Normal rate and regular rhythm.     Pulses: Intact distal pulses.     Heart sounds: Normal heart sounds. No murmur heard.    No gallop.  Pulmonary:     Effort: Pulmonary effort is normal.     Breath sounds: Normal breath sounds.  Abdominal:     General: Bowel sounds are normal.     Palpations: Abdomen is soft.   Musculoskeletal:     Right lower leg: Edema (1-2 + below knee pitting edema) present.     Left lower leg: No edema.    Studies Reviewed: .    Cardiac Cath: 04/05/2010:  3 Vessel CAD; RCA 99% mid; Mid Cx; 80%, Ostial LAD 80%, Mid 90% S/P CABG  x5  04/09/10  LIMA to the LAD, SVG to D1, SVG to OM1 & OM3, SVG to Rt PDA.  EKG:    EKG 11/21/2022: Normal sinus rhythm at the rate of 64 bpm, incomplete right bundle branch block.  Normal EKG.  Medications and allergies    Allergies  Allergen Reactions   Amlodipine Swelling   Ciprofloxacin Nausea Only   Hydralazine Other (See Comments)    Leg edema   Hydrocodone Nausea Only    Ok taking in low doses(5mg ), has nausea with higher doses   Levofloxacin Other (See Comments)    Pain/cramping    Oxycodone Nausea Only   Tramadol Nausea Only   Cardizem [Diltiazem Hcl] Rash     Current Outpatient Medications:    acetaminophen (TYLENOL) 650 MG CR tablet, Take 650 mg by mouth every 8 (eight) hours as needed for pain., Disp: , Rfl:    allopurinol (ZYLOPRIM) 300 MG tablet, TAKE 1 TABLET BY MOUTH EVERY DAY, Disp: 90 tablet, Rfl: 1   aspirin EC 81 MG tablet, Take 81 mg by mouth daily. Swallow whole., Disp: , Rfl:    cetirizine (ZYRTEC) 10 MG tablet, Take 10 mg by mouth daily as needed for allergies., Disp: , Rfl:    cromolyn (OPTICROM) 4 % ophthalmic solution, Place 1 drop into both eyes in the morning and at bedtime., Disp: , Rfl: 2   docusate sodium (COLACE) 100 MG capsule, Take 100 mg by mouth daily as needed for moderate constipation., Disp: , Rfl:    empagliflozin (JARDIANCE) 10 MG TABS tablet, Take 1 tablet (10 mg total) by mouth daily before breakfast., Disp: 90 tablet, Rfl: 1   hydrochlorothiazide (HYDRODIURIL) 25 MG tablet, TAKE 1 TABLET BY MOUTH EVERY DAY, Disp: 90 tablet, Rfl: 0   isosorbide mononitrate (IMDUR) 60 MG 24 hr tablet, TAKE 1 TABLET BY MOUTH EVERY DAY, Disp: 90 tablet, Rfl: 3   labetalol (NORMODYNE) 200 MG tablet, TAKE 1 TABLET  BY MOUTH TWICE A DAY,  Disp: 180 tablet, Rfl: 2   levothyroxine (SYNTHROID) 100 MCG tablet, TAKE 1 TABLET BY MOUTH DAILY BEFORE BREAKFAST., Disp: 90 tablet, Rfl: 3   losartan (COZAAR) 100 MG tablet, Take 1 tablet (100 mg total) by mouth daily., Disp: 90 tablet, Rfl: 2   methocarbamol (ROBAXIN) 500 MG tablet, Take 1 tablet (500 mg total) by mouth every 6 (six) hours as needed for muscle spasms., Disp: 40 tablet, Rfl: 0   Multiple Vitamin (MULTIVITAMIN) tablet, Take 1 tablet by mouth daily., Disp: , Rfl:    Multiple Vitamins-Minerals (PRESERVISION AREDS PO), Take 1 capsule by mouth in the morning and at bedtime., Disp: , Rfl:    nitroGLYCERIN (NITROSTAT) 0.4 MG SL tablet, Place 1 tablet (0.4 mg total) under the tongue every 5 (five) minutes as needed for chest pain. If your symptoms continue after taking two tablets 5 minutes apart call 911., Disp: 90 tablet, Rfl: 3   Omega-3 Fatty Acids (FISH OIL) 1000 MG CAPS, Take 1 capsule by mouth daily at 12 noon., Disp: , Rfl:    Polyethyl Glycol-Propyl Glycol (SYSTANE OP), Place 1 drop into both eyes daily as needed (dry eyes)., Disp: , Rfl:    rosuvastatin (CRESTOR) 20 MG tablet, TAKE 1 TABLET BY MOUTH EVERYDAY AT BEDTIME, Disp: 90 tablet, Rfl: 1   spironolactone (ALDACTONE) 25 MG tablet, TAKE 1 TABLET (25 MG TOTAL) BY MOUTH IN THE MORNING, Disp: 90 tablet, Rfl: 2   testosterone cypionate (DEPOTESTOSTERONE CYPIONATE) 200 MG/ML injection, Inject 1 mL (200 mg total) into the muscle every 28 (twenty-eight) days., Disp: 3 mL, Rfl: 0  Current Facility-Administered Medications:    testosterone cypionate (DEPOTESTOSTERONE CYPIONATE) injection 200 mg, 200 mg, Intramuscular, Q28 days, Cable, James M, NP, 200 mg at 01/25/22 1200   testosterone cypionate (DEPOTESTOSTERONE CYPIONATE) injection 200 mg, 200 mg, Intramuscular, Q28 days, Cable, James M, NP, 200 mg at 02/26/22 1032   testosterone cypionate (DEPOTESTOSTERONE CYPIONATE) injection 200 mg, 200 mg, Intramuscular,  Q28 days, Cable, James M, NP   testosterone cypionate (DEPOTESTOSTERONE CYPIONATE) injection 200 mg, 200 mg, Intramuscular, Q28 days, Cable, James M, NP, 200 mg at 05/21/22 1038   testosterone cypionate (DEPOTESTOSTERONE CYPIONATE) injection 200 mg, 200 mg, Intramuscular, Q28 days, Joaquim Nam, MD, 200 mg at 10/15/22 1108   testosterone cypionate (DEPOTESTOSTERONE CYPIONATE) injection 200 mg, 200 mg, Intramuscular, Q28 days, Cable, James M, NP, 200 mg at 11/14/22 1003   testosterone cypionate (DEPOTESTOSTERONE CYPIONATE) injection 200 mg, 200 mg, Intramuscular, Q28 days, Copland, Spencer, MD, 200 mg at 01/15/23 1127   Medications Discontinued During This Encounter  Medication Reason   predniSONE (DELTASONE) 20 MG tablet Patient has not taken in last 30 days   Vitamin D, Ergocalciferol, (DRISDOL) 1.25 MG (50000 UNIT) CAPS capsule Patient Preference     ASSESSMENT AND PLAN: .      ICD-10-CM   1. Coronary artery disease involving native coronary artery of native heart without angina pectoris  I25.10     2. Primary hypertension  I10     3. Mixed hyperlipidemia  E78.2      1. Coronary artery disease involving native coronary artery of native heart without angina pectoris Patient remained stable without recurrence of angina pectoris, presently on aspirin, Imdur, labetalol and losartan.  No change in his EKG done 6 months ago which is essentially normal,  2. Primary hypertension Blood pressure is well-controlled on the above medications as well.  3. Mixed hyperlipidemia I reviewed his external labs, lipid profile done in March 2024 revealing  excellent control of both triglycerides and LDL.  Continue Crestor 20 mg daily.  Overall he remains stable from cardiac standpoint, I will see him back in a year.  Due to elevated A1c, he was started on Jardiance, since then his leg edema is completely resolved except for mild right leg edema due to prior vein harvest for CABG.  No clinical evidence  of heart failure.  Continue present management.  I will see him back in a year.   Signed,  Yates Decamp, MD, [facility] 05/05/2023, 1:53 PM [facility] [address] [place], [place] [address] Phone: [phone]. Fax:  [phone]

## 2023-05-12 ENCOUNTER — Telehealth: Payer: Self-pay

## 2023-05-12 ENCOUNTER — Other Ambulatory Visit

## 2023-05-12 ENCOUNTER — Other Ambulatory Visit: Payer: Self-pay | Admitting: Nurse Practitioner

## 2023-05-12 DIAGNOSIS — E291 Testicular hypofunction: Secondary | ICD-10-CM | POA: Diagnosis not present

## 2023-05-12 LAB — TESTOSTERONE: Testosterone: 193.95 ng/dL — ABNORMAL LOW (ref 300.00–890.00)

## 2023-05-12 NOTE — Telephone Encounter (Signed)
 Copied from CRM [id number]. Topic: General - Call Back - No Documentation >> May 12, 2023 12:24 PM [name] wrote: Reason for CRM: Patient spouse [name] [name] requested a call back at [phone], she wants to speak to [facility]

## 2023-05-13 ENCOUNTER — Encounter: Payer: Self-pay | Admitting: Nurse Practitioner

## 2023-05-15 ENCOUNTER — Telehealth: Payer: Self-pay

## 2023-05-15 NOTE — Telephone Encounter (Signed)
 Pt has NV scheduled for 05/22/23 for testosterone injection. Just wanted to clarify med list has testosterone 200 mg IM q 28 days and 04/12/23 office note has testosterone 100 mg IM q 28 days. Which do you want pt to have?

## 2023-05-15 NOTE — Telephone Encounter (Signed)
 Noted and thank you

## 2023-05-15 NOTE — Telephone Encounter (Signed)
 That was my mistake 200 mg a month

## 2023-05-22 ENCOUNTER — Ambulatory Visit

## 2023-05-22 DIAGNOSIS — E291 Testicular hypofunction: Secondary | ICD-10-CM | POA: Diagnosis not present

## 2023-05-22 MED ORDER — TESTOSTERONE CYPIONATE 200 MG/ML IM SOLN
200.0000 mg | Freq: Once | INTRAMUSCULAR | Status: AC
  Administered 2023-05-22: 200 mg via INTRAMUSCULAR

## 2023-05-22 NOTE — Progress Notes (Signed)
 Per orders of Winthrop Hawks, NP , injection of Testosterone   200 mg given by Rona Cobia in Left Anterior Thigh  Patient tolerated injection well. Patient will make appointment for 1 month.

## 2023-06-05 ENCOUNTER — Encounter (HOSPITAL_COMMUNITY): Payer: Self-pay

## 2023-06-12 ENCOUNTER — Ambulatory Visit

## 2023-06-19 ENCOUNTER — Ambulatory Visit (INDEPENDENT_AMBULATORY_CARE_PROVIDER_SITE_OTHER)

## 2023-06-19 DIAGNOSIS — E291 Testicular hypofunction: Secondary | ICD-10-CM | POA: Diagnosis not present

## 2023-06-19 MED ORDER — TESTOSTERONE CYPIONATE 200 MG/ML IM SOLN
200.0000 mg | Freq: Once | INTRAMUSCULAR | Status: AC
Start: 2023-06-19 — End: 2023-06-19
  Administered 2023-06-19: 200 mg via INTRAMUSCULAR

## 2023-06-19 NOTE — Progress Notes (Signed)
 Per orders of Winthrop Hawks, NP , injection of Testosterone   given by Kris Pester in right anterior thigh. Patient tolerated injection well.

## 2023-06-27 DIAGNOSIS — H04123 Dry eye syndrome of bilateral lacrimal glands: Secondary | ICD-10-CM | POA: Diagnosis not present

## 2023-06-27 DIAGNOSIS — H40013 Open angle with borderline findings, low risk, bilateral: Secondary | ICD-10-CM | POA: Diagnosis not present

## 2023-06-27 DIAGNOSIS — H34232 Retinal artery branch occlusion, left eye: Secondary | ICD-10-CM | POA: Diagnosis not present

## 2023-06-27 DIAGNOSIS — Z961 Presence of intraocular lens: Secondary | ICD-10-CM | POA: Diagnosis not present

## 2023-06-27 DIAGNOSIS — H0102A Squamous blepharitis right eye, upper and lower eyelids: Secondary | ICD-10-CM | POA: Diagnosis not present

## 2023-06-27 DIAGNOSIS — H4912 Fourth [trochlear] nerve palsy, left eye: Secondary | ICD-10-CM | POA: Diagnosis not present

## 2023-06-27 DIAGNOSIS — H43813 Vitreous degeneration, bilateral: Secondary | ICD-10-CM | POA: Diagnosis not present

## 2023-06-27 DIAGNOSIS — H0102B Squamous blepharitis left eye, upper and lower eyelids: Secondary | ICD-10-CM | POA: Diagnosis not present

## 2023-06-27 DIAGNOSIS — D492 Neoplasm of unspecified behavior of bone, soft tissue, and skin: Secondary | ICD-10-CM | POA: Diagnosis not present

## 2023-06-30 ENCOUNTER — Telehealth: Payer: Self-pay | Admitting: Cardiology

## 2023-06-30 DIAGNOSIS — H3410 Central retinal artery occlusion, unspecified eye: Secondary | ICD-10-CM

## 2023-06-30 NOTE — Telephone Encounter (Addendum)
 Pt had eye exam on 5/30 and was dx with Artery Occulusion. They states he needs Echo and Carotid> Please advise    ICD-10-CM   1. Central retinal artery occlusion, unspecified laterality  H34.10 ECHOCARDIOGRAM COMPLETE    VAS US  CAROTID     Orders Placed This Encounter  Procedures   ECHOCARDIOGRAM COMPLETE    Standing Status:   Future    Expected Date:   07/31/2023    Expiration Date:   10/01/2023    Where [facility] this test [facility] performed[facility]    Does the patient weigh less than or greater than 250 lbs?:   Patient weighs less than 250 lbs    Perflutren DEFINITY (image enhancing agent) should be administered unless hypersensitivity or allergy exist:   Administer Perflutren    Reason for exam-Echo:   Other-Full Diagnosis List    Full ICD-10/Reason for Exam:   Central retinal artery occlusion [161096]        Knox Perl, MD, [facility] 07/01/2023, 1:19 PM [facility] [address] [place], [place] [address] Phone: [phone]. Fax:  [phone]

## 2023-07-01 NOTE — Telephone Encounter (Signed)
 Echo is scheduled for 07/15/23 at 1115.  Pt made aware of appt date and time by Echo Scheduler.   Carotids pending to be scheduled.

## 2023-07-01 NOTE — Telephone Encounter (Signed)
 [name] Perl, MD  [facility] Triage Caller: Unspecified Carolin Chyle,  2:43 PM) Placed orders  Will send a staff message to both our Echo Scheduler and PV Scheduler, to reach out to the pt to arrange echo/carotids.

## 2023-07-02 NOTE — Telephone Encounter (Signed)
 Carotid scheduled for 06/24 at 2:00PM

## 2023-07-03 NOTE — Telephone Encounter (Signed)
 RE: CAROTIDS PER DR. Berry Bristol Received: Today Daniel Durand L    PATIENT SCHEDULED  07/22/23

## 2023-07-09 DIAGNOSIS — Z85820 Personal history of malignant melanoma of skin: Secondary | ICD-10-CM | POA: Diagnosis not present

## 2023-07-09 DIAGNOSIS — Z1283 Encounter for screening for malignant neoplasm of skin: Secondary | ICD-10-CM | POA: Diagnosis not present

## 2023-07-09 DIAGNOSIS — D225 Melanocytic nevi of trunk: Secondary | ICD-10-CM | POA: Diagnosis not present

## 2023-07-09 DIAGNOSIS — Z08 Encounter for follow-up examination after completed treatment for malignant neoplasm: Secondary | ICD-10-CM | POA: Diagnosis not present

## 2023-07-09 DIAGNOSIS — C44619 Basal cell carcinoma of skin of left upper limb, including shoulder: Secondary | ICD-10-CM | POA: Diagnosis not present

## 2023-07-09 DIAGNOSIS — L570 Actinic keratosis: Secondary | ICD-10-CM | POA: Diagnosis not present

## 2023-07-09 DIAGNOSIS — X32XXXD Exposure to sunlight, subsequent encounter: Secondary | ICD-10-CM | POA: Diagnosis not present

## 2023-07-12 ENCOUNTER — Other Ambulatory Visit: Payer: Self-pay | Admitting: Nurse Practitioner

## 2023-07-12 DIAGNOSIS — D472 Monoclonal gammopathy: Secondary | ICD-10-CM

## 2023-07-12 DIAGNOSIS — I2510 Atherosclerotic heart disease of native coronary artery without angina pectoris: Secondary | ICD-10-CM

## 2023-07-15 ENCOUNTER — Other Ambulatory Visit: Payer: Self-pay

## 2023-07-15 ENCOUNTER — Ambulatory Visit (HOSPITAL_COMMUNITY)
Admission: RE | Admit: 2023-07-15 | Discharge: 2023-07-15 | Disposition: A | Discharge status: Home / Self Care | Source: Ambulatory Visit | Attending: Cardiovascular Disease | Admitting: Cardiovascular Disease

## 2023-07-15 ENCOUNTER — Ambulatory Visit: Payer: Self-pay | Admitting: Cardiology

## 2023-07-15 DIAGNOSIS — E291 Testicular hypofunction: Secondary | ICD-10-CM

## 2023-07-15 DIAGNOSIS — H3410 Central retinal artery occlusion, unspecified eye: Secondary | ICD-10-CM | POA: Diagnosis not present

## 2023-07-15 LAB — ECHOCARDIOGRAM COMPLETE: S' Lateral: 2.55 cm

## 2023-07-15 MED ORDER — HYDROCHLOROTHIAZIDE 25 MG PO TABS: 25.0000 mg | ORAL_TABLET | Freq: Every day | ORAL | 3 refills | Status: AC

## 2023-07-15 MED ORDER — PERFLUTREN LIPID MICROSPHERE
1.0000 mL | INTRAVENOUS | Status: AC | PRN
  Administered 2023-07-15: 4 mL via INTRAVENOUS

## 2023-07-15 NOTE — Progress Notes (Signed)
 Normal LV systolic function, moderate LVH from long time hypertension.  No other significant abnormality, fairly stable echocardiogram compared to prior echocardiogram done in 2021.  Carotid artery duplex is pending and will discuss more once that is available.  Patient was seen in the emergency room with central retinal artery occlusion, echocardiogram does not explain this.

## 2023-07-17 ENCOUNTER — Ambulatory Visit (INDEPENDENT_AMBULATORY_CARE_PROVIDER_SITE_OTHER)

## 2023-07-17 ENCOUNTER — Other Ambulatory Visit: Payer: Self-pay | Admitting: Nurse Practitioner

## 2023-07-17 ENCOUNTER — Other Ambulatory Visit

## 2023-07-17 ENCOUNTER — Encounter: Payer: Self-pay | Admitting: Nurse Practitioner

## 2023-07-17 DIAGNOSIS — R809 Proteinuria, unspecified: Secondary | ICD-10-CM

## 2023-07-17 DIAGNOSIS — E291 Testicular hypofunction: Secondary | ICD-10-CM | POA: Diagnosis not present

## 2023-07-17 LAB — MICROALBUMIN / CREATININE URINE RATIO
Creatinine,U: 66.7 mg/dL
Microalb Creat Ratio: 63.9 mg/g — ABNORMAL HIGH (ref 0.0–30.0)
Microalb, Ur: 4.3 mg/dL — ABNORMAL HIGH (ref 0.0–1.9)

## 2023-07-17 LAB — BASIC METABOLIC PANEL WITH GFR
BUN: 39 mg/dL — ABNORMAL HIGH (ref 6–23)
CO2: 23 meq/L (ref 19–32)
Calcium: 9.7 mg/dL (ref 8.4–10.5)
Chloride: 107 meq/L (ref 96–112)
Creatinine, Ser: 1.31 mg/dL (ref 0.40–1.50)
GFR: 48.56 mL/min — ABNORMAL LOW (ref >60.00)
Glucose, Bld: 99 mg/dL (ref 70–99)
Potassium: 4.5 meq/L (ref 3.5–5.1)
Sodium: 136 meq/L (ref 135–145)

## 2023-07-17 MED ORDER — TESTOSTERONE CYPIONATE 200 MG/ML IM SOLN
200.0000 mg | INTRAMUSCULAR | 0 refills | Status: DC
Start: 2023-07-17 — End: 2023-10-17

## 2023-07-17 MED ORDER — TESTOSTERONE CYPIONATE 200 MG/ML IM SOLN
200.0000 mg | Freq: Once | INTRAMUSCULAR | Status: AC
  Administered 2023-07-17: 200 mg via INTRAMUSCULAR

## 2023-07-17 NOTE — Telephone Encounter (Signed)
 Pt was in office today for labs and testosterone  inj. Requests refill for testosterone .

## 2023-07-17 NOTE — Progress Notes (Signed)
 Per orders of Winthrop Hawks, NP , injection of Testosterone   given by Kris Pester in left thigh. Patient tolerated injection well.

## 2023-07-18 ENCOUNTER — Ambulatory Visit: Payer: Self-pay | Admitting: Nurse Practitioner

## 2023-07-22 ENCOUNTER — Ambulatory Visit (HOSPITAL_COMMUNITY)
Admission: RE | Admit: 2023-07-22 | Discharge: 2023-07-22 | Disposition: A | Discharge status: Home / Self Care | Source: Ambulatory Visit | Attending: Cardiology | Admitting: Cardiology

## 2023-07-22 DIAGNOSIS — H3412 Central retinal artery occlusion, left eye: Secondary | ICD-10-CM | POA: Diagnosis not present

## 2023-07-22 DIAGNOSIS — H3410 Central retinal artery occlusion, unspecified eye: Secondary | ICD-10-CM | POA: Diagnosis not present

## 2023-07-22 NOTE — Progress Notes (Signed)
 Carotid duplex revealing only minimal disease no further evaluation is needed.

## 2023-07-22 NOTE — Progress Notes (Signed)
 Can we call his PCP and see if they would take over. He had no PCP for a while when Jim Little retired and I did this so he wont miss the doses.

## 2023-08-19 ENCOUNTER — Ambulatory Visit (INDEPENDENT_AMBULATORY_CARE_PROVIDER_SITE_OTHER)

## 2023-08-19 ENCOUNTER — Ambulatory Visit

## 2023-08-19 DIAGNOSIS — E291 Testicular hypofunction: Secondary | ICD-10-CM

## 2023-08-19 MED ORDER — TESTOSTERONE CYPIONATE 200 MG/ML IM SOLN
200.0000 mg | Freq: Once | INTRAMUSCULAR | Status: AC
  Administered 2023-08-19: 200 mg via INTRAMUSCULAR

## 2023-08-19 NOTE — Progress Notes (Signed)
 Per orders of Lynwood Crandall, NP , injection of testosterone  200 mg IM given by Laray Arenas in right anterior thigh. Patient tolerated injection well. Patient will make appointment for 1 month.

## 2023-08-20 DIAGNOSIS — H40013 Open angle with borderline findings, low risk, bilateral: Secondary | ICD-10-CM | POA: Diagnosis not present

## 2023-08-20 DIAGNOSIS — H34232 Retinal artery branch occlusion, left eye: Secondary | ICD-10-CM | POA: Diagnosis not present

## 2023-08-20 DIAGNOSIS — H0102B Squamous blepharitis left eye, upper and lower eyelids: Secondary | ICD-10-CM | POA: Diagnosis not present

## 2023-08-20 DIAGNOSIS — H04123 Dry eye syndrome of bilateral lacrimal glands: Secondary | ICD-10-CM | POA: Diagnosis not present

## 2023-08-20 DIAGNOSIS — H4912 Fourth [trochlear] nerve palsy, left eye: Secondary | ICD-10-CM | POA: Diagnosis not present

## 2023-08-20 DIAGNOSIS — H0102A Squamous blepharitis right eye, upper and lower eyelids: Secondary | ICD-10-CM | POA: Diagnosis not present

## 2023-08-20 DIAGNOSIS — H43813 Vitreous degeneration, bilateral: Secondary | ICD-10-CM | POA: Diagnosis not present

## 2023-08-20 DIAGNOSIS — D492 Neoplasm of unspecified behavior of bone, soft tissue, and skin: Secondary | ICD-10-CM | POA: Diagnosis not present

## 2023-09-15 ENCOUNTER — Ambulatory Visit (INDEPENDENT_AMBULATORY_CARE_PROVIDER_SITE_OTHER)

## 2023-09-15 DIAGNOSIS — E291 Testicular hypofunction: Secondary | ICD-10-CM | POA: Diagnosis not present

## 2023-09-15 MED ORDER — TESTOSTERONE CYPIONATE 200 MG/ML IM SOLN
200.0000 mg | Freq: Once | INTRAMUSCULAR | Status: AC
  Administered 2023-09-15: 200 mg via INTRAMUSCULAR

## 2023-09-15 NOTE — Progress Notes (Signed)
 Per orders of Mallie Gaskins, DPN AGNP-C, injection of Testosterone   given by Danna CINDERELLA Hummer in right  anterior thigh  . Patient tolerated injection well. Patient will make appointment for 28 days.

## 2023-09-16 ENCOUNTER — Ambulatory Visit

## 2023-09-26 ENCOUNTER — Other Ambulatory Visit: Payer: Self-pay | Admitting: Nurse Practitioner

## 2023-10-07 ENCOUNTER — Other Ambulatory Visit: Payer: Self-pay | Admitting: Cardiology

## 2023-10-07 ENCOUNTER — Other Ambulatory Visit: Payer: Self-pay | Admitting: Nurse Practitioner

## 2023-10-07 DIAGNOSIS — R809 Proteinuria, unspecified: Secondary | ICD-10-CM

## 2023-10-07 DIAGNOSIS — I10 Essential (primary) hypertension: Secondary | ICD-10-CM

## 2023-10-09 ENCOUNTER — Encounter: Payer: Self-pay | Admitting: Nurse Practitioner

## 2023-10-16 ENCOUNTER — Other Ambulatory Visit: Payer: Self-pay | Admitting: Nurse Practitioner

## 2023-10-16 ENCOUNTER — Ambulatory Visit: Admitting: Nurse Practitioner

## 2023-10-16 VITALS — BP 132/80 | HR 66 | Temp 98.6°F | Ht 64.5 in | Wt 223.4 lb

## 2023-10-16 DIAGNOSIS — E291 Testicular hypofunction: Secondary | ICD-10-CM

## 2023-10-16 DIAGNOSIS — N289 Disorder of kidney and ureter, unspecified: Secondary | ICD-10-CM | POA: Diagnosis not present

## 2023-10-16 DIAGNOSIS — Z7984 Long term (current) use of oral hypoglycemic drugs: Secondary | ICD-10-CM

## 2023-10-16 DIAGNOSIS — E119 Type 2 diabetes mellitus without complications: Secondary | ICD-10-CM | POA: Diagnosis not present

## 2023-10-16 DIAGNOSIS — E669 Obesity, unspecified: Secondary | ICD-10-CM | POA: Diagnosis not present

## 2023-10-16 LAB — BASIC METABOLIC PANEL WITH GFR
BUN: 48 mg/dL — ABNORMAL HIGH (ref 6–23)
CO2: 24 meq/L (ref 19–32)
Calcium: 9.5 mg/dL (ref 8.4–10.5)
Chloride: 104 meq/L (ref 96–112)
Creatinine, Ser: 1.55 mg/dL — ABNORMAL HIGH (ref 0.40–1.50)
GFR: 39.61 mL/min — ABNORMAL LOW (ref >60.00)
Glucose, Bld: 109 mg/dL — ABNORMAL HIGH (ref 70–99)
Potassium: 4.2 meq/L (ref 3.5–5.1)
Sodium: 136 meq/L (ref 135–145)

## 2023-10-16 LAB — POCT GLYCOSYLATED HEMOGLOBIN (HGB A1C): Hemoglobin A1C: 5.9 % — AB (ref 4.0–5.6)

## 2023-10-16 NOTE — Progress Notes (Signed)
 Established Patient Office Visit  Subjective   Patient ID: [name] [name], male    DOB: 26-Feb-1932  Age: 89 y.o. MRN: [id number]  Chief Complaint  Patient presents with   Diabetes    Discussed the use of AI scribe software for clinical note transcription with the patient, who gave verbal consent to proceed.  History of Present Illness [name] [name] is an 89 year old male who presents for a six-month follow-up visit.  He experienced a fall yesterday along with his wife due to her vertigo. He sustained a skinned knee, which was bleeding. He experiences falls occasionally but denies dizziness, lightheadedness, or head injury during the fall. His wife has chronic knee issues and wears a brace.  He is on Jardiance  for diabetes management, with a recent A1c of 5.9%. He experiences constipation, managed with a stool softener every other day, which maintains regularity. No urinary issues since starting Jardiance .  He has lost weight, currently at 223 pounds, down from 232 pounds at the last visit, attributing this to diet. He engages in building activities for exercise and ensures hydration by carrying water while working outside.  He has a history of sciatic nerve pain affecting his rump and leg, relieved by Tylenol  and a previously prescribed pain medication, though he does not recall the name. The pain medication can cause constipation.  He wears a big hat for sun protection due to a history of skin cancers removed from his back. He has an upcoming appointment with a dermatologist.     Review of Systems  Constitutional:  Negative for chills and fever.  Respiratory:  Negative for shortness of breath.   Cardiovascular:  Negative for chest pain.  Gastrointestinal:  Positive for constipation.  Neurological:  Negative for dizziness and headaches.      Objective:     BP 132/80   Pulse 66   Temp 98.6 F (37 C) (Oral)   Ht 5' 4.5 (1.638 m)   Wt 223 lb 6.4 oz (101.3 kg)   SpO2  98%   BMI 37.75 kg/m  BP Readings from Last 3 Encounters:  10/16/23 132/80  05/05/23 124/62  04/15/23 124/80   Wt Readings from Last 3 Encounters:  10/16/23 223 lb 6.4 oz (101.3 kg)  05/05/23 232 lb 12.8 oz (105.6 kg)  04/15/23 229 lb 12.8 oz (104.2 kg)   SpO2 Readings from Last 3 Encounters:  10/16/23 98%  05/05/23 93%  04/15/23 98%      Physical Exam Vitals and nursing note reviewed.  Constitutional:      Appearance: Normal appearance.  Cardiovascular:     Rate and Rhythm: Normal rate and regular rhythm.     Heart sounds: Normal heart sounds.  Pulmonary:     Effort: Pulmonary effort is normal.     Breath sounds: Normal breath sounds.  Abdominal:     General: Bowel sounds are normal.  Neurological:     Mental Status: He is alert.      No results found for any visits on 10/16/23.    The ASCVD Risk score (Arnett DK, et al., 2019) failed to calculate for the following reasons:   The 2019 ASCVD risk score is only valid for ages 51 to 74    Assessment & Plan:   Problem List Items Addressed This Visit       Endocrine   Hypogonadism in male   Type 2 diabetes mellitus without complication, without long-term current use of insulin (HCC) - Primary  Relevant Orders   POCT glycosylated hemoglobin (Hb A1C)   Basic metabolic panel with GFR   Other Visit Diagnoses       Decreased renal function       Relevant Orders   Basic metabolic panel with GFR     Assessment and Plan Assessment & Plan Chronic pain likely due to sciatica Chronic lower back and leg pain partially relieved by Tylenol  and prescribed medication. - Continue Tylenol  and prescribed pain medication.  Type 2 diabetes mellitus Well-controlled with A1c of 5.9% on Jardiance .  Obesity Weight reduced from 232 lbs to 223 lbs through diet and exercise.  Hypogonadism Managed with testosterone  injections. - Administer testosterone  injection today.  Constipation, medication-related Intermittent  constipation likely due to pain medication, managed with stool softeners.   Return in about 6 months (around 04/14/2024) for CPE and Labs.    Adina Crandall, NP

## 2023-10-16 NOTE — Patient Instructions (Signed)
Nice to see you today I will be in touch with the labs once I have reviewed them Follow up with me in 6 months, sooner if you need me

## 2023-10-17 ENCOUNTER — Ambulatory Visit: Payer: Self-pay | Admitting: Nurse Practitioner

## 2023-10-23 ENCOUNTER — Ambulatory Visit (INDEPENDENT_AMBULATORY_CARE_PROVIDER_SITE_OTHER): Payer: Medicare PPO

## 2023-10-23 VITALS — Ht 64.5 in | Wt 223.0 lb

## 2023-10-23 DIAGNOSIS — Z0000 Encounter for general adult medical examination without abnormal findings: Secondary | ICD-10-CM

## 2023-10-23 NOTE — Progress Notes (Signed)
 Subjective:   [name] [name] is a 89 y.o. who presents for a Medicare Wellness preventive visit.  As a reminder, Annual Wellness Visits don't include a physical exam, and some assessments may be limited, especially if this visit is performed virtually. We may recommend an in-person follow-up visit with your provider if needed.  Visit Complete: Virtual I connected with  [name] [name] on 10/23/23 by a audio enabled telemedicine application and verified that I am speaking with the correct person using two identifiers.  Patient Location: Home  Provider Location: Home Office  I discussed the limitations of evaluation and management by telemedicine. The patient expressed understanding and agreed to proceed.  Vital Signs: Because this visit was a virtual/telehealth visit, some criteria may be missing or patient reported. Any vitals not documented were not able to be obtained and vitals that have been documented are patient reported.  VideoDeclined- This patient declined Librarian, academic. Therefore the visit was completed with audio only.  Persons Participating in Visit: Patient.  AWV Questionnaire: Yes: Patient Medicare AWV questionnaire was completed by the patient on 10/19/23; I have confirmed that all information answered by patient is correct and no changes since this date.  Cardiac Risk Factors include: advanced age (>46men, >88 women);diabetes mellitus;male gender;sedentary lifestyle;obesity (BMI >30kg/m2)     Objective:    Today's Vitals   10/19/23 2004 10/23/23 0903  Weight:  223 lb (101.2 kg)  Height:  5' 4.5 (1.638 m)  PainSc: 8     Body mass index is 37.69 kg/m.     10/23/2023    9:18 AM 10/14/2022    8:35 AM 10/11/2021   11:44 AM 03/12/2021    1:00 PM 02/28/2021   11:36 AM 04/07/2013    3:58 PM 11/18/2012   11:41 AM  Advanced Directives  Does Patient Have a Medical Advance Directive? Yes Yes Yes Yes Yes Patient has advance directive, copy  not in chart  Patient has advance directive, copy in chart   Type of Advance Directive Healthcare Power of [place];Living will Healthcare Power of [place];Living will Healthcare Power of [place];Living will Healthcare Power of [place];Living will Healthcare Power of [place];Living will Living will    Does patient want to make changes to medical advance directive?  No - Patient declined No - Patient declined No - Patient declined  No change requested    Copy of Healthcare Power of Attorney in Chart? Yes - validated most recent copy scanned in chart (See row information) Yes - validated most recent copy scanned in chart (See row information) Yes - validated most recent copy scanned in chart (See row information) Yes - validated most recent copy scanned in chart (See row information) No - copy requested  --      Data saved with a previous flowsheet row definition    Current Medications (verified) Outpatient Encounter Medications as of 10/23/2023  Medication Sig   acetaminophen  (TYLENOL ) 650 MG CR tablet Take 650 mg by mouth every 8 (eight) hours as needed for pain.   allopurinol  (ZYLOPRIM ) 300 MG tablet TAKE 1 TABLET BY MOUTH EVERY DAY   aspirin  EC 81 MG tablet Take 81 mg by mouth daily. Swallow whole.   cetirizine (ZYRTEC) 10 MG tablet Take 10 mg by mouth daily as needed for allergies.   cromolyn (OPTICROM) 4 % ophthalmic solution Place 1 drop into both eyes in the morning and at bedtime.   docusate sodium  (COLACE) 100 MG capsule Take 100 mg by mouth daily  as needed for moderate constipation.   hydrochlorothiazide  (HYDRODIURIL ) 25 MG tablet Take 1 tablet (25 mg total) by mouth daily.   isosorbide  mononitrate (IMDUR ) 60 MG 24 hr tablet TAKE 1 TABLET BY MOUTH EVERY DAY   JARDIANCE  10 MG TABS tablet TAKE 1 TABLET BY MOUTH DAILY BEFORE BREAKFAST.   labetalol  (NORMODYNE ) 200 MG tablet TAKE 1 TABLET BY MOUTH TWICE A DAY   levothyroxine  (SYNTHROID ) 100 MCG tablet TAKE 1 TABLET BY MOUTH DAILY BEFORE  BREAKFAST.   losartan  (COZAAR ) 100 MG tablet Take 1 tablet (100 mg total) by mouth daily.   methocarbamol  (ROBAXIN ) 500 MG tablet TAKE 1 TABLET BY MOUTH EVERY 6 HOURS AS NEEDED FOR MUSCLE SPASMS.   Multiple Vitamin (MULTIVITAMIN) tablet Take 1 tablet by mouth daily.   Multiple Vitamins-Minerals (PRESERVISION AREDS PO) Take 1 capsule by mouth in the morning and at bedtime.   nitroGLYCERIN  (NITROSTAT ) 0.4 MG SL tablet Place 1 tablet (0.4 mg total) under the tongue every 5 (five) minutes as needed for chest pain. If your symptoms continue after taking two tablets 5 minutes apart call 911.   Omega-3 Fatty Acids (FISH OIL) 1000 MG CAPS Take 1 capsule by mouth daily at 12 noon.   Polyethyl Glycol-Propyl Glycol (SYSTANE OP) Place 1 drop into both eyes daily as needed (dry eyes).   rosuvastatin  (CRESTOR ) 20 MG tablet TAKE 1 TABLET BY MOUTH EVERYDAY AT BEDTIME   spironolactone  (ALDACTONE ) 25 MG tablet TAKE 1 TABLET (25 MG TOTAL) BY MOUTH IN THE MORNING   testosterone  cypionate (DEPOTESTOSTERONE CYPIONATE) 200 MG/ML injection INJECT 1 ML (200 MG TOTAL) INTO THE MUSCLE EVERY 28 (TWENTY-EIGHT) DAYS.   Facility-Administered Encounter Medications as of 10/23/2023  Medication   testosterone  cypionate (DEPOTESTOSTERONE CYPIONATE) injection 200 mg   testosterone  cypionate (DEPOTESTOSTERONE CYPIONATE) injection 200 mg   testosterone  cypionate (DEPOTESTOSTERONE CYPIONATE) injection 200 mg   testosterone  cypionate (DEPOTESTOSTERONE CYPIONATE) injection 200 mg   testosterone  cypionate (DEPOTESTOSTERONE CYPIONATE) injection 200 mg   testosterone  cypionate (DEPOTESTOSTERONE CYPIONATE) injection 200 mg   testosterone  cypionate (DEPOTESTOSTERONE CYPIONATE) injection 200 mg    Allergies (verified) Amlodipine , Ciprofloxacin, Hydralazine , Hydrocodone , Levofloxacin, Oxycodone , Tramadol, and Cardizem [diltiazem hcl]   History: Past Medical History:  Diagnosis Date   Complication of anesthesia    Pt difficult to  intubate; 07/19/03 aborted surgery unable to intubate   Coronary artery disease    Diabetes mellitus without complication (HCC)    Dysthymic disorder    Gout    Hx: of in left wrist   Gout    Hearing deficit    Hx: of wears hearing aids   Hypertension    Kidney stones    Hx: of   Multiple myeloma (HCC)    Occlusion and stenosis of unspecified carotid artery    Pathologic fracture of vertebrae    Pneumonia    Rosacea    Sleep apnea    Hx: of pt wears CPAP   Past Surgical History:  Procedure Laterality Date   APPENDECTOMY     BACK SURGERY  [phone]   lumb   CARDIAC CATHETERIZATION     Hx: of 04/05/10   CAROTID ARTERY - SUBCLAVIAN ARTERY BYPASS GRAFT  2004   Hx: of-left   CARPAL TUNNEL RELEASE Right 04/13/2013   Procedure: RIGHT CARPAL TUNNEL RELEASE;  Surgeon: Lamar LULLA Leonor Mickey., MD;  Location: [facility];  Service: Orthopedics;  Laterality: Right;   CATARACT EXTRACTION     Hx: of left eye only   COLONOSCOPY  COLONOSCOPY W/ BIOPSIES AND POLYPECTOMY     Hx: of   CORONARY ARTERY BYPASS GRAFT  2012   Hx: of 5   ELBOW SURGERY  1992   Hx: of-lt   HERNIA REPAIR  1963   Hx: of right   JOINT REPLACEMENT  2006   Hx: of left knee   LUMBAR LAMINECTOMY/DECOMPRESSION MICRODISCECTOMY N/A 11/18/2012   Procedure: REVISION L2 - L4 DECOMPRESSION DISCECTOMY 2 LEVELS;  Surgeon: Donaciano Sprang, MD;  Location: [facility];  Service: Orthopedics;  Laterality: N/A;   ROTATOR CUFF REPAIR  2003   Hx: of right   TONSILLECTOMY     TOTAL KNEE ARTHROPLASTY Right 03/12/2021   Procedure: TOTAL KNEE ARTHROPLASTY;  Surgeon: Melodi Lerner, MD;  Location: [facility];  Service: Orthopedics;  Laterality: Right;   TRIGGER FINGER RELEASE Right 04/13/2013   Procedure: RIGHT THUMB A-1 PULLEY RELEASE;  Surgeon: Lamar LULLA Leonor Mickey., MD;  Location: [facility];  Service: Orthopedics;  Laterality: Right;   Family History  Problem Relation Age of Onset   Cancer - Colon Mother    Heart  disease Father    Alzheimer's disease Sister    Stroke Sister        6 or 7   Alzheimer's disease Sister    Alzheimer's disease Brother    Dementia Other    Social History   Socioeconomic History   Marital status: Married    Spouse name: Not on file   Number of children: 1   Years of education: college   Highest education level: 12th grade  Occupational History   Occupation: Retired   Tobacco Use   Smoking status: Former    Current packs/day: 0.00    Average packs/day: 0.5 packs/day for 30.0 years (15.0 ttl pk-yrs)    Types: Cigarettes    Start date: 04/07/1948    Quit date: 04/08/1978    Years since quitting: 45.5   Smokeless tobacco: Never   Tobacco comments:    Quit smoking cigarretes in 1980  Vaping Use   Vaping status: Never Used  Substance and Sexual Activity   Alcohol use: No    Alcohol/week: 0.0 standard drinks of alcohol   Drug use: No   Sexual activity: Not on file  Other Topics Concern   Not on file  Social History Narrative   Rare caffeine use    Social Drivers of Corporate investment banker Strain: Low Risk  (10/19/2023)   Overall Financial Resource Strain (CARDIA)    Difficulty of Paying Living Expenses: Not hard at all  Food Insecurity: No Food Insecurity (10/19/2023)   Hunger Vital Sign    Worried About Running Out of Food in the Last Year: Never true    Ran Out of Food in the Last Year: Never true  Transportation Needs: No Transportation Needs (10/19/2023)   PRAPARE - Administrator, Civil Service (Medical): No    Lack of Transportation (Non-Medical): No  Physical Activity: Inactive (10/19/2023)   Exercise Vital Sign    Days of Exercise per Week: 0 days    Minutes of Exercise per Session: Not on file  Stress: No Stress Concern Present (10/19/2023)   [employer] of Occupational Health - Occupational Stress Questionnaire    Feeling of Stress: Only a little  Social Connections: Socially Integrated (10/19/2023)   Social Connection  and Isolation Panel    Frequency of Communication with Friends and Family: More than three times a week    Frequency of  Social Gatherings with Friends and Family: More than three times a week    Attends Religious Services: More than 4 times per year    Active Member of [employer] or Organizations: Yes    Attends Banker Meetings: 1 to 4 times per year    Marital Status: Married    Tobacco Counseling Counseling given: Not Answered Tobacco comments: Quit smoking cigarretes in 1980    Clinical Intake:  Pre-visit preparation completed: Yes  Pain : 0-10 Pain Score: 8  Pain Type: Chronic pain Pain Location: Back Pain Orientation: Lower Pain Descriptors / Indicators: Aching Pain Onset: More than a month ago Pain Frequency: Intermittent Pain Relieving Factors: muscle relaxers,tylenol , Effect of Pain on Daily Activities: slows him down  Pain Relieving Factors: muscle relaxers,tylenol ,  BMI - recorded: 37.69 Nutritional Status: BMI > 30  Obese Nutritional Risks: None Diabetes: Yes CBG done?: No Did pt. bring in CBG monitor from home?: No  Lab Results  Component Value Date   HGBA1C 5.9 (A) 10/16/2023   HGBA1C 5.9 (A) 04/15/2023   HGBA1C 5.9 (A) 10/15/2022     How often do you need to have someone help you when you read instructions, pamphlets, or other written materials from your doctor or pharmacy?: 1 - Never  Interpreter Needed?: No  Comments: lives with wife Information entered by :: B.Marielena Harvell,LPN   Activities of Daily Living     10/19/2023    8:04 PM  In your present state of health, do you have any difficulty performing the following activities:  Hearing? 1  Vision? 0   Difficulty concentrating or making decisions? 1   Walking or climbing stairs? 1   Dressing or bathing? 0   Doing errands, shopping? 0   Preparing Food and eating ? N   Using the Toilet? N   In the past six months, have you accidently leaked urine? Y   Do you have problems with loss  of bowel control? N   Managing your Medications? N   Managing your Finances? N   Housekeeping or managing your Housekeeping? N      Proxy-reported    Patient Care Team: Wendee Lynwood HERO, NP as PCP - General (Nurse Practitioner) Ladona Heinz, MD as PCP - Cardiology (Cardiology) Neysa Reggy BIRCH, MD as Consulting Physician (Pulmonary Disease) Shona Rush, MD (Dermatology) Ladona Heinz, MD as Consulting Physician (Cardiology) Octavia Bruckner, MD as Consulting Physician (Ophthalmology)  I have updated your Care Teams any recent Medical Services you may have received from other providers in the past year.     Assessment:   This is a routine wellness examination for [name] [name].  Hearing/Vision screen Hearing Screening - Comments:: Patient denies any hearing difficulties with hearing aids Vision Screening - Comments:: Pt says their vision is good with glasses/readers Dr  JAYSON Octavia   Goals Addressed   None    Depression Screen     10/23/2023    9:17 AM 10/16/2023    9:16 AM 04/15/2023   10:33 AM 10/14/2022    8:35 AM 04/12/2022    8:52 AM 10/11/2021   11:41 AM 07/13/2021   12:45 PM  PHQ 2/9 Scores  PHQ - 2 Score 0 0 2 0 1 0 2  PHQ- 9 Score  3 8  6  7     Fall Risk     10/23/2023    9:15 AM 10/19/2023    8:04 PM 10/16/2023    9:16 AM 10/16/2023    9:02 AM 04/15/2023  10:33 AM  Fall Risk   Falls in the past year? 1 1  1 1 1   Number falls in past yr:  1  1 0 1  Injury with Fall?  0  0 0 0  Risk for fall due to : Impaired balance/gait;Orthopedic patient  History of fall(s) History of fall(s) No Fall Risks  Follow up Falls prevention discussed;Education provided  Falls evaluation completed Falls evaluation completed Falls evaluation completed     Proxy-reported    MEDICARE RISK AT HOME:  Medicare Risk at Home Any stairs in or around the home?: (Proxy-Rptd) Yes If so, are there any without handrails?: (Proxy-Rptd) No Home free of loose throw rugs in walkways, pet beds, electrical cords,  etc?: (Proxy-Rptd) Yes Adequate lighting in your home to reduce risk of falls?: (Proxy-Rptd) Yes Life alert?: (Proxy-Rptd) No Use of a cane, walker or w/c?: (Proxy-Rptd) No Grab bars in the bathroom?: (Proxy-Rptd) Yes Shower chair or bench in shower?: (Proxy-Rptd) Yes Elevated toilet seat or a handicapped toilet?: (Proxy-Rptd) Yes  TIMED UP AND GO:  Was the test performed?  No  Cognitive Function: 6CIT completed    05/29/2016    2:42 PM 11/30/2015    1:40 PM 06/01/2015    1:40 PM  MMSE - Mini Mental State Exam  Orientation to time 4  4  4    Orientation to Place 4  4  3    Registration 3  3  2    Attention/ Calculation 2  3  5    Recall 2  3  3    Language- name 2 objects 2  2  2    Language- repeat 1 1 1   Language- follow 3 step command 3  3  3    Language- read & follow direction 1  1  1    Write a sentence 1  0  1   Copy design 1  1  1    Total score 24  25  26       Data saved with a previous flowsheet row definition        10/23/2023    9:20 AM 10/14/2022    8:38 AM 10/11/2021   11:45 AM  6CIT Screen  What Year? 4 points 0 points 0 points  What month? 3 points 0 points 0 points  What time? 0 points 0 points 0 points  Count back from 20 0 points 0 points 0 points  Months in reverse 4 points 4 points 0 points  Repeat phrase 6 points 8 points 0 points  Total Score 17 points 12 points 0 points    Immunizations Immunization History  Administered Date(s) Administered    sv, Bivalent, Protein Subunit Rsvpref,pf (Abrysvo) 10/24/2022   Fluad Quad(high Dose 65+) 10/19/2018, 11/05/2021   Fluad Trivalent(High Dose 65+) 10/24/2022   INFLUENZA, HIGH DOSE SEASONAL PF 10/06/2016   Influenza,inj,quad, With Preservative 10/28/2016, 09/28/2017, 09/29/2018   Influenza-Unspecified 10/02/2016   Moderna Covid-19 Fall Seasonal Vaccine 68yrs & older 10/24/2022   PFIZER Comirnaty(Gray Top)Covid-19 Tri-Sucrose Vaccine 08/08/2020   PFIZER(Purple Top)SARS-COV-2 Vaccination 02/12/2019, 03/05/2019,  11/22/2019, 11/03/2020   Pfizer Covid-19 Vaccine Bivalent Booster 59yrs & up 08/08/2020, 11/03/2020   Pfizer(Comirnaty)Fall Seasonal Vaccine 12 years and older 11/05/2021   Pneumococcal Polysaccharide-23 11/20/2012   Pneumococcal-Unspecified 01/29/2016   Zoster Recombinant(Shingrix) 05/22/2023, 07/24/2023    Screening Tests Health Maintenance  Topic Date Due   DTaP/Tdap/Td (1 - Tdap) Never done   Pneumococcal Vaccine: 50+ Years (2 of 2 - PCV) 01/28/2017   Influenza Vaccine  08/29/2023  COVID-19 Vaccine (8 - Pfizer risk 2024-25 season) 09/29/2023   FOOT EXAM  04/14/2024   HEMOGLOBIN A1C  04/14/2024   OPHTHALMOLOGY EXAM  08/19/2024   Medicare Annual Wellness (AWV)  10/22/2024   Zoster Vaccines- Shingrix  Completed   HPV VACCINES  Aged Out   Meningococcal B Vaccine  Aged Out    Health Maintenance Items Addressed: None due at this time. Pt will receive vaccines at their pharmcy or PCP when decided to obtain   Additional Screening:  Vision Screening: Recommended annual ophthalmology exams for early detection of glaucoma and other disorders of the eye. Is the patient up to date with their annual eye exam?  Yes  Who is the provider or what is the name of the office in which the patient attends annual eye exams? Dr JAYSON Gaudy  Dental Screening: Recommended annual dental exams for proper oral hygiene  Community Resource Referral / Chronic Care Management: CRR required this visit?  No   CCM required this visit?  Appt scheduled with PCP   Plan:    I have personally reviewed and noted the following in the patient's chart:   Medical and social history Use of alcohol, tobacco or illicit drugs  Current medications and supplements including opioid prescriptions. Patient is not currently taking opioid prescriptions. Functional ability and status Nutritional status Physical activity Advanced directives List of other physicians Hospitalizations, surgeries, and ER visits in previous  12 months Vitals Screenings to include cognitive, depression, and falls Referrals and appointments  In addition, I have reviewed and discussed with patient certain preventive protocols, quality metrics, and best practice recommendations. A written personalized care plan for preventive services as well as general preventive health recommendations were provided to patient.   [name] LITTIE Saris, LPN   [id number]   After Visit Summary: (MyChart) Due to this being a telephonic visit, the after visit summary with patients personalized plan was offered to patient via MyChart   Notes: Please refer to Routing Comments.

## 2023-10-23 NOTE — Patient Instructions (Signed)
 [name] [name],  Thank you for taking the time for your Medicare Wellness Visit. I appreciate your continued commitment to your health goals. Please review the care plan we discussed, and feel free to reach out if I can assist you further.  Medicare recommends these wellness visits once per year to help you and your care team stay ahead of potential health issues. These visits are designed to focus on prevention, allowing your provider to concentrate on managing your acute and chronic conditions during your regular appointments.  Please note that Annual Wellness Visits do not include a physical exam. Some assessments may be limited, especially if the visit was conducted virtually. If needed, we may recommend a separate in-person follow-up with your provider.  Ongoing Care Seeing your primary care provider every 3 to 6 months helps us  monitor your health and provide consistent, personalized care.   Referrals If a referral was made during today's visit and you haven't received any updates within two weeks, please contact the referred provider directly to check on the status.  Recommended Screenings:  Health Maintenance  Topic Date Due   Eye exam for diabetics  Never done   DTaP/Tdap/Td vaccine (1 - Tdap) Never done   Pneumococcal Vaccine for age over 73 (2 of 2 - PCV) 01/28/2017   Flu Shot  08/29/2023   COVID-19 Vaccine (8 - Pfizer risk 2024-25 season) 09/29/2023   Complete foot exam   04/14/2024   Hemoglobin A1C  04/14/2024   Medicare Annual Wellness Visit  10/22/2024   Zoster (Shingles) Vaccine  Completed   HPV Vaccine  Aged Out   Meningitis B Vaccine  Aged Out       10/14/2022    8:35 AM  Advanced Directives  Does Patient Have a Medical Advance Directive? Yes  Type of Estate agent of [place];Living will  Does patient want to make changes to medical advance directive? No - Patient declined  Copy of Healthcare Power of Attorney in Chart? Yes - validated most  recent copy scanned in chart (See row information)   Advance Care Planning is important because it: Ensures you receive medical care that aligns with your values, goals, and preferences. Provides guidance to your family and loved ones, reducing the emotional burden of decision-making during critical moments.  Vision: Annual vision screenings are recommended for early detection of glaucoma, cataracts, and diabetic retinopathy. These exams can also reveal signs of chronic conditions such as diabetes and high blood pressure.  Dental: Annual dental screenings help detect early signs of oral cancer, gum disease, and other conditions linked to overall health, including heart disease and diabetes.  Please see the attached documents for additional preventive care recommendations.

## 2023-10-24 DIAGNOSIS — Z08 Encounter for follow-up examination after completed treatment for malignant neoplasm: Secondary | ICD-10-CM | POA: Diagnosis not present

## 2023-10-24 DIAGNOSIS — Z85820 Personal history of malignant melanoma of skin: Secondary | ICD-10-CM | POA: Diagnosis not present

## 2023-10-24 DIAGNOSIS — Z1283 Encounter for screening for malignant neoplasm of skin: Secondary | ICD-10-CM | POA: Diagnosis not present

## 2023-10-24 DIAGNOSIS — D225 Melanocytic nevi of trunk: Secondary | ICD-10-CM | POA: Diagnosis not present

## 2023-10-24 DIAGNOSIS — L570 Actinic keratosis: Secondary | ICD-10-CM | POA: Diagnosis not present

## 2023-10-24 DIAGNOSIS — X32XXXD Exposure to sunlight, subsequent encounter: Secondary | ICD-10-CM | POA: Diagnosis not present

## 2023-11-14 ENCOUNTER — Other Ambulatory Visit: Payer: Self-pay | Admitting: Cardiology

## 2023-11-14 DIAGNOSIS — I10 Essential (primary) hypertension: Secondary | ICD-10-CM

## 2023-11-20 ENCOUNTER — Ambulatory Visit

## 2023-11-20 DIAGNOSIS — E291 Testicular hypofunction: Secondary | ICD-10-CM | POA: Diagnosis not present

## 2023-11-20 MED ORDER — TESTOSTERONE CYPIONATE 200 MG/ML IM SOLN
200.0000 mg | Freq: Once | INTRAMUSCULAR | Status: AC
  Administered 2023-11-20: 200 mg via INTRAMUSCULAR

## 2023-11-20 NOTE — Progress Notes (Signed)
 Per orders of [employer] DPN AGNP-C, injection of testosterone  200 mg given by Laray Arenas in right anterioir thigh. Patient tolerated injection well. Patient will make appointment for 1 month.

## 2023-12-04 DIAGNOSIS — N1832 Chronic kidney disease, stage 3b: Secondary | ICD-10-CM | POA: Diagnosis not present

## 2023-12-04 DIAGNOSIS — Z8249 Family history of ischemic heart disease and other diseases of the circulatory system: Secondary | ICD-10-CM | POA: Diagnosis not present

## 2023-12-04 DIAGNOSIS — E039 Hypothyroidism, unspecified: Secondary | ICD-10-CM | POA: Diagnosis not present

## 2023-12-04 DIAGNOSIS — E785 Hyperlipidemia, unspecified: Secondary | ICD-10-CM | POA: Diagnosis not present

## 2023-12-04 DIAGNOSIS — Z87891 Personal history of nicotine dependence: Secondary | ICD-10-CM | POA: Diagnosis not present

## 2023-12-04 DIAGNOSIS — I129 Hypertensive chronic kidney disease with stage 1 through stage 4 chronic kidney disease, or unspecified chronic kidney disease: Secondary | ICD-10-CM | POA: Diagnosis not present

## 2023-12-04 DIAGNOSIS — E1122 Type 2 diabetes mellitus with diabetic chronic kidney disease: Secondary | ICD-10-CM | POA: Diagnosis not present

## 2023-12-04 DIAGNOSIS — Z7982 Long term (current) use of aspirin: Secondary | ICD-10-CM | POA: Diagnosis not present

## 2023-12-11 ENCOUNTER — Other Ambulatory Visit: Payer: Self-pay | Admitting: Cardiology

## 2023-12-11 ENCOUNTER — Encounter: Payer: Self-pay | Admitting: Oncology

## 2023-12-11 ENCOUNTER — Other Ambulatory Visit: Payer: Self-pay | Admitting: Family

## 2023-12-11 DIAGNOSIS — I10 Essential (primary) hypertension: Secondary | ICD-10-CM

## 2023-12-17 ENCOUNTER — Ambulatory Visit (INDEPENDENT_AMBULATORY_CARE_PROVIDER_SITE_OTHER)

## 2023-12-17 DIAGNOSIS — E291 Testicular hypofunction: Secondary | ICD-10-CM | POA: Diagnosis not present

## 2023-12-17 MED ORDER — TESTOSTERONE CYPIONATE 200 MG/ML IM SOLN
200.0000 mg | Freq: Once | INTRAMUSCULAR | Status: AC
  Administered 2023-12-17: 200 mg via INTRAMUSCULAR

## 2023-12-17 NOTE — Telephone Encounter (Signed)
 Prescription Request  12/17/2023  LOV: saw CHRISTELLA Crandall NP on 10/16/23  What is the name of the medication or equipment? Methocarbamol  500 mg last refilled # 40 on 09/26/23 pt said uses for sciatica pain.,  Have you contacted your pharmacy to request a refill? Yes   Which pharmacy would you like this sent to?  [id number] [address], [place] - [address] [place] [address] Phone: [phone] Fax: [phone]    Patient notified that their request is being sent to the clinical staff for review and that they should receive a response within 2 business days.   Please advise pt at [phone]

## 2023-12-17 NOTE — Progress Notes (Signed)
 Per orders of [employer] DPN AGNP-C, injection of testosterone  200 mg IM given by Laray Arenas in left anterior thigh. Patient tolerated injection well. Patient will make appointment for 1 month.

## 2024-01-07 ENCOUNTER — Other Ambulatory Visit: Payer: Self-pay | Admitting: Nurse Practitioner

## 2024-01-07 ENCOUNTER — Other Ambulatory Visit: Payer: Self-pay | Admitting: Cardiology

## 2024-01-07 DIAGNOSIS — D472 Monoclonal gammopathy: Secondary | ICD-10-CM

## 2024-01-15 ENCOUNTER — Ambulatory Visit

## 2024-01-15 DIAGNOSIS — E291 Testicular hypofunction: Secondary | ICD-10-CM

## 2024-01-15 MED ORDER — TESTOSTERONE CYPIONATE 200 MG/ML IM SOLN
200.0000 mg | Freq: Once | INTRAMUSCULAR | Status: AC
  Administered 2024-01-15: 10:00:00 200 mg via INTRAMUSCULAR

## 2024-01-15 NOTE — Progress Notes (Cosign Needed)
 Per orders of [employer] DPN AGNP-C, injection of testosterone  200 mg IM given by Laray Arenas in right anterior thigh. Patient tolerated injection well. Patient will make appointment for 1 month.

## 2024-02-04 ENCOUNTER — Ambulatory Visit

## 2024-02-09 ENCOUNTER — Other Ambulatory Visit: Payer: Self-pay | Admitting: Nurse Practitioner

## 2024-02-09 DIAGNOSIS — E291 Testicular hypofunction: Secondary | ICD-10-CM

## 2024-02-10 NOTE — Telephone Encounter (Unsigned)
 Copied from CRM #[id number]. Topic: Clinical - Prescription Issue >> Feb 10, 2024  8:23 AM Eva FALCON wrote: Reason for CRM: Pt is coming in tomorrow for a injection appointment. States the Testosterone  is not at pharmacy so they can pick it up for the appointment tomorrow. Was wondering if it could be sent today so pts wife can pick up and he can come  to appointment. Or if the appointment needs to be delayed, please call wife Delayne [phone].

## 2024-02-11 ENCOUNTER — Ambulatory Visit

## 2024-02-12 ENCOUNTER — Other Ambulatory Visit: Payer: Self-pay | Admitting: Nurse Practitioner

## 2024-02-12 ENCOUNTER — Ambulatory Visit

## 2024-02-12 DIAGNOSIS — I2510 Atherosclerotic heart disease of native coronary artery without angina pectoris: Secondary | ICD-10-CM

## 2024-02-17 ENCOUNTER — Ambulatory Visit

## 2024-02-17 DIAGNOSIS — E291 Testicular hypofunction: Secondary | ICD-10-CM | POA: Diagnosis not present

## 2024-02-17 MED ORDER — TESTOSTERONE CYPIONATE 200 MG/ML IM SOLN
200.0000 mg | Freq: Once | INTRAMUSCULAR | Status: AC
  Administered 2024-02-17: 200 mg via INTRAMUSCULAR

## 2024-02-17 NOTE — Progress Notes (Signed)
 Per orders of [employer] DPN AGNP-C, injection of Testosterone   given by Danna CINDERELLA Hummer in Left anterior thigh  Patient tolerated injection well. Patient will make appointment for 1 month(s)

## 2024-02-18 ENCOUNTER — Ambulatory Visit: Admitting: Pulmonary Disease

## 2024-02-18 ENCOUNTER — Encounter: Payer: Self-pay | Admitting: Pulmonary Disease

## 2024-02-18 VITALS — BP 126/58 | HR 72 | Temp 97.6°F | Ht 66.0 in | Wt 233.4 lb

## 2024-02-18 DIAGNOSIS — G4733 Obstructive sleep apnea (adult) (pediatric): Secondary | ICD-10-CM

## 2024-02-18 DIAGNOSIS — Z87891 Personal history of nicotine dependence: Secondary | ICD-10-CM

## 2024-02-18 MED ORDER — FLUTICASONE PROPIONATE 50 MCG/ACT NA SUSP: 2.0000 | Freq: Every day | NASAL | 2 refills | Status: AC

## 2024-02-18 NOTE — Progress Notes (Signed)
 "              [name] [name]    [id number]    Jan 27, 1933  Primary Care Physician:Cable, Lynwood HERO, NP  Referring Physician: Wendee Lynwood HERO, NP [address] [place],  [place] [address]  Chief complaint:   Patient is being seen for obstructive sleep apnea   HPI:  He has used CPAP for over 25 years Recently having some problems with his mask feels it is leaking  He uses a nose mask His current machine is about 89 years old  Usually goes to bed between 11 and 12, takes him about an hour or more to fall asleep Wakes up about 2-3 times final wake up time between about 7 AM  Weight is down about 40 pounds  He has a history of heart disease, coronary artery disease, high blood pressure, prediabetes seasonal allergies  He does try to use a CPAP on a nightly basis  Has some nasal stuffiness, congestion during the day and sometimes his nose runs  Outpatient Encounter Medications as of 02/18/2024  Medication Sig   acetaminophen  (TYLENOL ) 650 MG CR tablet Take 650 mg by mouth every 8 (eight) hours as needed for pain.   allopurinol  (ZYLOPRIM ) 300 MG tablet TAKE 1 TABLET BY MOUTH EVERY DAY   aspirin  EC 81 MG tablet Take 81 mg by mouth daily. Swallow whole.   cetirizine (ZYRTEC) 10 MG tablet Take 10 mg by mouth daily as needed for allergies.   cromolyn (OPTICROM) 4 % ophthalmic solution Place 1 drop into both eyes in the morning and at bedtime.   docusate sodium  (COLACE) 100 MG capsule Take 100 mg by mouth daily as needed for moderate constipation.   fluticasone  (FLONASE ) 50 MCG/ACT nasal spray Place 2 sprays into both nostrils daily.   hydrochlorothiazide  (HYDRODIURIL ) 25 MG tablet Take 1 tablet (25 mg total) by mouth daily.   isosorbide  mononitrate (IMDUR ) 60 MG 24 hr tablet TAKE 1 TABLET BY MOUTH EVERY DAY   JARDIANCE  10 MG TABS tablet TAKE 1 TABLET BY MOUTH DAILY BEFORE BREAKFAST.   labetalol  (NORMODYNE ) 200 MG tablet TAKE 1 TABLET BY MOUTH TWICE A DAY   levothyroxine  (SYNTHROID ) 100  MCG tablet TAKE 1 TABLET BY MOUTH DAILY BEFORE BREAKFAST.   losartan  (COZAAR ) 100 MG tablet TAKE 1 TABLET BY MOUTH EVERY DAY   methocarbamol  (ROBAXIN ) 500 MG tablet TAKE 1 TABLET BY MOUTH EVERY 6 HOURS AS NEEDED FOR MUSCLE SPASMS.   Multiple Vitamin (MULTIVITAMIN) tablet Take 1 tablet by mouth daily.   Multiple Vitamins-Minerals (PRESERVISION AREDS PO) Take 1 capsule by mouth in the morning and at bedtime.   nitroGLYCERIN  (NITROSTAT ) 0.4 MG SL tablet Place 1 tablet (0.4 mg total) under the tongue every 5 (five) minutes as needed for chest pain. If your symptoms continue after taking two tablets 5 minutes apart call 911.   Omega-3 Fatty Acids (FISH OIL) 1000 MG CAPS Take 1 capsule by mouth daily at 12 noon.   Polyethyl Glycol-Propyl Glycol (SYSTANE OP) Place 1 drop into both eyes daily as needed (dry eyes).   rosuvastatin  (CRESTOR ) 20 MG tablet TAKE 1 TABLET BY MOUTH EVERYDAY AT BEDTIME   spironolactone  (ALDACTONE ) 25 MG tablet TAKE 1 TABLET (25 MG TOTAL) BY MOUTH IN THE MORNING   testosterone  cypionate (DEPOTESTOSTERONE CYPIONATE) 200 MG/ML injection INJECT 1 ML (200 MG TOTAL) INTO THE MUSCLE EVERY 28 (TWENTY-EIGHT) DAYS.   Facility-Administered Encounter Medications as of 02/18/2024  Medication   testosterone  cypionate (DEPOTESTOSTERONE  CYPIONATE) injection 200 mg   testosterone  cypionate (DEPOTESTOSTERONE CYPIONATE) injection 200 mg   testosterone  cypionate (DEPOTESTOSTERONE CYPIONATE) injection 200 mg   testosterone  cypionate (DEPOTESTOSTERONE CYPIONATE) injection 200 mg   testosterone  cypionate (DEPOTESTOSTERONE CYPIONATE) injection 200 mg   testosterone  cypionate (DEPOTESTOSTERONE CYPIONATE) injection 200 mg   testosterone  cypionate (DEPOTESTOSTERONE CYPIONATE) injection 200 mg    Allergies as of 02/18/2024 - Review Complete 02/18/2024  Allergen Reaction Noted   Amlodipine  Swelling 10/08/2019   Ciprofloxacin Nausea Only 06/01/2015   Hydralazine  Other (See Comments) 07/07/2019    Hydrocodone  Nausea Only 06/17/2011   Levofloxacin Other (See Comments) 06/01/2015   Oxycodone  Nausea Only 06/17/2011   Tramadol Nausea Only 06/17/2011   Cardizem [diltiazem hcl] Rash 06/17/2011    Past Medical History:  Diagnosis Date   Complication of anesthesia    Pt difficult to intubate; 07/19/03 aborted surgery unable to intubate   Coronary artery disease    Diabetes mellitus without complication (HCC)    Dysthymic disorder    Gout    Hx: of in left wrist   Gout    Hearing deficit    Hx: of wears hearing aids   Hypertension    Kidney stones    Hx: of   Multiple myeloma (HCC)    Occlusion and stenosis of unspecified carotid artery    Pathologic fracture of vertebrae    Pneumonia    Rosacea    Sleep apnea    Hx: of pt wears CPAP    Past Surgical History:  Procedure Laterality Date   APPENDECTOMY     BACK SURGERY  [phone]   lumb   CARDIAC CATHETERIZATION     Hx: of 04/05/10   CAROTID ARTERY - SUBCLAVIAN ARTERY BYPASS GRAFT  2004   Hx: of-left   CARPAL TUNNEL RELEASE Right 04/13/2013   Procedure: RIGHT CARPAL TUNNEL RELEASE;  Surgeon: Lamar LULLA Leonor Mickey., MD;  Location: [facility];  Service: Orthopedics;  Laterality: Right;   CATARACT EXTRACTION     Hx: of left eye only   COLONOSCOPY     COLONOSCOPY W/ BIOPSIES AND POLYPECTOMY     Hx: of   CORONARY ARTERY BYPASS GRAFT  2012   Hx: of 5   ELBOW SURGERY  1992   Hx: of-lt   HERNIA REPAIR  1963   Hx: of right   JOINT REPLACEMENT  2006   Hx: of left knee   LUMBAR LAMINECTOMY/DECOMPRESSION MICRODISCECTOMY N/A 11/18/2012   Procedure: REVISION L2 - L4 DECOMPRESSION DISCECTOMY 2 LEVELS;  Surgeon: Donaciano Sprang, MD;  Location: [facility];  Service: Orthopedics;  Laterality: N/A;   ROTATOR CUFF REPAIR  2003   Hx: of right   TONSILLECTOMY     TOTAL KNEE ARTHROPLASTY Right 03/12/2021   Procedure: TOTAL KNEE ARTHROPLASTY;  Surgeon: Melodi Lerner, MD;  Location: [facility];  Service: Orthopedics;  Laterality: Right;    TRIGGER FINGER RELEASE Right 04/13/2013   Procedure: RIGHT THUMB A-1 PULLEY RELEASE;  Surgeon: Lamar LULLA Leonor Mickey., MD;  Location: [facility];  Service: Orthopedics;  Laterality: Right;    Family History  Problem Relation Age of Onset   Cancer - Colon Mother    Heart disease Father    Alzheimer's disease Sister    Stroke Sister        6 or 7   Alzheimer's disease Sister    Alzheimer's disease Brother    Dementia Other     Social History   Socioeconomic History   Marital status:  Married    Spouse name: Not on file   Number of children: 1   Years of education: college   Highest education level: 12th grade  Occupational History   Occupation: Retired   Tobacco Use   Smoking status: Former    Current packs/day: 0.00    Average packs/day: 0.5 packs/day for 30.0 years (15.0 ttl pk-yrs)    Types: Cigarettes    Start date: 04/07/1948    Quit date: 04/08/1978    Years since quitting: 45.8   Smokeless tobacco: Never   Tobacco comments:    Quit smoking cigarretes in 1980  Vaping Use   Vaping status: Never Used  Substance and Sexual Activity   Alcohol use: No    Alcohol/week: 0.0 standard drinks of alcohol   Drug use: No   Sexual activity: Not on file  Other Topics Concern   Not on file  Social History Narrative   Rare caffeine use    Social Drivers of Health   Tobacco Use: Medium Risk (02/18/2024)   Patient History    Smoking Tobacco Use: Former    Smokeless Tobacco Use: Never    Passive Exposure: Not on Actuary Strain: Low Risk (10/19/2023)   Overall Financial Resource Strain (CARDIA)    Difficulty of Paying Living Expenses: Not hard at all  Food Insecurity: No Food Insecurity (10/19/2023)   Epic    Worried About Radiation Protection Practitioner of Food in the Last Year: Never true    Ran Out of Food in the Last Year: Never true  Transportation Needs: No Transportation Needs (10/19/2023)   Epic    Lack of Transportation (Medical): No    Lack of  Transportation (Non-Medical): No  Physical Activity: Inactive (10/19/2023)   Exercise Vital Sign    Days of Exercise per Week: 0 days    Minutes of Exercise per Session: Not on file  Stress: No Stress Concern Present (10/19/2023)   [employer] of Occupational Health - Occupational Stress Questionnaire    Feeling of Stress: Only a little  Social Connections: Socially Integrated (10/19/2023)   Social Connection and Isolation Panel    Frequency of Communication with Friends and Family: More than three times a week    Frequency of Social Gatherings with Friends and Family: More than three times a week    Attends Religious Services: More than 4 times per year    Active Member of Clubs or Organizations: Yes    Attends Banker Meetings: 1 to 4 times per year    Marital Status: Married  Catering Manager Violence: Not At Risk (10/23/2023)   Epic    Fear of Current or Ex-Partner: No    Emotionally Abused: No    Physically Abused: No    Sexually Abused: No  Depression (PHQ2-9): Low Risk (10/23/2023)   Depression (PHQ2-9)    PHQ-2 Score: 0  Alcohol Screen: Low Risk (10/11/2021)   Alcohol Screen    Last Alcohol Screening Score (AUDIT): 0  Housing: Low Risk (10/19/2023)   Epic    Unable to Pay for Housing in the Last Year: No    Number of Times Moved in the Last Year: 0    Homeless in the Last Year: No  Utilities: Not At Risk (10/23/2023)   Epic    Threatened with loss of utilities: No  Health Literacy: Adequate Health Literacy (10/23/2023)   B1300 Health Literacy    Frequency of need for help with medical instructions: Never    Review  of Systems  Respiratory:  Positive for apnea and shortness of breath.   Psychiatric/Behavioral:  Positive for sleep disturbance.     Vitals:   02/18/24 1259  BP: (!) 126/58  Pulse: 72  Temp: 97.6 F (36.4 C)  SpO2: 99%     Physical Exam Constitutional:      Appearance: He is obese.  HENT:     Head: Normocephalic.     Nose: Nose  normal.     Mouth/Throat:     Mouth: Mucous membranes are moist.  Eyes:     General: No scleral icterus.    Pupils: Pupils are equal, round, and reactive to light.  Cardiovascular:     Rate and Rhythm: Normal rate and regular rhythm.     Heart sounds: No murmur heard. Pulmonary:     Effort: No respiratory distress.     Breath sounds: No stridor. No wheezing or rhonchi.  Musculoskeletal:     Cervical back: No rigidity or tenderness.  Skin:    General: Skin is warm.  Neurological:     General: No focal deficit present.     Mental Status: He is alert.  Psychiatric:        Mood and Affect: Mood normal.    Data Reviewed: Compliance data reviewed: Compliance of 100% Average use of 5 hours 31 minutes AutoSet 04-2093 percentile pressure of 14 maximum pressure 15.3 residual AHI of 5.3  Last sleep study was in July 2021 showing an AHI of 33  Assessment/Plan: Severe obstructive sleep apnea which appears adequately treated with CPAP therapy at the present time Having some mask issues, nasal stuffiness and congestion Some leak  Encouraged him to use nasal steroids, prescription for Flonase  was provided  Will tighten his pressures from 4-10 to 10-15  Plan for follow-up in about 3 months  Encouraged to give us  a call with any significant concerns  He will be due for a new machine about middle of the year  Encouraged to call with significant concerns  Jennet Epley MD [facility] 02/18/2024, 3:13 PM  CC: Wendee Lynwood HERO, NP   "

## 2024-02-18 NOTE — Patient Instructions (Addendum)
 I will see you back in about 3 months  Continue using your CPAP on a nightly basis  We will send an order to the medical supply company to tighten the pressure from 5-20 to 10-15   This is about the pressures that the machine maintains most of the time  You will be due a new machine in the middle of the year, we will be seeing you before that is due  Call with significant concerns  Use nasal steroids to help keep nasal congestion at bay-I did put in a prescription for it, check the cost out of the big stores as well to see whether you can get it cheaper

## 2024-03-18 ENCOUNTER — Ambulatory Visit

## 2024-04-14 ENCOUNTER — Ambulatory Visit: Admitting: Nurse Practitioner

## 2024-05-03 ENCOUNTER — Ambulatory Visit: Admitting: Cardiology

## 2024-05-28 ENCOUNTER — Ambulatory Visit: Admitting: Pulmonary Disease

## 2024-10-25 ENCOUNTER — Ambulatory Visit
